# Patient Record
Sex: Female | Born: 1945
Health system: Southern US, Community
[De-identification: ages and names within clinical notes are randomized; demographics above are authoritative.]

## PROBLEM LIST (undated history)

## (undated) DIAGNOSIS — R351 Nocturia: Secondary | ICD-10-CM

## (undated) DIAGNOSIS — E669 Obesity, unspecified: Secondary | ICD-10-CM

## (undated) DIAGNOSIS — Z8719 Personal history of other diseases of the digestive system: Secondary | ICD-10-CM

## (undated) DIAGNOSIS — F419 Anxiety disorder, unspecified: Secondary | ICD-10-CM

## (undated) DIAGNOSIS — Z86718 Personal history of other venous thrombosis and embolism: Secondary | ICD-10-CM

## (undated) DIAGNOSIS — R922 Inconclusive mammogram: Secondary | ICD-10-CM

## (undated) DIAGNOSIS — D649 Anemia, unspecified: Secondary | ICD-10-CM

## (undated) DIAGNOSIS — D219 Benign neoplasm of connective and other soft tissue, unspecified: Secondary | ICD-10-CM

## (undated) DIAGNOSIS — M152 Bouchard's nodes (with arthropathy): Secondary | ICD-10-CM

## (undated) DIAGNOSIS — Z124 Encounter for screening for malignant neoplasm of cervix: Secondary | ICD-10-CM

## (undated) DIAGNOSIS — E119 Type 2 diabetes mellitus without complications: Secondary | ICD-10-CM

## (undated) DIAGNOSIS — M47816 Spondylosis without myelopathy or radiculopathy, lumbar region: Secondary | ICD-10-CM

## (undated) DIAGNOSIS — G4700 Insomnia, unspecified: Secondary | ICD-10-CM

## (undated) DIAGNOSIS — I10 Essential (primary) hypertension: Secondary | ICD-10-CM

## (undated) DIAGNOSIS — R002 Palpitations: Secondary | ICD-10-CM

## (undated) DIAGNOSIS — I739 Peripheral vascular disease, unspecified: Secondary | ICD-10-CM

## (undated) DIAGNOSIS — E785 Hyperlipidemia, unspecified: Secondary | ICD-10-CM

## (undated) HISTORY — DX: Obesity, unspecified: E66.9

## (undated) HISTORY — DX: Encounter for screening for malignant neoplasm of cervix: Z12.4

## (undated) HISTORY — DX: Personal history of other diseases of the digestive system: Z87.19

## (undated) HISTORY — DX: Palpitations: R00.2

## (undated) HISTORY — DX: Essential (primary) hypertension: I10

## (undated) HISTORY — PX: EYE SURGERY: SHX253

## (undated) HISTORY — DX: Inconclusive mammogram: R92.2

## (undated) HISTORY — DX: Peripheral vascular disease, unspecified: I73.9

## (undated) HISTORY — DX: Benign neoplasm of connective and other soft tissue, unspecified: D21.9

## (undated) HISTORY — DX: Nocturia: R35.1

## (undated) HISTORY — PX: COLONOSCOPY: SHX174

## (undated) HISTORY — DX: Anxiety disorder, unspecified: F41.9

## (undated) HISTORY — PX: ABDOMINAL HYSTERECTOMY: SHX81

## (undated) HISTORY — DX: Type 2 diabetes mellitus without complications: E11.9

## (undated) HISTORY — DX: Personal history of other venous thrombosis and embolism: Z86.718

## (undated) HISTORY — DX: Anemia, unspecified: D64.9

## (undated) HISTORY — PX: CHOLECYSTECTOMY: SHX55

## (undated) HISTORY — DX: Hyperlipidemia, unspecified: E78.5

## (undated) HISTORY — DX: Spondylosis without myelopathy or radiculopathy, lumbar region: M47.816

## (undated) HISTORY — PX: BREAST CYST ASPIRATION: SHX578

## (undated) HISTORY — DX: Insomnia, unspecified: G47.00

## (undated) HISTORY — PX: KNEE ARTHROSCOPY: SHX127

## (undated) HISTORY — DX: Bouchard's nodes (with arthropathy): M15.2

---

## 1992-10-18 DIAGNOSIS — Z86718 Personal history of other venous thrombosis and embolism: Secondary | ICD-10-CM

## 1992-10-18 HISTORY — DX: Personal history of other venous thrombosis and embolism: Z86.718

## 2005-11-11 LAB — HM COLONOSCOPY: HM Colonoscopy: NORMAL

## 2007-02-23 ENCOUNTER — Encounter: Admission: RE | Admit: 2007-02-23 | Discharge: 2007-02-23 | Payer: Self-pay | Admitting: Internal Medicine

## 2007-04-18 ENCOUNTER — Ambulatory Visit: Payer: Self-pay | Admitting: Internal Medicine

## 2007-04-18 LAB — CONVERTED CEMR LAB
Alkaline Phosphatase: 102 units/L (ref 39–117)
Basophils Absolute: 0 10*3/uL (ref 0.0–0.1)
Basophils Relative: 0.5 % (ref 0.0–1.0)
CO2: 30 meq/L (ref 19–32)
Chloride: 111 meq/L (ref 96–112)
Creatinine, Ser: 0.8 mg/dL (ref 0.4–1.2)
Direct LDL: 135.4 mg/dL
Eosinophils Absolute: 0.1 10*3/uL (ref 0.0–0.6)
Eosinophils Relative: 2.6 % (ref 0.0–5.0)
GFR calc non Af Amer: 78 mL/min
HCT: 37.5 % (ref 36.0–46.0)
Ketones, ur: NEGATIVE mg/dL
MCHC: 34 g/dL (ref 30.0–36.0)
MCV: 91.2 fL (ref 78.0–100.0)
Monocytes Absolute: 0.6 10*3/uL (ref 0.2–0.7)
Monocytes Relative: 10.4 % (ref 3.0–11.0)
Platelets: 252 10*3/uL (ref 150–400)
RDW: 13.3 % (ref 11.5–14.6)
Sodium: 143 meq/L (ref 135–145)
Specific Gravity, Urine: <=1.005 (ref 1.000–1.03)
Total CHOL/HDL Ratio: 4.4
Total Protein, Urine: NEGATIVE mg/dL
Total Protein: 7.5 g/dL (ref 6.0–8.3)
WBC: 5.5 10*3/uL (ref 4.5–10.5)

## 2007-05-08 ENCOUNTER — Ambulatory Visit: Payer: Self-pay | Admitting: Internal Medicine

## 2007-05-08 ENCOUNTER — Ambulatory Visit: Payer: Self-pay

## 2007-05-22 ENCOUNTER — Ambulatory Visit: Payer: Self-pay | Admitting: Internal Medicine

## 2007-05-22 LAB — CONVERTED CEMR LAB
ALT: 23 units/L (ref 0–35)
AST: 28 units/L (ref 0–37)
Cholesterol: 175 mg/dL (ref 0–200)
HDL: 46.8 mg/dL (ref >39.0)
Triglycerides: 132 mg/dL (ref 0–149)

## 2007-08-07 ENCOUNTER — Encounter: Payer: Self-pay | Admitting: Internal Medicine

## 2007-08-07 ENCOUNTER — Ambulatory Visit: Payer: Self-pay | Admitting: Internal Medicine

## 2007-08-07 DIAGNOSIS — M1710 Unilateral primary osteoarthritis, unspecified knee: Secondary | ICD-10-CM | POA: Insufficient documentation

## 2007-08-07 DIAGNOSIS — M76899 Other specified enthesopathies of unspecified lower limb, excluding foot: Secondary | ICD-10-CM

## 2007-08-07 DIAGNOSIS — M19049 Primary osteoarthritis, unspecified hand: Secondary | ICD-10-CM | POA: Insufficient documentation

## 2007-08-07 DIAGNOSIS — M479 Spondylosis, unspecified: Secondary | ICD-10-CM | POA: Insufficient documentation

## 2007-08-07 DIAGNOSIS — Z9889 Other specified postprocedural states: Secondary | ICD-10-CM

## 2007-08-07 DIAGNOSIS — IMO0002 Reserved for concepts with insufficient information to code with codable children: Secondary | ICD-10-CM | POA: Insufficient documentation

## 2007-08-07 LAB — CONVERTED CEMR LAB
ALT: 26 units/L (ref 0–35)
AST: 27 units/L (ref 0–37)
Alkaline Phosphatase: 99 units/L (ref 39–117)
Bilirubin, Direct: 0.1 mg/dL (ref 0.0–0.3)
Cholesterol: 196 mg/dL (ref 0–200)
HDL: 49.8 mg/dL (ref >39.0)
Total Bilirubin: 0.7 mg/dL (ref 0.3–1.2)
Total CHOL/HDL Ratio: 3.9
Total Protein: 7.6 g/dL (ref 6.0–8.3)
Triglycerides: 135 mg/dL (ref 0–149)

## 2007-08-08 DIAGNOSIS — E785 Hyperlipidemia, unspecified: Secondary | ICD-10-CM

## 2007-08-08 DIAGNOSIS — I10 Essential (primary) hypertension: Secondary | ICD-10-CM

## 2007-08-08 DIAGNOSIS — Z86718 Personal history of other venous thrombosis and embolism: Secondary | ICD-10-CM | POA: Insufficient documentation

## 2007-08-08 DIAGNOSIS — E1169 Type 2 diabetes mellitus with other specified complication: Secondary | ICD-10-CM | POA: Insufficient documentation

## 2007-09-28 ENCOUNTER — Ambulatory Visit: Payer: Self-pay | Admitting: Internal Medicine

## 2007-09-28 DIAGNOSIS — J069 Acute upper respiratory infection, unspecified: Secondary | ICD-10-CM | POA: Insufficient documentation

## 2007-09-28 DIAGNOSIS — S2000XA Contusion of breast, unspecified breast, initial encounter: Secondary | ICD-10-CM | POA: Insufficient documentation

## 2007-10-02 ENCOUNTER — Telehealth: Payer: Self-pay | Admitting: Internal Medicine

## 2007-10-04 ENCOUNTER — Ambulatory Visit: Payer: Self-pay | Admitting: Internal Medicine

## 2007-10-04 LAB — CONVERTED CEMR LAB
AST: 35 units/L (ref 0–37)
Bilirubin, Direct: 0.2 mg/dL (ref 0.0–0.3)
Direct LDL: 118.5 mg/dL
Total CHOL/HDL Ratio: 3.4
Triglycerides: 124 mg/dL (ref 0–149)

## 2007-12-11 ENCOUNTER — Encounter: Payer: Self-pay | Admitting: Internal Medicine

## 2007-12-14 ENCOUNTER — Encounter: Payer: Self-pay | Admitting: Internal Medicine

## 2007-12-14 ENCOUNTER — Encounter (INDEPENDENT_AMBULATORY_CARE_PROVIDER_SITE_OTHER): Payer: Self-pay | Admitting: *Deleted

## 2008-02-08 ENCOUNTER — Ambulatory Visit: Payer: Self-pay | Admitting: Internal Medicine

## 2008-02-08 DIAGNOSIS — J309 Allergic rhinitis, unspecified: Secondary | ICD-10-CM

## 2008-03-27 ENCOUNTER — Ambulatory Visit: Payer: Self-pay | Admitting: Internal Medicine

## 2008-03-27 DIAGNOSIS — IMO0001 Reserved for inherently not codable concepts without codable children: Secondary | ICD-10-CM

## 2008-03-27 DIAGNOSIS — M5412 Radiculopathy, cervical region: Secondary | ICD-10-CM | POA: Insufficient documentation

## 2008-05-10 ENCOUNTER — Telehealth (INDEPENDENT_AMBULATORY_CARE_PROVIDER_SITE_OTHER): Payer: Self-pay | Admitting: *Deleted

## 2008-06-07 LAB — CONVERTED CEMR LAB: Pap Smear: NORMAL

## 2008-08-09 ENCOUNTER — Ambulatory Visit: Payer: Self-pay | Admitting: Internal Medicine

## 2008-08-09 DIAGNOSIS — IMO0002 Reserved for concepts with insufficient information to code with codable children: Secondary | ICD-10-CM | POA: Insufficient documentation

## 2008-08-09 DIAGNOSIS — M549 Dorsalgia, unspecified: Secondary | ICD-10-CM | POA: Insufficient documentation

## 2008-08-09 DIAGNOSIS — M25519 Pain in unspecified shoulder: Secondary | ICD-10-CM | POA: Insufficient documentation

## 2008-08-09 DIAGNOSIS — E1169 Type 2 diabetes mellitus with other specified complication: Secondary | ICD-10-CM

## 2008-08-09 DIAGNOSIS — N959 Unspecified menopausal and perimenopausal disorder: Secondary | ICD-10-CM | POA: Insufficient documentation

## 2008-08-09 DIAGNOSIS — E669 Obesity, unspecified: Secondary | ICD-10-CM | POA: Insufficient documentation

## 2008-08-09 DIAGNOSIS — H9209 Otalgia, unspecified ear: Secondary | ICD-10-CM | POA: Insufficient documentation

## 2008-08-09 LAB — CONVERTED CEMR LAB
AST: 30 units/L (ref 0–37)
Alkaline Phosphatase: 110 units/L (ref 39–117)
BUN: 8 mg/dL (ref 6–23)
Chloride: 105 meq/L (ref 96–112)
Cholesterol: 185 mg/dL (ref 0–200)
Eosinophils Absolute: 0.2 10*3/uL (ref 0.0–0.7)
Eosinophils Relative: 3 % (ref 0.0–5.0)
GFR calc Af Amer: 93 mL/min
HDL: 47.9 mg/dL (ref >39.0)
Hemoglobin: 12.8 g/dL (ref 12.0–15.0)
Hgb A1c MFr Bld: 6.5 % — ABNORMAL HIGH (ref 4.6–6.0)
LDL Cholesterol: 106 mg/dL — ABNORMAL HIGH (ref 0–99)
Leukocytes, UA: NEGATIVE
MCV: 92.8 fL (ref 78.0–100.0)
Monocytes Relative: 9.6 % (ref 3.0–12.0)
Neutro Abs: 2.3 10*3/uL (ref 1.4–7.7)
Nitrite: NEGATIVE
Platelets: 205 10*3/uL (ref 150–400)
Potassium: 4 meq/L (ref 3.5–5.1)
RBC: 4 M/uL (ref 3.87–5.11)
RDW: 13.2 % (ref 11.5–14.6)
Total Bilirubin: 1 mg/dL (ref 0.3–1.2)
Total CHOL/HDL Ratio: 3.9
Total Protein, Urine: NEGATIVE mg/dL
Triglycerides: 157 mg/dL — ABNORMAL HIGH (ref 0–149)
VLDL: 31 mg/dL (ref 0–40)
WBC: 5.3 10*3/uL (ref 4.5–10.5)

## 2008-08-11 DIAGNOSIS — F411 Generalized anxiety disorder: Secondary | ICD-10-CM | POA: Insufficient documentation

## 2008-08-13 ENCOUNTER — Encounter: Payer: Self-pay | Admitting: Internal Medicine

## 2008-08-13 ENCOUNTER — Ambulatory Visit: Payer: Self-pay | Admitting: Family Medicine

## 2008-11-18 ENCOUNTER — Ambulatory Visit: Payer: Self-pay | Admitting: Internal Medicine

## 2008-11-18 ENCOUNTER — Ambulatory Visit: Payer: Self-pay

## 2008-11-18 DIAGNOSIS — M79609 Pain in unspecified limb: Secondary | ICD-10-CM | POA: Insufficient documentation

## 2008-12-06 ENCOUNTER — Ambulatory Visit: Payer: Self-pay | Admitting: Internal Medicine

## 2008-12-06 DIAGNOSIS — J019 Acute sinusitis, unspecified: Secondary | ICD-10-CM

## 2008-12-12 ENCOUNTER — Encounter: Payer: Self-pay | Admitting: Internal Medicine

## 2009-03-21 ENCOUNTER — Ambulatory Visit: Payer: Self-pay | Admitting: Internal Medicine

## 2009-03-27 ENCOUNTER — Encounter: Payer: Self-pay | Admitting: Internal Medicine

## 2009-03-27 ENCOUNTER — Ambulatory Visit: Payer: Self-pay

## 2009-04-24 ENCOUNTER — Ambulatory Visit: Payer: Self-pay | Admitting: Internal Medicine

## 2009-04-24 DIAGNOSIS — R079 Chest pain, unspecified: Secondary | ICD-10-CM

## 2009-04-24 DIAGNOSIS — R42 Dizziness and giddiness: Secondary | ICD-10-CM

## 2009-04-24 DIAGNOSIS — R21 Rash and other nonspecific skin eruption: Secondary | ICD-10-CM | POA: Insufficient documentation

## 2009-06-12 LAB — CONVERTED CEMR LAB: Pap Smear: NORMAL

## 2009-07-28 ENCOUNTER — Ambulatory Visit: Payer: Self-pay | Admitting: Internal Medicine

## 2009-07-28 DIAGNOSIS — H6690 Otitis media, unspecified, unspecified ear: Secondary | ICD-10-CM | POA: Insufficient documentation

## 2009-07-29 LAB — CONVERTED CEMR LAB
AST: 26 units/L (ref 0–37)
Albumin: 3.9 g/dL (ref 3.5–5.2)
Alkaline Phosphatase: 94 units/L (ref 39–117)
Bilirubin Urine: NEGATIVE
Bilirubin, Direct: 0 mg/dL (ref 0.0–0.3)
Calcium: 9.6 mg/dL (ref 8.4–10.5)
Cholesterol: 169 mg/dL (ref 0–200)
Eosinophils Absolute: 0.1 10*3/uL (ref 0.0–0.7)
Glucose, Bld: 130 mg/dL — ABNORMAL HIGH (ref 70–99)
HCT: 36.5 % (ref 36.0–46.0)
Hemoglobin: 12.4 g/dL (ref 12.0–15.0)
LDL Cholesterol: 91 mg/dL (ref 0–99)
Leukocytes, UA: NEGATIVE
Lymphocytes Relative: 39.9 % (ref 12.0–46.0)
Lymphs Abs: 1.9 10*3/uL (ref 0.7–4.0)
Monocytes Absolute: 0.4 10*3/uL (ref 0.1–1.0)
Monocytes Relative: 8.3 % (ref 3.0–12.0)
Neutro Abs: 2.4 10*3/uL (ref 1.4–7.7)
Platelets: 213 10*3/uL (ref 150.0–400.0)
RBC: 3.86 M/uL — ABNORMAL LOW (ref 3.87–5.11)
RDW: 13.4 % (ref 11.5–14.6)
Sodium: 142 meq/L (ref 135–145)
Total CHOL/HDL Ratio: 3
Total Protein, Urine: NEGATIVE mg/dL
Triglycerides: 136 mg/dL (ref 0.0–149.0)
Urobilinogen, UA: 0.2 (ref 0.0–1.0)
WBC: 4.8 10*3/uL (ref 4.5–10.5)

## 2009-07-30 LAB — CONVERTED CEMR LAB: Hgb A1c MFr Bld: 6.4 % (ref 4.6–6.5)

## 2009-07-31 ENCOUNTER — Ambulatory Visit: Payer: Self-pay | Admitting: Internal Medicine

## 2009-09-08 ENCOUNTER — Ambulatory Visit: Payer: Self-pay | Admitting: Internal Medicine

## 2009-09-08 DIAGNOSIS — H6590 Unspecified nonsuppurative otitis media, unspecified ear: Secondary | ICD-10-CM | POA: Insufficient documentation

## 2009-09-08 DIAGNOSIS — K5900 Constipation, unspecified: Secondary | ICD-10-CM | POA: Insufficient documentation

## 2009-09-08 DIAGNOSIS — R112 Nausea with vomiting, unspecified: Secondary | ICD-10-CM

## 2009-09-18 ENCOUNTER — Encounter: Payer: Self-pay | Admitting: Internal Medicine

## 2009-09-24 ENCOUNTER — Telehealth: Payer: Self-pay | Admitting: Internal Medicine

## 2009-09-29 ENCOUNTER — Ambulatory Visit: Payer: Self-pay | Admitting: Internal Medicine

## 2009-10-22 ENCOUNTER — Telehealth: Payer: Self-pay | Admitting: Internal Medicine

## 2009-11-11 ENCOUNTER — Encounter: Payer: Self-pay | Admitting: Internal Medicine

## 2009-11-12 ENCOUNTER — Encounter: Payer: Self-pay | Admitting: Internal Medicine

## 2009-11-20 ENCOUNTER — Encounter: Payer: Self-pay | Admitting: Internal Medicine

## 2009-11-25 ENCOUNTER — Encounter: Payer: Self-pay | Admitting: Internal Medicine

## 2009-12-02 ENCOUNTER — Encounter: Payer: Self-pay | Admitting: Internal Medicine

## 2009-12-12 ENCOUNTER — Encounter: Payer: Self-pay | Admitting: Internal Medicine

## 2009-12-22 ENCOUNTER — Ambulatory Visit: Payer: Self-pay | Admitting: Internal Medicine

## 2009-12-22 LAB — CONVERTED CEMR LAB
Creatinine, Ser: 0.73 mg/dL (ref 0.40–1.20)
Sodium: 139 meq/L (ref 135–145)

## 2009-12-29 ENCOUNTER — Ambulatory Visit: Payer: Self-pay | Admitting: Internal Medicine

## 2009-12-30 ENCOUNTER — Encounter: Payer: Self-pay | Admitting: Internal Medicine

## 2010-02-25 ENCOUNTER — Ambulatory Visit: Payer: Self-pay | Admitting: Family

## 2010-02-25 ENCOUNTER — Ambulatory Visit: Payer: Self-pay | Admitting: Diagnostic Radiology

## 2010-02-25 ENCOUNTER — Ambulatory Visit (HOSPITAL_BASED_OUTPATIENT_CLINIC_OR_DEPARTMENT_OTHER): Admission: RE | Admit: 2010-02-25 | Discharge: 2010-02-25 | Payer: Self-pay | Admitting: Internal Medicine

## 2010-02-25 DIAGNOSIS — J40 Bronchitis, not specified as acute or chronic: Secondary | ICD-10-CM | POA: Insufficient documentation

## 2010-03-05 ENCOUNTER — Ambulatory Visit: Payer: Self-pay | Admitting: Family

## 2010-03-30 ENCOUNTER — Ambulatory Visit: Payer: Self-pay | Admitting: Internal Medicine

## 2010-03-30 LAB — CONVERTED CEMR LAB
BUN: 11 mg/dL (ref 6–23)
CO2: 27 meq/L (ref 19–32)
Calcium: 9.8 mg/dL (ref 8.4–10.5)
Chloride: 100 meq/L (ref 96–112)
Creatinine, Ser: 0.85 mg/dL (ref 0.40–1.20)
Glucose, Bld: 117 mg/dL — ABNORMAL HIGH (ref 70–99)
Potassium: 4.4 meq/L (ref 3.5–5.3)

## 2010-03-31 ENCOUNTER — Telehealth: Payer: Self-pay | Admitting: Internal Medicine

## 2010-03-31 ENCOUNTER — Encounter: Payer: Self-pay | Admitting: Internal Medicine

## 2010-03-31 LAB — CONVERTED CEMR LAB
Cholesterol: 142 mg/dL (ref 0–200)
HDL: 60 mg/dL (ref >39)
Total CHOL/HDL Ratio: 2.4
VLDL: 21 mg/dL (ref 0–40)

## 2010-04-01 ENCOUNTER — Encounter: Payer: Self-pay | Admitting: Internal Medicine

## 2010-06-19 ENCOUNTER — Ambulatory Visit: Payer: Self-pay | Admitting: Internal Medicine

## 2010-06-19 LAB — CONVERTED CEMR LAB
BUN: 12 mg/dL (ref 6–23)
Calcium: 10.1 mg/dL (ref 8.4–10.5)
Creatinine, Ser: 0.84 mg/dL (ref 0.40–1.20)
Glucose, Bld: 99 mg/dL (ref 70–99)
Hgb A1c MFr Bld: 6 % — ABNORMAL HIGH (ref <5.7)

## 2010-06-19 LAB — HM DIABETES FOOT EXAM

## 2010-07-07 ENCOUNTER — Encounter: Payer: Self-pay | Admitting: Internal Medicine

## 2010-07-07 LAB — CONVERTED CEMR LAB: Pap Smear: NORMAL

## 2010-08-21 ENCOUNTER — Ambulatory Visit: Payer: Self-pay | Admitting: Internal Medicine

## 2010-08-21 DIAGNOSIS — L259 Unspecified contact dermatitis, unspecified cause: Secondary | ICD-10-CM

## 2010-08-21 DIAGNOSIS — M25569 Pain in unspecified knee: Secondary | ICD-10-CM

## 2010-08-24 ENCOUNTER — Telehealth: Payer: Self-pay | Admitting: Internal Medicine

## 2010-08-27 ENCOUNTER — Encounter: Payer: Self-pay | Admitting: Internal Medicine

## 2010-08-31 ENCOUNTER — Telehealth: Payer: Self-pay | Admitting: Internal Medicine

## 2010-09-04 ENCOUNTER — Encounter: Payer: Self-pay | Admitting: Internal Medicine

## 2010-09-04 LAB — HM DIABETES EYE EXAM: HM Diabetic Eye Exam: NORMAL

## 2010-09-07 ENCOUNTER — Encounter: Payer: Self-pay | Admitting: Internal Medicine

## 2010-09-07 ENCOUNTER — Ambulatory Visit: Payer: Self-pay | Admitting: Internal Medicine

## 2010-09-07 DIAGNOSIS — H269 Unspecified cataract: Secondary | ICD-10-CM

## 2010-10-01 ENCOUNTER — Ambulatory Visit: Payer: Self-pay | Admitting: Internal Medicine

## 2010-10-01 DIAGNOSIS — M25562 Pain in left knee: Secondary | ICD-10-CM

## 2010-10-06 ENCOUNTER — Telehealth: Payer: Self-pay | Admitting: Internal Medicine

## 2010-10-08 ENCOUNTER — Encounter: Payer: Self-pay | Admitting: Internal Medicine

## 2010-10-08 LAB — CONVERTED CEMR LAB
CO2: 25 meq/L (ref 19–32)
Calcium: 9.5 mg/dL (ref 8.4–10.5)
Hgb A1c MFr Bld: 6 % — ABNORMAL HIGH (ref <5.7)

## 2010-10-09 ENCOUNTER — Encounter: Payer: Self-pay | Admitting: Internal Medicine

## 2010-10-27 ENCOUNTER — Ambulatory Visit
Admission: RE | Admit: 2010-10-27 | Discharge: 2010-10-27 | Payer: Self-pay | Source: Home / Self Care | Attending: Internal Medicine | Admitting: Internal Medicine

## 2010-10-29 ENCOUNTER — Encounter: Payer: Self-pay | Admitting: Internal Medicine

## 2010-11-17 NOTE — Miscellaneous (Signed)
Summary: Mammogram  Clinical Lists Changes  Observations: Added new observation of MAMMOGRAM: normal (11/20/2009 9:31)        Preventive Care Screening  Mammogram:    Date:  11/20/2009    Results:  normal

## 2010-11-17 NOTE — Progress Notes (Signed)
Summary: Status Update  Phone Note Call from Patient Call back at Home Phone [phone]   Caller: Patient Summary of Call: patient states she was to be seen by dietician yesterday and she did not get the information packet that she said was supposed to be mailed, and did not have a contact number for her.She state she had missed the appointment because of illness.  She was seen at [facility] on Sunday and was given a Z pak and cough medication. Patient states she is feeling better and would like to know if she will need to follow up with Dr Artist Pais and what should she fo about the referral for the nutritiion appointment Initial call taken by: Glendell Docker CMA,  October 22, 2009 10:37 AM  Follow-up for Phone Call        please see if you can provide contact information for nutritionist.  If she is feeling better,  she does not need f/u appt for URI Follow-up by: [name] DO,  October 22, 2009 12:17 PM  Additional Follow-up for Phone Call Additional follow up Details #1::        Patient's appt  rescheduled to Jan 25th Additional Follow-up by: Darral Dash,  October 30, 2009 9:50 AM

## 2010-11-17 NOTE — Assessment & Plan Note (Signed)
Summary: 3 MONTH FOLLOW UP/MHF--Rm 3   Vital Signs:  Patient profile:   65 year old female Height:      61 inches Weight:      177 pounds BMI:     33.56 Temp:     98.3 degrees F oral Pulse rate:   84 / minute Pulse rhythm:   regular Resp:     16 per minute BP sitting:   122 / 70  (right arm) Cuff size:   large  Vitals Entered By: Mervin Kung CMA (March 30, 2010 8:12 AM) CC: , , Type 2 diabetes mellitus follow-up Is Patient Diabetic? Yes Comments Pt is not taking Tussionex--completed regimen.   Primary Care Provider:  Dondra Spry DO  CC:  , , and Type 2 diabetes mellitus follow-up.  History of Present Illness:  Type 2 Diabetes Mellitus Follow-Up      This is a 65 year old woman who presents for Type 2 diabetes mellitus follow-up.  The patient denies weight gain.  The patient denies the following symptoms: chest pain.  Since the last visit the patient reports good dietary compliance, compliance with medications, exercising regularly, and monitoring blood glucose.    bronchitis - resolved  Allergies (verified): No Known Drug Allergies  Past History:  Past Medical History: DVT, hx of Hyperlipidemia Hypertension  lumbar djd  knee djd  hand djd Allergic rhinitis Diabetes mellitus, type II - diet Anxiety  Past Surgical History: knee arthoscopy Hysterectomy Cholecystectomy      Family History: Family History Breast cancer 1st degree relative  - sister at 2 yo Family History of Stroke M 1st degree relative     Physical Exam  General:  alert, well-developed, and well-nourished.   Neck:  No deformities, masses, or tenderness noted. Lungs:  normal respiratory effort, normal breath sounds, no crackles, and no wheezes.   Heart:  normal rate, regular rhythm, and no gallop.   Extremities:  No lower extremity edema  Neurologic:  cranial nerves II-XII intact and gait normal.    Diabetes Management Exam:    Foot Exam (with socks and/or shoes not present):      Inspection:          Left foot: normal          Right foot: normal   Impression & Recommendations:  Problem # 1:  DIABETES MELLITUS, TYPE II (ICD-250.00) Assessment Improved good med and dietary compliance.  Maintain current medication regimen.  Her updated medication list for this problem includes:    Ecotrin Low Strength 81 Mg Tbec (Aspirin) .Marland Kitchen... Take 1 tablet by mouth once a day    Janumet 50-500 Mg Tabs (Sitagliptin-metformin hcl) .Marland Kitchen... Take one by mouth bid  Orders: T-Basic Metabolic Panel [phone]) T- Hemoglobin A1C [phone])  Labs Reviewed: Creat: 0.73 (12/22/2009)     Last Eye Exam: normal (11/12/2009) Reviewed HgBA1c results: 6.9 (12/22/2009)  6.4 (07/28/2009)  Problem # 2:  HYPERTENSION (ICD-401.9) bp is at goal.  Maintain current medication regimen.  Her updated medication list for this problem includes:    Amlodipine Besylate 5 Mg Tabs (Amlodipine besylate) .Marland Kitchen... 1 by mouth once daily  BP today: 122/70 Prior BP: 142/78 (03/05/2010)  Labs Reviewed: K+: 4.1 (12/22/2009) Creat: : 0.73 (12/22/2009)   Chol: 169 (07/28/2009)   HDL: 50.60 (07/28/2009)   LDL: 91 (07/28/2009)   TG: 136.0 (07/28/2009)  Complete Medication List: 1)  Amlodipine Besylate 5 Mg Tabs (Amlodipine besylate) .Marland Kitchen.. 1 by mouth once daily 2)  Ecotrin Low Strength  81 Mg Tbec (Aspirin) .... Take 1 tablet by mouth once a day 3)  Accu-chek Aviva Strp (Glucose blood) .... Test once daily 4)  Accu-chek Multiclix Lancets Misc (Lancets) .... Use as directed 5)  Janumet 50-500 Mg Tabs (Sitagliptin-metformin hcl) .... Take one by mouth bid 6)  Ibuprofen 200 Mg Tabs (Ibuprofen) .... As needed for back pain. 7)  Vitamin D 1000 Unit Caps (Cholecalciferol) .... One by mouth once daily 8)  Simvastatin 20 Mg Tabs (Simvastatin) .... One by mouth once daily  Patient Instructions: 1)  Please schedule a follow-up appointment in 3 months.    Vital Signs:  Patient Profile:   65 year old  female Height:     61 inches Weight:      177 pounds BMI:     33.56 Temp:     98.3 degrees F oral Pulse rate:   84 / minute Pulse rhythm:   regular Resp:     16 per minute BP sitting:   122 / 70 Cuff size:   large                 Current Allergies (reviewed today): No known allergies

## 2010-11-17 NOTE — Assessment & Plan Note (Signed)
Summary: 1 WK F/U/HEA   Vital Signs:  Patient profile:   65 year old female Height:      61 inches Weight:      178.50 pounds BMI:     33.85 Temp:     98.0 degrees F oral Pulse rate:   60 / minute Pulse rhythm:   regular Resp:     16 per minute BP sitting:   142 / 78  (right arm) Cuff size:   large  Vitals Entered By: Heather Roberts CMA (Mar 05, 2010 8:06 AM) CC: room 4  1 week f/u.  Cough seems better but still doesn't have energy. Is Patient Diabetic? Yes   Primary Care Provider:  Dondra Spry DO  CC:  room 4  1 week f/u.  Cough seems better but still doesn't have energy.Marland Kitchen  History of Present Illness: Heather Roberts is a 65 year old female who presents today for folllow up of her bronchitis.  Last visit she had a chest x-ray which was negative.  She was treated with Z-pak and tussionex.  Overall she feels improved.  She continues to cough but notes that this is improved since last visit.  Has some pleuritic sorness from cough.  Energy still has not returned.  Phegm which was green/yellow initially has returned to a clear color.    Allergies (verified): No Known Drug Allergies  Physical Exam  General:  Well-developed,well-nourished,in no acute distress; alert,appropriate and cooperative throughout examination Head:  Normocephalic and atraumatic without obvious abnormalities. No apparent alopecia or balding. Ears:  External ear exam shows no significant lesions or deformities.  Otoscopic examination reveals clear canals, tympanic membranes are intact bilaterally without bulging, retraction, inflammation or discharge. Hearing is grossly normal bilaterally. Mouth:  Oral mucosa and oropharynx without lesions or exudates.  Teeth in good repair. Neck:  No deformities, masses, or tenderness noted. Lungs:  some coarse upper airway rhonchi, now wheezes or rales noted. Heart:  Normal rate and regular rhythm. S1 and S2 normal without gallop, murmur, click, rub or other extra  sounds.   Impression & Recommendations:  Problem # 1:  BRONCHITIS (ICD-490) Assessment Improved Clinically improving.  Continue as needed tussionex.  Patient to call if symptoms worsen. The following medications were removed from the medication list:    Zithromax Z-pak 250 Mg Tabs (Azithromycin) .Marland Kitchen... 2 tabs by mouth today, then one tablet by mouth daily x 4 more days Her updated medication list for this problem includes:    Tussionex Pennkinetic Er 8-10 Mg/56ml Lqcr (Chlorpheniramine-hydrocodone) ..... One teaspoon every 12 hours as needed for cough  Complete Medication List: 1)  Amlodipine Besylate 5 Mg Tabs (Amlodipine besylate) .Marland Kitchen.. 1 by mouth once daily 2)  Ecotrin Low Strength 81 Mg Tbec (Aspirin) .... Take 1 tablet by mouth once a day 3)  Simvastatin 80 Mg Tabs (Simvastatin) .Marland Kitchen.. 1 by mouth once daily 4)  Oscal 500/200 D-3 500-200 Mg-unit Tabs (Calcium-vitamin d) .... Take 1 tablet by mouth once a day 5)  Accu-chek Aviva Strp (Glucose blood) .... Test once daily 6)  Accu-chek Multiclix Lancets Misc (Lancets) .... Use as directed 7)  Janumet 50-500 Mg Tabs (Sitagliptin-metformin hcl) .... Take one by mouth bid 8)  Tussionex Pennkinetic Er 8-10 Mg/55ml Lqcr (Chlorpheniramine-hydrocodone) .... One teaspoon every 12 hours as needed for cough 9)  Ibuprofen 200 Mg Tabs (Ibuprofen) .... As needed for back pain.  Patient Instructions: 1)  You should continue to improve each day. 2)  Please call if your cough  and energy does not continue to improve, or if you develop weakness or fever.   Current Allergies (reviewed today): No known allergies

## 2010-11-17 NOTE — Assessment & Plan Note (Signed)
Summary: talk about eye surgery/mhf   Vital Signs:  Patient profile:   65 year old female Height:      61 inches Weight:      174.25 pounds BMI:     33.04 O2 Sat:      99 % Temp:     98.3 degrees F oral Pulse rate:   64 / minute BP sitting:   110 / 68  (right arm) Cuff size:   large  Vitals Entered By: Glendell Docker CMA (September 07, 2010 8:06 AM) CC: Discuss Eye Exam Is Patient Diabetic? Yes Pain Assessment Patient in pain? no       Does patient need assistance? Functional Status Cook/clean Comments had eye exam on Friday and she was advised that she need to bilateral cataract surgery, wiith her right eye being the worse   Primary Care Provider:  Dondra Spry DO  CC:  Discuss Eye Exam.  History of Present Illness: 65 y/o AA female for routine f/u Dr. Rollen Sox planning cataract surgery pt very anxious about eye surgery opthalmogy notes suggest glaucoma suspect  htn - stable  Preventive Screening-Counseling & Management  Alcohol-Tobacco     Smoking Status: never  Allergies (verified): No Known Drug Allergies  Past History:  Past Medical History: DVT, hx of Hyperlipidemia   Hypertension   lumbar djd   knee djd   hand djd Allergic rhinitis Diabetes mellitus, type II - diet Anxiety Glaucoma  Family History: Family History Breast cancer 1st degree relative  - sister at 40 yo Family History of Stroke M 1st degree relative        Social History: Former Smoker Alcohol use-no  Married   3 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   Lungs:  normal respiratory effort and normal breath sounds.   Heart:  normal rate, regular rhythm, and no gallop.   Extremities:  No lower extremity edema    Impression & Recommendations:  Problem # 1:  HYPERTENSION (ICD-401.9) Assessment Improved  Her updated medication list for this problem includes:    Amlodipine  Besylate 2.5 Mg Tabs (Amlodipine besylate) ..... One by mouth once daily    Losartan Potassium 25 Mg Tabs (Losartan potassium) ..... One by mouth once daily  BP today: 110/68 Prior BP: 130/70 (08/21/2010)  Labs Reviewed: K+: 4.8 (06/19/2010) Creat: : 0.84 (06/19/2010)   Chol: 142 (03/31/2010)   HDL: 60 (03/31/2010)   LDL: 61 (03/31/2010)   TG: 105 (03/31/2010)  Problem # 2:  DIABETES MELLITUS, TYPE II (ICD-250.00) Assessment: Unchanged  Her updated medication list for this problem includes:    Ecotrin Low Strength 81 Mg Tbec (Aspirin) .Marland Kitchen... Take 1 tablet by mouth once a day    Janumet 50-500 Mg Tabs (Sitagliptin-metformin hcl) .Marland Kitchen... Take one by mouth bid    Losartan Potassium 25 Mg Tabs (Losartan potassium) ..... One by mouth once daily  Labs Reviewed: Creat: 0.84 (06/19/2010)     Last Eye Exam: normal (11/12/2009) Reviewed HgBA1c results: 6.0 (06/19/2010)  6.1 (03/30/2010)  Problem # 3:  CATARACTS (ICD-366.9) pt reports she was diagnosed with cataracts she is worried about eye surgery opthalmology notes suggest glaucoma suspect  pt cleared medically for eye surgery EKG is normal  Complete Medication List: 1)  Amlodipine Besylate 2.5 Mg Tabs (Amlodipine besylate) .... One by mouth once daily 2)  Ecotrin Low Strength 81 Mg Tbec (Aspirin) .... Take 1 tablet by  mouth once a day 3)  Accu-chek Aviva Strp (Glucose blood) .... Test once daily 4)  Accu-chek Multiclix Lancets Misc (Lancets) .... Use as directed 5)  Janumet 50-500 Mg Tabs (Sitagliptin-metformin hcl) .... Take one by mouth bid 6)  Ibuprofen 200 Mg Tabs (Ibuprofen) .... As needed for back pain. 7)  Vitamin D 1000 Unit Caps (Cholecalciferol) .... One by mouth once daily 8)  Simvastatin 10 Mg Tabs (Simvastatin) .... One by mouth qpm 9)  Losartan Potassium 25 Mg Tabs (Losartan potassium) .... One by mouth once daily 10)  Triamcinolone Acetonide 0.1 % Crea (Triamcinolone acetonide) .... Apply two times a day as  directed 11)  Tramadol Hcl 50 Mg Tabs (Tramadol hcl) .... One by mouth once daily as needed knee pain  Other Orders: EKG w/ Interpretation (93000)  Patient Instructions: 1)  Keep your next follow up appointment 2)  Stop taking aspirin 1 week before cataract surgery 3)  Do not take any NSAIDs   Orders Added: 1)  EKG w/ Interpretation [93000] 2)  Est. Patient Level III [40981]     Current Allergies (reviewed today): No known allergies

## 2010-11-17 NOTE — Assessment & Plan Note (Signed)
Summary: 3 MONTH FOLLOW UP/MHF   Vital Signs:  Patient profile:   65 year old female Height:      61 inches Weight:      179 pounds BMI:     33.94 O2 Sat:      97 % on Room air Temp:     98.5 degrees F oral Pulse rate:   66 / minute Pulse rhythm:   regular Resp:     18 per minute BP sitting:   120 / 72  (right arm) Cuff size:   large  Vitals Entered By: Glendell Docker CMA (June 19, 2010 7:52 AM)  O2 Flow:  Room air CC: 3 Month Follow up  Is Patient Diabetic? Yes Pain Assessment Patient in pain? no        Primary Care Provider:  Dondra Spry DO  CC:  3 Month Follow up .  History of Present Illness: 65 y/o AA female for f/u for DM II f/u low 89 high avg 110-114 some crawling sensation in her feet.  she has been more active  changed milk to 1% and experienced more stomach upset she was prev using Silk (soy milk)  Preventive Screening-Counseling & Management  Alcohol-Tobacco     Smoking Status: never  Allergies (verified): No Known Drug Allergies  Past History:  Past Medical History: DVT, hx of Hyperlipidemia  Hypertension  lumbar djd  knee djd   hand djd Allergic rhinitis Diabetes mellitus, type II - diet Anxiety  Past Surgical History: knee arthoscopy Hysterectomy  Cholecystectomy       Family History: Family History Breast cancer 1st degree relative  - sister at 42 yo Family History of Stroke M 1st degree relative       Social History: Former Smoker Alcohol use-no  Married  3 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   Lungs:  normal respiratory effort, normal breath sounds, no crackles, and no wheezes.   Heart:  normal rate, regular rhythm, and no gallop.   Pulses:  dorsalis pedis and posterior tibial pulses are full and equal bilaterally Extremities:  No lower extremity edema  Neurologic:  cranial nerves II-XII intact and gait  normal.    Diabetes Management Exam:    Foot Exam (with socks and/or shoes not present):       Inspection:          Left foot: normal          Right foot: normal   Impression & Recommendations:  Problem # 1:  DIABETES MELLITUS, TYPE II (ICD-250.00) Assessment Unchanged encouraged more wt loss thru exercise / walking program.  she uses pedometer  Her updated medication list for this problem includes:    Ecotrin Low Strength 81 Mg Tbec (Aspirin) .Marland Kitchen... Take 1 tablet by mouth once a day    Janumet 50-500 Mg Tabs (Sitagliptin-metformin hcl) .Marland Kitchen... Take one by mouth bid    Losartan Potassium 25 Mg Tabs (Losartan potassium) ..... One by mouth once daily  Orders: T-Basic Metabolic Panel [phone]) T- Hemoglobin A1C ([id number])  Problem # 2:  HYPERTENSION (ICD-401.9) add ARB for renal protection  Her updated medication list for this problem includes:    Amlodipine Besylate 2.5 Mg Tabs (Amlodipine besylate) ..... One by mouth once daily    Losartan Potassium 25 Mg Tabs (Losartan potassium) ..... One by mouth once daily  Complete Medication List: 1)  Amlodipine Besylate 2.5  Mg Tabs (Amlodipine besylate) .... One by mouth once daily 2)  Ecotrin Low Strength 81 Mg Tbec (Aspirin) .... Take 1 tablet by mouth once a day 3)  Accu-chek Aviva Strp (Glucose blood) .... Test once daily 4)  Accu-chek Multiclix Lancets Misc (Lancets) .... Use as directed 5)  Janumet 50-500 Mg Tabs (Sitagliptin-metformin hcl) .... Take one by mouth bid 6)  Ibuprofen 200 Mg Tabs (Ibuprofen) .... As needed for back pain. 7)  Vitamin D 1000 Unit Caps (Cholecalciferol) .... One by mouth once daily 8)  Simvastatin 10 Mg Tabs (Simvastatin) .... One by mouth qpm 9)  Losartan Potassium 25 Mg Tabs (Losartan potassium) .... One by mouth once daily  Other Orders: Influenza Vaccine NON MCR (40347) Admin 1st Vaccine (42595)  Patient Instructions: 1)  Please schedule a follow-up appointment in 2  months. Prescriptions: SIMVASTATIN 10 MG TABS (SIMVASTATIN) one by mouth qpm  #30 x 3   Entered and Authorized by:   [name] DO   Signed by:   [name] DO on 06/19/2010   Method used:   Electronically to        [facility] [id number]* (retail)       [address] [place], [place]  [address]       Ph: [phone]       Fax: [phone]   RxID:   [id number] LOSARTAN POTASSIUM 25 MG TABS (LOSARTAN POTASSIUM) one by mouth once daily  #30 x 3   Entered and Authorized by:   [name] DO   Signed by:   [name] DO on 06/19/2010   Method used:   Electronically to        [facility] # [id number]* (retail)       [address] [place], [place]  [address]       Ph: [phone]       Fax: [phone]   RxID:   [id number] AMLODIPINE BESYLATE 2.5 MG TABS (AMLODIPINE BESYLATE) one by mouth once daily  #30 x 3   Entered and Authorized by:   [name] DO   Signed by:   [name] DO on 06/19/2010   Method used:   Electronically to        [facility] # [id number]* (retail)       [address] [place], [place]  [address]       Ph: [phone]       Fax: [phone]   RxID:   [id number]    Orders Added: 1)  Influenza Vaccine NON MCR [00028] 2)  Admin 1st Vaccine [90471] 3)  T-Basic Metabolic Panel [phone] 4)  T- Hemoglobin A1C [83036-23375] 5)  Est. Patient Level III [58099]   Current Allergies (reviewed today): No known allergies    Immunizations Administered:  Influenza Vaccine # 1:    Vaccine Type: Fluvax Non-MCR    Site: left deltoid    Mfr: GlaxoSmithKline    Dose: 0.5 ml    Route: IM    Given by: Glendell Docker CMA    Exp. Date: 04/17/2011    Lot #: [id number]  Flu Vaccine Consent Questions:    Do you have a history of  severe allergic reactions to this vaccine? no    Any prior history of allergic reactions to egg and/or gelatin?  no    Do you have a sensitivity to the preservative Thimersol? no    Do you have a past history of Guillan-Barre Syndrome? no    Do you currently have an acute febrile illness? no    Have you ever had a severe reaction to latex? no    Vaccine information given and explained to patient? yes    Are you currently pregnant? no

## 2010-11-17 NOTE — Letter (Signed)
   [name] [address] [place], [place]  [address]  [place] Phone: [phone]      June 19, 2010   Heather Roberts [address] [place], [place] [address]  RE:  LAB RESULTS  Dear  [name],  The following is an interpretation of your most recent lab tests.  Please take note of any instructions provided or changes to medications that have resulted from your lab work.  ELECTROLYTES:  Good - no changes needed  KIDNEY FUNCTION TESTS:  Good - no changes needed    DIABETIC STUDIES:  Good - no changes needed, Improved - continue management Blood Glucose: 117   HgbA1C: 6.1   Microalbumin/Creatinine Ratio: 5.8      Sincerely Yours,    Dr. Thomos Lemons

## 2010-11-17 NOTE — Miscellaneous (Signed)
Summary: Eye exam  Clinical Lists Changes  Observations: Added new observation of DMEYEEXAMNXT: 09/2011 (09/07/2010 13:33) Added new observation of DMEYEEXMRES: no diabetic retinopathy. Glaucoma bilaterally (09/04/2010 13:34) Added new observation of EYE EXAM BY: [facility]  (09/04/2010 13:34) Added new observation of DIAB EYE EX: no diabetic retinopathy. Glaucoma bilaterally (09/04/2010 13:34)        Diabetes Management Exam:    Eye Exam:       Eye Exam done elsewhere          Date: 09/04/2010          Results: no diabetic retinopathy. Glaucoma bilaterally          Done by: [facility]

## 2010-11-17 NOTE — Assessment & Plan Note (Signed)
Summary: 3 MONTH FOLLOW UP/MHF   Vital Signs:  Patient profile:   65 year old female Weight:      185.25 pounds BMI:     35.13 O2 Sat:      97 % on Room air Temp:     97.9 degrees F oral Pulse rate:   70 / minute Pulse rhythm:   regular Resp:     16 per minute BP sitting:   112 / 72  (right arm) Cuff size:   large  Vitals Entered By: Glendell Docker CMA (December 29, 2009 9:08 AM)  O2 Flow:  Room air CC: Rm 3-3 Month Follow up disease management Is Patient Diabetic? Yes   Primary Care Provider:  DThomos Lemons DO  CC:  Rm 3-3 Month Follow up disease management.  History of Present Illness: 65 y/o AA female for DM II f/u good med compliance,  fair dietary compliance, wt is stable low blood sugar 98 high 178 avg 121-131, blood sugar this am 131  left wrist ganglion cyst - no pain or discomfort  Allergies (verified): No Known Drug Allergies  Past History:  Past Medical History: DVT, hx of Hyperlipidemia Hypertension  lumbar djd  knee djd hand djd Allergic rhinitis Diabetes mellitus, type II - diet Anxiety  Past Surgical History: knee arthoscopy Hysterectomy Cholecystectomy     Family History: Family History Breast cancer 1st degree relative  - sister at 53 yo Family History of Stroke M 1st degree relative    Social History: Former Smoker Alcohol use-no  Married 3 children homemaker  - 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   Neck:  supple and no carotid bruits.   Lungs:  normal respiratory effort, normal breath sounds, and no wheezes.   Heart:  normal rate, regular rhythm, no murmur, and no gallop.   Msk:  < 1cm ganglion cyst left wrist,  non tender Extremities:  No lower extremity edema   Diabetes Management Exam:    Foot Exam (with socks and/or shoes not present):       Inspection:          Left foot: normal          Right foot:  normal   Impression & Recommendations:  Problem # 1:  DIABETES MELLITUS, TYPE II (ICD-250.00) Assessment Deteriorated A1c worse.  Pt unable to control DM with diet alone.  add janumet.  Pt counseled on diet and exercise.  Her updated medication list for this problem includes:    Ecotrin 325 Mg Tbec (Aspirin) .Marland Kitchen... 1 by mouth once daily    Janumet 50-500 Mg Tabs (Sitagliptin-metformin hcl) .Marland Kitchen... 1/2 tab by mouth two times a day x 7 days, then one by mouth bid  Problem # 2:  HYPERTENSION (ICD-401.9) stable.  Maintain current medication regimen.  Her updated medication list for this problem includes:    Amlodipine Besylate 5 Mg Tabs (Amlodipine besylate) .Marland Kitchen... 1 by mouth once daily  BP today: 112/72 Prior BP: 124/70 (09/29/2009)  Labs Reviewed: K+: 4.1 (12/22/2009) Creat: : 0.73 (12/22/2009)   Chol: 169 (07/28/2009)   HDL: 50.60 (07/28/2009)   LDL: 91 (07/28/2009)   TG: 136.0 (07/28/2009)  Complete Medication List: 1)  Amlodipine Besylate 5 Mg Tabs (Amlodipine besylate) .Marland Kitchen.. 1 by mouth once daily 2)  Ecotrin 325 Mg Tbec (Aspirin) .Marland Kitchen.. 1 by mouth once daily 3)  Tramadol Hcl 50 Mg Tabs (Tramadol hcl) .Marland Kitchen.. 1 by mouth four times per day as needed pain 4)  Simvastatin 80 Mg Tabs (Simvastatin) .Marland Kitchen.. 1 by mouth once daily 5)  Oscal 500/200 D-3 500-200 Mg-unit Tabs (Calcium-vitamin d) 6)  Accu-chek Aviva Strp (Glucose blood) .... Test once daily 7)  Accu-chek Multiclix Lancets Misc (Lancets) .... Use as directed 8)  Cetirizine Hcl 10 Mg Tabs (Cetirizine hcl) .... Take 1 tablet by mouth once a day 9)  Janumet 50-500 Mg Tabs (Sitagliptin-metformin hcl) .... 1/2 tab by mouth two times a day x 7 days, then one by mouth bid  Patient Instructions: 1)  Please schedule a follow-up appointment in 3 months. 2)  BMP prior to visit, ICD-9: 250.00 3)  HbgA1C prior to visit, ICD-9: 250.00 4)  Please return for lab work one (1) week before your next appointment.  Prescriptions: JANUMET 50-500 MG TABS  (SITAGLIPTIN-METFORMIN HCL) 1/2 tab by mouth two times a day x 7 days, then one by mouth bid  #60 x 3   Entered and Authorized by:   [name] DO   Signed by:   [name] DO on 12/29/2009   Method used:   Electronically to        [facility] # [id number]* (retail)       [address] [place], [place]  [address]       Ph: [phone]       Fax: [phone]   RxID:   [id number]   Current Allergies (reviewed today): No known allergies

## 2010-11-17 NOTE — Consult Note (Signed)
Summary: [facility]  [facility]   Imported By: Lanelle Bal 11/20/2009 12:01:58  _____________________________________________________________________  External Attachment:    Type:   Image     Comment:   External Document

## 2010-11-17 NOTE — Letter (Signed)
Summary: Generic Letter  [place] at [facility]  [address]   [place], [place] [address]   Phone: [phone]  Fax: [phone]     08/27/2010    [facility] [address] [place], [place]  [address]   Dear [name],  We have some information to share with you and have been unable to reach you be phone.   Please call our office at [phone] Monday through Friday from 8am to 5pm. Please provide [place] with an updated telephone number if one is available.  Sincerely,    Mervin Kung CMA (AAMA)

## 2010-11-17 NOTE — Progress Notes (Signed)
Summary: Pain Medication  Phone Note Call from Patient Call back at Home Phone [phone]   Caller: Patient Call For: [name] DO Summary of Call: Patient called and left voice message requesting a rx for pain. She states she is still having leg pain from her hip to her knee Initial call taken by: Glendell Docker CMA,  August 31, 2010 12:54 PM  Follow-up for Phone Call        see rx for tramadol.   if persistent pain - pt needs OV Follow-up by: [name] DO,  August 31, 2010 1:37 PM  Additional Follow-up for Phone Call Additional follow up Details #1::        attempted to contact patient at [phone], no answer, a fax tone reached Additional Follow-up by: Glendell Docker CMA,  August 31, 2010 5:04 PM    Additional Follow-up for Phone Call Additional follow up Details #2::    attempted to contact patient at [phone], no answer, a fax tone reached Glendell Docker CMA  September 01, 2010 11:52 AM     Additional Follow-up for Phone Call Additional follow up Details #3:: Details for Additional Follow-up Action Taken: no return call from patient regarding phone message  Glendell Docker CMA  September 02, 2010 9:23 AM   New/Updated Medications: TRAMADOL HCL 50 MG TABS (TRAMADOL HCL) one by mouth once daily as needed knee pain Prescriptions: TRAMADOL HCL 50 MG TABS (TRAMADOL HCL) one by mouth once daily as needed knee pain  #15 x 0   Entered and Authorized by:   [name] DO   Signed by:   [name] DO on 08/31/2010   Method used:   Electronically to        [facility] # [phone]* (retail)       [address] [place], [place]  [address]       Ph: [phone]       Fax: [phone]   RxID:   [id number]

## 2010-11-17 NOTE — Letter (Signed)
   [place] at [facility] [address] [place], [place]  [address]  [place] Phone: [phone]      April 01, 2010   Heather Roberts [address] [place], [place] [address]  RE:  LAB RESULTS  Dear  [name],  The following is an interpretation of your most recent lab tests.  Please take note of any instructions provided or changes to medications that have resulted from your lab work.  LIVER FUNCTION TESTS:  Stable - no changes needed  LIPID PANEL:  Good - no changes needed Triglyceride: 105   Cholesterol: 142   LDL: 61   HDL: 60   Chol/HDL%:  2.4 Ratio         Sincerely Yours,    Dr. Thomos Lemons

## 2010-11-17 NOTE — Letter (Signed)
Summary: Heather Roberts   Imported By: Heather Roberts 11/20/2009 11:30:21  _____________________________________________________________________  External Attachment:    Type:   Image     Comment:   External Document

## 2010-11-17 NOTE — Assessment & Plan Note (Signed)
Summary: 2 month follow up/mhf   Vital Signs:  Patient profile:   65 year old female Height:      61 inches Weight:      177.50 pounds BMI:     33.66 O2 Sat:      98 % on Room air Temp:     98.6 degrees F oral Pulse rate:   67 / minute Pulse rhythm:   regular Resp:     18 per minute BP sitting:   130 / 70  (right arm) Cuff size:   large  Vitals Entered By: Glendell Docker CMA (August 21, 2010 7:58 AM)  O2 Flow:  Room air CC: 2 Month follow up Is Patient Diabetic? Yes Did you bring your meter with you today? No Pain Assessment Patient in pain? no        Primary Care Provider:  Dondra Spry DO  CC:  2 Month follow up.  History of Present Illness: 65 y/o AA female for f/u  DM II - good dietary compliance.  0low blood sugar 88 high 122.  exercising regularly Yoga poses,left leg buring and knee gave out during session  patches of dry skin on right leg  htn - mild dizziness when she started losartan - now much better  Preventive Screening-Counseling & Management  Alcohol-Tobacco     Smoking Status: never  Allergies (verified): No Known Drug Allergies  Past History:  Past Medical History: DVT, hx of Hyperlipidemia  Hypertension   lumbar djd  knee djd   hand djd Allergic rhinitis Diabetes mellitus, type II - diet Anxiety  Physical Exam  General:  alert, well-developed, and well-nourished.   Ears:  R ear normal and L ear normal.   Lungs:  normal respiratory effort, normal breath sounds, no crackles, and no wheezes.   Heart:  normal rate, regular rhythm, and no gallop.   Msk:  left knee - no joint tenderness, no joint deformities, and no joint instability.  slight crepitus with extension mild left quad atrophy Neurologic:  cranial nerves II-XII intact and gait normal.   Skin:  dry patch right thigh   Impression & Recommendations:  Problem # 1:  PATELLO-FEMORAL SYNDROME (ICD-719.46) Assessment New left knee is occ giving out.  her symptoms started  with certain yoga poses.   pt to perform leg extension / quad exercises.   if no improvement,  consider PT eval  Her updated medication list for this problem includes:    Ecotrin Low Strength 81 Mg Tbec (Aspirin) .Marland Kitchen... Take 1 tablet by mouth once a day    Ibuprofen 200 Mg Tabs (Ibuprofen) .Marland Kitchen... As needed for back pain.  Problem # 2:  HYPERTENSION (ICD-401.9) Assessment: Unchanged  Her updated medication list for this problem includes:    Amlodipine Besylate 2.5 Mg Tabs (Amlodipine besylate) ..... One by mouth once daily    Losartan Potassium 25 Mg Tabs (Losartan potassium) ..... One by mouth once daily  BP today: 130/70 Prior BP: 120/72 (06/19/2010)  Labs Reviewed: K+: 4.8 (06/19/2010) Creat: : 0.84 (06/19/2010)   Chol: 142 (03/31/2010)   HDL: 60 (03/31/2010)   LDL: 61 (03/31/2010)   TG: 105 (03/31/2010)  Problem # 3:  ECZEMA (ICD-692.9) dry patch on right thigh.  use crm as directed  Her updated medication list for this problem includes:    Triamcinolone Acetonide 0.1 % Crea (Triamcinolone acetonide) .Marland Kitchen... Apply two times a day as directed  Complete Medication List: 1)  Amlodipine Besylate 2.5 Mg Tabs (Amlodipine besylate) .Marland KitchenMarland KitchenMarland Kitchen  One by mouth once daily 2)  Ecotrin Low Strength 81 Mg Tbec (Aspirin) .... Take 1 tablet by mouth once a day 3)  Accu-chek Aviva Strp (Glucose blood) .... Test once daily 4)  Accu-chek Multiclix Lancets Misc (Lancets) .... Use as directed 5)  Janumet 50-500 Mg Tabs (Sitagliptin-metformin hcl) .... Take one by mouth bid 6)  Ibuprofen 200 Mg Tabs (Ibuprofen) .... As needed for back pain. 7)  Vitamin D 1000 Unit Caps (Cholecalciferol) .... One by mouth once daily 8)  Simvastatin 10 Mg Tabs (Simvastatin) .... One by mouth qpm 9)  Losartan Potassium 25 Mg Tabs (Losartan potassium) .... One by mouth once daily 10)  Triamcinolone Acetonide 0.1 % Crea (Triamcinolone acetonide) .... Apply two times a day as directed  Patient Instructions: 1)  Please schedule a  follow-up appointment in 4 months. 2)  BMP prior to visit, ICD-9:  401.9 3)  HbgA1C prior to visit, ICD-9:  250.00 4)  Urine Microalbumin prior to visit, ICD-9:  250.00 5)  Please return for lab work one (1) week before your next appointment.  Prescriptions: LOSARTAN POTASSIUM 25 MG TABS (LOSARTAN POTASSIUM) one by mouth once daily  #90 x 1   Entered and Authorized by:   [name] DO   Signed by:   [name] DO on 08/21/2010   Method used:   Electronically to        [facility] # [id number]* (retail)       [address] [place], [place]  [address]       Ph: [phone]       Fax: [phone]   RxID:   [id number] SIMVASTATIN 10 MG TABS (SIMVASTATIN) one by mouth qpm  #90 x 1   Entered and Authorized by:   [name] DO   Signed by:   [name] DO on 08/21/2010   Method used:   Electronically to        [facility] # [id number]* (retail)       [address] [place], [place]  [address]       Ph: [phone]       Fax: [phone]   RxID:   [id number] JANUMET 50-500 MG TABS (SITAGLIPTIN-METFORMIN HCL) Take one by mouth bid  #180 Each x 1   Entered and Authorized by:   [name] DO   Signed by:   [name] DO on 08/21/2010   Method used:   Electronically to        [facility] # [phone]* (retail)       [address] [place], [place]  [address]       Ph: [phone]       Fax: [phone]   RxID:   [id number] AMLODIPINE BESYLATE 2.5 MG TABS (AMLODIPINE BESYLATE) one by mouth once daily  #90 x 1   Entered and Authorized by:   [name] DO   Signed by:   [name] DO on 08/21/2010   Method used:   Electronically to        [facility] # [id number]* (retail)       [address]  [address]       [place], [place]  [address]       Ph: [phone]       Fax: [phone]   RxID:   [id number] TRIAMCINOLONE ACETONIDE 0.1 % CREA  (TRIAMCINOLONE ACETONIDE) apply two times a day as directed  #30 grams x 1   Entered and Authorized by:   [name] DO   Signed by:   [name] DO on 08/21/2010   Method used:   Electronically to        [facility] # [id number]* (retail)       [address] [place], [place]  [address]       Ph: [phone]       Fax: [phone]   RxID:   [id number]    Orders Added: 1)  Est. Patient Level III [[address]]       Current Allergies (reviewed today): No known allergies

## 2010-11-17 NOTE — Progress Notes (Signed)
Summary: Lab Work, Scientist, clinical (histocompatibility and immunogenetics)  ---- Converted from flag ---- ---- 08/21/2010 8:51 AM, [name] DO wrote: call pt - labs need to be completed in Dec vs waiting until next OV in 4 months ------------------------------  Phone Note Outgoing Call   Call placed by: Glendell Docker CMA,  August 24, 2010 10:33 AM Call placed to: Patient Summary of Call: attempted to contact patient at [phone], fax tone reached, unable to leave voice message. Call placed to patients cell phone [phone], voice recording reached stating party you have reached is not accepting messages at this time. Unable to leave voice message. Initial call taken by: Glendell Docker CMA,  August 24, 2010 10:35 AM  Follow-up for Phone Call        attempted to contact patient at [phone], line busy x 3 Glendell Docker [facility]  August 24, 2010 3:26 PM    call placed to patient at [phone] fax tone reached, call placed to [phone], recording reached stating person you are trying to call is not accepting calls at this time, unable to leave voice message Follow-up by: Glendell Docker CMA,  August 25, 2010 8:36 AM    Additional Follow-up for Phone Call Additional follow up Details #2::    attempted to contact patient at [phone], faxe tone reached,call placed to [phone], voice recording stating party is not accepting  calls at this time, unable to leave voice message Glendell Docker CMA  August 26, 2010 11:18 AM   Additional Follow-up for Phone Call Additional follow up Details #3:: Details for Additional Follow-up Action Taken: Unable to reach pt by phone, mailed contact letter. Nicki Guadalajara Fergerson CMA Duncan Dull)  August 27, 2010 11:53 AM

## 2010-11-17 NOTE — Medication Information (Signed)
Summary: [facility]  [facility]   Imported By: Lanelle Bal 12/16/2009 11:16:30  _____________________________________________________________________  External Attachment:    Type:   Image     Comment:   External Document

## 2010-11-17 NOTE — Assessment & Plan Note (Signed)
Summary: coughing/dt   Vital Signs:  Patient profile:   65 year old female Height:      61 inches Weight:      181.75 pounds BMI:     34.47 O2 Sat:      97 % on Room air Temp:     98.3 degrees F oral Pulse rate:   80 / minute Pulse rhythm:   regular Resp:     16 per minute BP sitting:   130 / 80  (right arm) Cuff size:   large  Vitals Entered By: Mervin Kung CMA (Feb 25, 2010 3:58 PM)  O2 Flow:  Room air CC: ROOM 5   Productive cough since yesterday. Feels very fatigued due to lack of sleep. Is Patient Diabetic? Yes   Primary Care Provider:  Dondra Spry DO  CC:  ROOM 5   Productive cough since yesterday. Feels very fatigued due to lack of sleep.Marland Kitchen  History of Present Illness: Heather Roberts is a 65 year old female who presents today with c/o cough since Monday, now developing chest soreness due to cough.  She also notes +soreness on the left side.  Denies SOB, + thirst (last CBG was 132) Denies fever.    Allergies (verified): No Known Drug Allergies  Physical Exam  General:  Well-developed,well-nourished,in no acute distress; alert,appropriate and cooperative throughout examination Head:  Normocephalic and atraumatic without obvious abnormalities. No apparent alopecia or balding. Lungs:  Normal respiratory effort, chest expands symmetrically. Lungs are clear to auscultation, no crackles or wheezes. Heart:  Normal rate and regular rhythm. S1 and S2 normal without gallop, murmur, click, rub or other extra sounds.   Impression & Recommendations:  Problem # 1:  BRONCHITIS (ICD-490) Assessment New Phoned patient with results of CXR (negative).  Will treat with Zithromax and Tussionex as needed cough at bedtime.  Pt instructed to follow up in 1 week.   Orders: CXR- 2view (CXR)  Her updated medication list for this problem includes:    Zithromax Z-pak 250 Mg Tabs (Azithromycin) .Marland Kitchen... 2 tabs by mouth today, then one tablet by mouth daily x 4 more days    Tussionex  Pennkinetic Er 8-10 Mg/55ml Lqcr (Chlorpheniramine-hydrocodone) ..... One teaspoon every 12 hours as needed for cough  Complete Medication List: 1)  Amlodipine Besylate 5 Mg Tabs (Amlodipine besylate) .Marland Kitchen.. 1 by mouth once daily 2)  Ecotrin Low Strength 81 Mg Tbec (Aspirin) .... Take 1 tablet by mouth once a day 3)  Simvastatin 80 Mg Tabs (Simvastatin) .Marland Kitchen.. 1 by mouth once daily 4)  Oscal 500/200 D-3 500-200 Mg-unit Tabs (Calcium-vitamin d) .... Take 1 tablet by mouth once a day 5)  Accu-chek Aviva Strp (Glucose blood) .... Test once daily 6)  Accu-chek Multiclix Lancets Misc (Lancets) .... Use as directed 7)  Janumet 50-500 Mg Tabs (Sitagliptin-metformin hcl) .... Take one by mouth bid 8)  Zithromax Z-pak 250 Mg Tabs (Azithromycin) .... 2 tabs by mouth today, then one tablet by mouth daily x 4 more days 9)  Tussionex Pennkinetic Er 8-10 Mg/61ml Lqcr (Chlorpheniramine-hydrocodone) .... One teaspoon every 12 hours as needed for cough  Patient Instructions: 1)  Please complete chest x-ray prior to leaving.  I will call you with the results. Prescriptions: Sandria Senter ER 8-10 MG/5ML LQCR (CHLORPHENIRAMINE-HYDROCODONE) one teaspoon every 12 hours as needed for cough  #150 x 0   Entered and Authorized by:   Lemont Fillers FNP   Signed by:   Lemont Fillers FNP on 02/25/2010  Method used:   Telephoned to ...       [facility] [address] # [phone]* (retail)       [address] [place], [place]  [address]       Ph: [phone]       Fax: [phone]   RxID:   [id number] ZITHROMAX Z-PAK 250 MG TABS (AZITHROMYCIN) 2 tabs by mouth today, then one tablet by mouth daily x 4 more days  #1 pack x 0   Entered and Authorized by:   Lemont Fillers FNP   Signed by:   Lemont Fillers FNP on 02/25/2010   Method used:   Electronically to        [facility] # [id number]* (retail)       [address] [place], [place]   [address]       Ph: [phone]       Fax: [phone]   RxID:   [id number]   Current Allergies (reviewed today): No known allergies

## 2010-11-17 NOTE — Consult Note (Signed)
Summary: [facility]  [facility]   Imported By: Lanelle Bal 12/11/2009 10:53:21  _____________________________________________________________________  External Attachment:    Type:   Image     Comment:   External Document

## 2010-11-17 NOTE — Letter (Signed)
   [place] at [facility] [address] [place], [place]  [address]  [place] Phone: [phone]      March 31, 2010   Heather Roberts [address] [place], [place] [address]  RE:  LAB RESULTS  Dear  [name],  The following is an interpretation of your most recent lab tests.  Please take note of any instructions provided or changes to medications that have resulted from your lab work.  ELECTROLYTES:  Good - no changes needed  KIDNEY FUNCTION TESTS:  Good - no changes needed    DIABETIC STUDIES:  Good - no changes needed, Improved - continue management Blood Glucose: 117   HgbA1C: 6.1   Microalbumin/Creatinine Ratio: 5.8          Sincerely Yours,    Dr. Thomos Lemons

## 2010-11-17 NOTE — Progress Notes (Signed)
Summary: add lipids--neck/shoulder pain  Phone Note Call from Patient Call back at [phone]   Caller: Patient Call For: [name] DO Summary of Call: Received voice message from pt requesting lab results from yesterday.  Left message on machine to return my call.  Mervin Kung CMA  March 31, 2010 4:04 PM   Follow-up for Phone Call        Pt returned my call and was advised of results.  Pt asked about cholesterol results.  Do you want me to add lipid panel to 03/30/10 labs?Mervin Kung CMA  March 31, 2010 4:07 PM   Additional Follow-up for Phone Call Additional follow up Details #1::        Ok to add lipid panel and AST, ALT  if able. - 272.4 also advise pt -  simvastatin dose too high.  recently bulletin came out warning against using 80 mg due to risk of muscle damage.   I suggest we reduce dose to 20 mg.  see rx Additional Follow-up by: [name] DO,  March 31, 2010 5:31 PM    Additional Follow-up for Phone Call Additional follow up Details #2::    Spoke to [facility] and added adiditional labs. Advised pt of need to decrease Simvastatin to 20mg  and new rx has been sent to pharmacy.  Pt wants to know if this could be why her neck and shoulders have been hurting?  Mervin Kung CMA  March 31, 2010 5:49 PM   I doubt neck and shoulder pain from simvastatin.  left message for pt [name] DO  March 31, 2010 6:00 PM   New/Updated Medications: SIMVASTATIN 20 MG TABS (SIMVASTATIN) one by mouth once daily Prescriptions: SIMVASTATIN 20 MG TABS (SIMVASTATIN) one by mouth once daily  #30 x 5   Entered and Authorized by:   [name] DO   Signed by:   [name] DO on 03/31/2010   Method used:   Electronically to        [facility] # [id number]* (retail)       [address] [place], [place]  [address]       Ph: [phone]       Fax: [phone]   RxID:   [id number]

## 2010-11-17 NOTE — Letter (Signed)
Summary: Eye Exam/[facility]  [facility]   Imported By: Maryln Gottron 09/15/2010 10:55:36  _____________________________________________________________________  External Attachment:    Type:   Image     Comment:   External Document

## 2010-11-17 NOTE — Miscellaneous (Signed)
Summary: Eye Exam  Clinical Lists Changes  Observations: Added new observation of DMEYEEXAMNXT: 11/2010 (11/12/2009 15:48) Added new observation of DMEYEEXMRES: normal (11/12/2009 15:48) Added new observation of EYE EXAM BY: [facility] (11/12/2009 15:48) Added new observation of DIAB EYE EX: normal (11/12/2009 15:48)       Diabetes Management Exam:    Eye Exam:       Eye Exam done elsewhere          Date: 11/12/2009          Results: normal          Done by: [facility]

## 2010-11-19 NOTE — Assessment & Plan Note (Signed)
Summary: LEG PAIN/MHF   Vital Signs:  Patient profile:   65 year old female Height:      61 inches Weight:      178.50 pounds BMI:     33.85 O2 Sat:      96 % on Room air Temp:     98.7 degrees F oral Pulse rate:   76 / minute Resp:     18 per minute BP sitting:   130 / 80  (right arm) Cuff size:   large  Vitals Entered By: Glendell Docker CMA (October 01, 2010 3:53 PM)  O2 Flow:  Room air CC: Left leg pain Is Patient Diabetic? Yes Pain Assessment Patient in pain? yes     Location: Leg  Intensity: 8 Type: sharp-Throbbing Onset of pain  With activity   Primary Care Provider:  Dondra Spry DO  CC:  Left leg pain.  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   Lungs:  normal respiratory effort and normal breath sounds.   Heart:  normal rate, regular rhythm, and no gallop.   Msk:  left popliteal fossa is tender   Impression & Recommendations:  Problem # 1:  HAMSTRING TENDINITIS (ICD-726.90) I advised rest and gentle stretching discussed use of ACE and ice  Complete Medication List: 1)  Amlodipine Besylate 2.5 Mg Tabs (Amlodipine besylate) .... One by mouth once daily 2)  Ecotrin Low Strength 81 Mg Tbec (Aspirin) .... Take 1 tablet by mouth once a day 3)  Accu-chek Aviva Strp (Glucose blood) .... Test once  daily 4)  Accu-chek Multiclix Lancets Misc (Lancets) .... Use as directed 5)  Janumet 50-500 Mg Tabs (Sitagliptin-metformin hcl) .... Take one by mouth bid 6)  Vitamin D 1000 Unit Caps (Cholecalciferol) .... One by mouth once daily 7)  Simvastatin 10 Mg Tabs (Simvastatin) .... One by mouth qpm 8)  Losartan Potassium 25 Mg Tabs (Losartan potassium) .... One by mouth once daily   Patient Instructions: 1)  Call our office if your symptoms do not  improve or gets worse.   Orders Added: 1)  Est. Patient Level III [95621]    Current Allergies (reviewed today): No known allergies

## 2010-11-19 NOTE — Letter (Signed)
   [place] [facility] [address] [place], [place]  [address]  [place] Phone: [phone]      October 09, 2010   MARLINA CATALDI [address] [place], [place] [address]  RE:  LAB RESULTS  Dear  [name],  The following is an interpretation of your most recent lab tests.  Please take note of any instructions provided or changes to medications that have resulted from your lab work.  ELECTROLYTES:  Good - no changes needed  KIDNEY FUNCTION TESTS:  Good - no changes needed    DIABETIC STUDIES:  Good - no changes needed Blood Glucose: 98   HgbA1C: 6.0   Microalbumin/Creatinine Ratio: 26.5          Sincerely Yours,    Dr. Thomos Lemons  Appended Document:  mailed

## 2010-11-19 NOTE — Progress Notes (Signed)
Summary: Status Update  Phone Note Call from Patient Call back at Home Phone [phone]   Caller: Patient Call For: [name] DO Complaint: Breathing Problems Summary of Call: patient called and stated that that Sunday afternoon she had heart palpitations, that did not last long. She states she took an aspirin and rested and it went away. She states she did not want to be seen in the ER, and her leg pain is still bothering her. She stated that she will be in Monday or Wednesday of next week for her blood draw Initial call taken by: Glendell Docker CMA,  October 06, 2010 4:43 PM  Follow-up for Phone Call        If pt having palpitations and she wants evaluation, offer her OV on Thurs AM Follow-up by: [name] DO,  October 06, 2010 5:10 PM  Additional Follow-up for Phone Call Additional follow up Details #1::        Notified pt per Dr Olegario Messier instruction. Pt states she has not had any more episodes of palpitations and thinks she was just run down. Pt declined appt. but will call if she has an occurrence. Nicki Guadalajara Fergerson CMA Duncan Dull)  October 07, 2010 10:57 AM

## 2010-11-19 NOTE — Assessment & Plan Note (Signed)
Summary: knot on leg/mhf   Vital Signs:  Patient profile:   65 year old female Height:      61 inches Weight:      178.75 pounds BMI:     33.90 O2 Sat:      98 % on Room air Temp:     97.9 degrees F oral Pulse rate:   68 / minute Resp:     18 per minute BP sitting:   110 / 60  (right arm) Cuff size:   large  Vitals Entered By: Glendell Docker CMA (October 27, 2010 1:03 PM)  O2 Flow:  Room air CC: Evaluation of left leg, Lower Extremity Joint pain Is Patient Diabetic? Yes Pain Assessment Patient in pain? no      Comments hard knot on left lower leg, noticed Saurday afternoon, states leg felt tight and hot, Satruday night she felt weak,  Sunday morning she has vomiting after eating cereal for breakfeast, yesterday she had a throbbing in her leg, radiating to pain 7/10 in  hip, attempted to elevate leg with some relief   Primary Care Provider:  Dondra Spry DO  CC:  Evaluation of left leg and Lower Extremity Joint pain.  History of Present Illness:  Lower Extremity Joint Pain      This is a 65 year old woman who presents with Lower Extremity Joint pain.  The patient denies swelling and redness.  The pain is located in the left knee.  The pain began with no injury.  The pain is described as aching, intermittent, and activity related.  Evaluation to date has included no evaluation. pt describes tight sensation of knee when she squats.  knee feels tight with bending   htn - occ lightheadedness.  worse in AM after meds.  sbp 98 this AM.  feels weak when bp is low  Preventive Screening-Counseling & Management  Alcohol-Tobacco     Smoking Status: current  Allergies (verified): No Known Drug Allergies  Past History:  Past Medical History: DVT, hx of Hyperlipidemia   Hypertension     lumbar djd   knee djd   hand djd Allergic rhinitis Diabetes mellitus, type II - diet Anxiety Glaucoma  Past Surgical History: knee arthoscopy Hysterectomy   Cholecystectomy        Family History: Family History Breast cancer 1st degree relative  - sister at 69 yo Family History of Stroke M 1st degree relative         Social History: Former Smoker Alcohol use-no   Married   3 children daughter shama - homemaker  - 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   Lungs:  normal respiratory effort and normal breath sounds.   Heart:  normal rate, regular rhythm, and no gallop.   Msk:  left knee - medial joint line tenderness.  small 1 cm below medial tibial plateau bakers cyst positive apley's compression test   Impression & Recommendations:  Problem # 1:  KNEE PAIN, LEFT (ICD-719.46) pt with medial joint line pain.  positive apley's compression test.  probable meniscal injury she also has bakers cyst that feels tight with squatting use OTC NSAIDs for now  refer to ortho for further eval and tx  Her updated medication list for this problem includes:    Ecotrin Low Strength 81 Mg Tbec (Aspirin) .Marland Kitchen... Take 1 tablet by mouth once a day  Orders: Orthopedic Referral (Ortho)  Problem # 2:  HYPERTENSION (ICD-401.9) pt have periodic dizziness with low bp.  DC amlodipine  The following medications were removed from the medication list:    Amlodipine Besylate 2.5 Mg Tabs (Amlodipine besylate) ..... One by mouth once daily Her updated medication list for this problem includes:    Losartan Potassium 25 Mg Tabs (Losartan potassium) ..... One by mouth once daily  BP today: 110/60 Prior BP: 130/80 (10/01/2010)  Labs Reviewed: K+: 4.4 (10/08/2010) Creat: : 0.82 (10/08/2010)   Chol: 142 (03/31/2010)   HDL: 60 (03/31/2010)   LDL: 61 (03/31/2010)   TG: 105 (03/31/2010)  Complete Medication List: 1)  Ecotrin Low Strength 81 Mg Tbec (Aspirin) .... Take 1 tablet by mouth once a day 2)  Accu-chek Aviva Strp (Glucose blood) .... Test once daily 3)  Accu-chek Multiclix  Lancets Misc (Lancets) .... Use as directed 4)  Janumet 50-500 Mg Tabs (Sitagliptin-metformin hcl) .... Take one by mouth bid 5)  Vitamin D 1000 Unit Caps (Cholecalciferol) .... One by mouth once daily 6)  Simvastatin 10 Mg Tabs (Simvastatin) .... One by mouth qpm 7)  Losartan Potassium 25 Mg Tabs (Losartan potassium) .... One by mouth once daily  Patient Instructions: 1)  Please schedule a follow-up appointment in 3 months. 2)  Use ibuprofen 400mg  by mouth two times a day as needed x 1 week 3)  Our office will contact you re:  orthopedic referral   Orders Added: 1)  Orthopedic Referral [Ortho] 2)  Est. Patient Level III [81191]    Current Allergies (reviewed today): No known allergies

## 2010-11-19 NOTE — Medication Information (Signed)
Summary: Diabetes Supplies/[facility]  Diabetes Supplies/[facility]   Imported By: Lanelle Bal 11/11/2010 08:51:13  _____________________________________________________________________  External Attachment:    Type:   Image     Comment:   External Document

## 2010-11-20 NOTE — Letter (Signed)
Summary: Patient Cancelled Appt/High [facility]   Patient Cancelled Appt/High [facility]   Imported By: Lanelle Bal 01/06/2010 08:46:52  _____________________________________________________________________  External Attachment:    Type:   Image     Comment:   External Document

## 2010-11-23 ENCOUNTER — Encounter: Payer: Self-pay | Admitting: Internal Medicine

## 2010-11-23 LAB — HM MAMMOGRAPHY: HM Mammogram: NORMAL

## 2010-11-24 ENCOUNTER — Encounter: Payer: Self-pay | Admitting: Internal Medicine

## 2010-12-03 NOTE — Miscellaneous (Signed)
Summary: mammogram  Clinical Lists Changes  Observations: Added new observation of MAMMOGRAM: normal (11/23/2010 15:00)      Preventive Care Screening  Mammogram:    Date:  11/23/2010    Results:  normal

## 2010-12-09 ENCOUNTER — Encounter: Payer: Self-pay | Admitting: Internal Medicine

## 2010-12-09 ENCOUNTER — Ambulatory Visit (INDEPENDENT_AMBULATORY_CARE_PROVIDER_SITE_OTHER): Payer: BC Managed Care – PPO | Admitting: Internal Medicine

## 2010-12-09 DIAGNOSIS — I10 Essential (primary) hypertension: Secondary | ICD-10-CM

## 2010-12-09 DIAGNOSIS — E119 Type 2 diabetes mellitus without complications: Secondary | ICD-10-CM

## 2010-12-22 ENCOUNTER — Encounter: Payer: Self-pay | Admitting: Internal Medicine

## 2010-12-22 ENCOUNTER — Telehealth: Payer: Self-pay | Admitting: Internal Medicine

## 2010-12-23 ENCOUNTER — Telehealth: Payer: Self-pay | Admitting: Internal Medicine

## 2010-12-29 NOTE — Progress Notes (Signed)
Summary: diabetes testing supplies  Phone Note Outgoing Call   Call placed by: Mervin Kung, CMA (AAMA) Call placed to: Patient Summary of Call: Received fax from [facility] re: diabetes testing supplies. Form was completed previously for [facility] for the same supplies. Left message on machine for pt to return my call. Who does pt want to get supplies from?  Nicki Guadalajara Fergerson CMA Duncan Dull)  December 22, 2010 11:50 AM   Follow-up for Phone Call        Left message on machine to return my call. Nicki Guadalajara Fergerson CMA Duncan Dull)  December 23, 2010 10:14 AM   Additional Follow-up for Phone Call Additional follow up Details #1::        Pt returned my call stating she has received supplies  from [employer] and will continue services with them. Faxed response to [facility] that pt will not be using their services. Nicki Guadalajara Fergerson CMA Duncan Dull)  December 23, 2010 4:03 PM

## 2010-12-29 NOTE — Progress Notes (Signed)
Summary: BP reading, knee swelling  Phone Note Call from Patient Call back at Home Phone [phone]   Caller: Patient Call For: [name] DO Summary of Call: Received call from pt stating her BP yesterday was 159/79 at [facility] and is reporting reading per Dr Olegario Messier instruction. Please advise if pt needs to make any changes. Initial call taken by: Mervin Kung CMA Duncan Dull),  December 23, 2010 4:08 PM  Follow-up for Phone Call        take 3-4 additional bp readings at home,  if still elevated I suggest OV Follow-up by: [name] DO,  December 24, 2010 4:48 PM  Additional Follow-up for Phone Call Additional follow up Details #1::        Pt states she will take additional readings and call for appt if still elevated. Pt wanted you to know that she has had some swelling behind her knee and  has appt with Dr Shelle Iron on 12/31/10 for this. Nicki Guadalajara Fergerson CMA Duncan Dull)  December 24, 2010 4:56 PM

## 2010-12-29 NOTE — Assessment & Plan Note (Signed)
Summary: 4 month follow up/mhf   Vital Signs:  Patient profile:   65 year old female Height:      61 inches Weight:      181.25 pounds BMI:     34.37 O2 Sat:      100 % on Room air Temp:     98.1 degrees F oral Pulse rate:   67 / minute Resp:     20 per minute BP sitting:   124 / 70  (right arm) Cuff size:   large  Vitals Entered By: Glendell Docker CMA (December 09, 2010 8:07 AM)  O2 Flow:  Room air CC: 4 Month follow up  Comments evaluation of left knee-feels like fluid build up., low blood sugar 114, high 123  avg 110, discuss simvastatin   Primary Care Provider:  Dondra Spry DO  CC:  4 Month follow up .  History of Present Illness:  65 year old female for followup  interval hx: left knee injected local swelling at injection site pt tried to rest x 2 weeks  * diabetes mellitus type 2- mild weight gain since previous visit  She  attributes to less physical activity due to her knee  issues   home blood sugar readings are stable   *  Hypertension-stable  Preventive Screening-Counseling & Management  Alcohol-Tobacco     Smoking Status: quit  Allergies (verified): No Known Drug Allergies  Past History:  Past Medical History: DVT, hx of Hyperlipidemia   Hypertension     lumbar djd   knee djd    hand djd Allergic rhinitis Diabetes mellitus, type II  Anxiety Glaucoma  Past Surgical History: knee arthoscopy Hysterectomy   Cholecystectomy        Social History: Smoking Status:  quit  Physical Exam  General:  alert, well-developed, and well-nourished.   Lungs:  normal respiratory effort and normal breath sounds.   Heart:  normal rate, regular rhythm, and no gallop.   Extremities:  No lower extremity edema   Impression & Recommendations:  Problem # 1:  DIABETES MELLITUS, TYPE II (ICD-250.00) Assessment Unchanged  Her updated medication list for this problem includes:    Ecotrin Low Strength 81 Mg Tbec (Aspirin) .Marland Kitchen... Take 1 tablet by mouth  once a day    Janumet 50-500 Mg Tabs (Sitagliptin-metformin hcl) .Marland Kitchen... Take one by mouth bid    Losartan Potassium 50 Mg Tabs (Losartan potassium) ..... One by mouth once daily  Labs Reviewed: Creat: 0.82 (10/08/2010)     Last Eye Exam: no diabetic retinopathy. Glaucoma bilaterally (09/04/2010) Reviewed HgBA1c results: 6.0 (10/08/2010)  6.0 (06/19/2010)  Problem # 2:  HYPERTENSION (ICD-401.9) Assessment: Unchanged  Her updated medication list for this problem includes:    Losartan Potassium 50 Mg Tabs (Losartan potassium) ..... One by mouth once daily  BP today: 124/70 Prior BP: 110/60 (10/27/2010)  Labs Reviewed: K+: 4.4 (10/08/2010) Creat: : 0.82 (10/08/2010)   Chol: 142 (03/31/2010)   HDL: 60 (03/31/2010)   LDL: 61 (03/31/2010)   TG: 105 (03/31/2010)  Complete Medication List: 1)  Ecotrin Low Strength 81 Mg Tbec (Aspirin) .... Take 1 tablet by mouth once a day 2)  Accu-chek Aviva Strp (Glucose blood) .... Test once daily 3)  Accu-chek Multiclix Lancets Misc (Lancets) .... Use as directed 4)  Janumet 50-500 Mg Tabs (Sitagliptin-metformin hcl) .... Take one by mouth bid 5)  Vitamin D 1000 Unit Caps (Cholecalciferol) .... One by mouth once daily 6)  Losartan Potassium 50 Mg Tabs (Losartan potassium) .Marland KitchenMarland KitchenMarland Kitchen  One by mouth once daily 7)  Simvastatin 10 Mg Tabs (Simvastatin) .Marland Kitchen.. 1 by mouth at bedtime  Patient Instructions: 1)  Please schedule a follow-up appointment in 3 months. 2)  BMP prior to visit, ICD-9:  401.9 3)  HbgA1C prior to visit, ICD-9: 250.00 4)  Please return for lab work one (1) week before your next appointment.  Prescriptions: LOSARTAN POTASSIUM 50 MG TABS (LOSARTAN POTASSIUM) one by mouth once daily  #90 x 1   Entered and Authorized by:   [name] DO   Signed by:   [name] DO on 12/09/2010   Method used:   Print then Give to Patient   RxID:   [id number]    Orders Added: 1)  Est. Patient Level III [65784]    Current Allergies (reviewed  today): No known allergies    Preventive Care Screening  Pap Smear:    Date:  07/07/2010    Results:  normal

## 2011-01-05 NOTE — Letter (Signed)
Summary: Blood Pressure Check/[facility]  Blood Pressure Check/[facility]   Imported By: Maryln Gottron 12/31/2010 14:58:00  _____________________________________________________________________  External Attachment:    Type:   Image     Comment:   External Document

## 2011-01-07 ENCOUNTER — Telehealth: Payer: Self-pay | Admitting: *Deleted

## 2011-01-07 NOTE — Telephone Encounter (Signed)
Pt called to report that she had her MRI this a.m. Her BP reading was 120/78. She wanted to let you know that she will see Dr Shelle Iron on 01/14/11 and will get the date of her surgery at that time.  Pt asks for you to call her after 3pm if you need to speak to her because she will be out of town this morning.

## 2011-01-19 ENCOUNTER — Encounter: Payer: Self-pay | Admitting: Internal Medicine

## 2011-01-19 ENCOUNTER — Ambulatory Visit (INDEPENDENT_AMBULATORY_CARE_PROVIDER_SITE_OTHER): Payer: BC Managed Care – PPO | Admitting: Internal Medicine

## 2011-01-19 ENCOUNTER — Ambulatory Visit (HOSPITAL_BASED_OUTPATIENT_CLINIC_OR_DEPARTMENT_OTHER)
Admission: RE | Admit: 2011-01-19 | Discharge: 2011-01-19 | Disposition: A | Discharge status: Home / Self Care | Payer: BC Managed Care – PPO | Source: Ambulatory Visit | Attending: Internal Medicine | Admitting: Internal Medicine

## 2011-01-19 VITALS — BP 132/82 | HR 56 | Temp 98.1°F | Resp 20 | Ht 61.0 in | Wt 178.0 lb

## 2011-01-19 DIAGNOSIS — M25569 Pain in unspecified knee: Secondary | ICD-10-CM

## 2011-01-19 DIAGNOSIS — Z01818 Encounter for other preprocedural examination: Secondary | ICD-10-CM | POA: Insufficient documentation

## 2011-01-19 DIAGNOSIS — E119 Type 2 diabetes mellitus without complications: Secondary | ICD-10-CM

## 2011-01-19 DIAGNOSIS — M779 Enthesopathy, unspecified: Secondary | ICD-10-CM

## 2011-01-19 DIAGNOSIS — I10 Essential (primary) hypertension: Secondary | ICD-10-CM

## 2011-01-19 DIAGNOSIS — M23305 Other meniscus derangements, unspecified medial meniscus, unspecified knee: Secondary | ICD-10-CM | POA: Insufficient documentation

## 2011-01-19 MED ORDER — MELOXICAM 7.5 MG PO TABS: 7.5000 mg | ORAL_TABLET | Freq: Every day | ORAL | Status: DC | PRN

## 2011-01-19 NOTE — Progress Notes (Signed)
  Subjective:    Patient ID: Heather Roberts, female    DOB: November 29, 1945, 65 y.o.   MRN: [id number]  HPI 65 y/o female for follow up Pt seen by ortho re:  Left knee pain.  She is in process of scheduling knee arthroscopy.  Pt  slipped off of back steps yesterday.  Pt not sure if not if she twisted her knee.  Left knee pain is much worse.  Pain with movement.  Left popliteal fossa is tender.  Pt also notes when she stands for more than 10-15 mins, she feels like something is crawling up her leg.  She discussed symptoms with ortho.  PA raised concern for possible DVT.  Htn - stable.  Denies chest pain or shortness of breath  DM II - stable  Review of Systems  No chest pain or shortness of breath.   Past Medical History  Diagnosis Date  . History of DVT (deep vein thrombosis)   . Hyperlipidemia   . Hypertension   . DJD (degenerative joint disease), lumbar   . Bouchard nodes (DJD hand)   . Allergic rhinitis   . Diabetes mellitus type II   . Anxiety   . Glaucoma     History   Social History  . Marital Status: Married    Spouse Name: N/A    Number of Children: N/A  . Years of Education: N/A   Occupational History  . Not on file.   Social History Main Topics  . Smoking status: Former Games developer  . Smokeless tobacco: Not on file  . Alcohol Use: Not on file  . Drug Use: Not on file  . Sexually Active: Not on file   Other Topics Concern  . 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 Knee arthroscopy   . Cholecystectomy   . Abdominal hysterectomy     Family History  Problem Relation Age of Onset  . Breast cancer Sister     age 54  . Stroke      No Known Allergies  No current outpatient prescriptions on file prior to visit.    BP 132/82  Pulse 56  Temp(Src) 98.1 F (36.7 C) (Oral)  Resp 20  Ht 5\' 1"   (1.549 m)  Wt 178 lb (80.74 kg)  BMI 33.63 kg/m2  SpO2 100%       Objective:   Physical Exam  Constitutional: She appears well-developed and well-nourished. No distress.  HENT:  Head: Normocephalic and atraumatic.  Cardiovascular: Normal rate, regular rhythm and normal heart sounds.   Pulmonary/Chest: Effort normal and breath sounds normal. No respiratory distress.  Musculoskeletal:       Legs:      Left leg tenderness,  Left popliteal fossa Discomfort with flex and ext Intact PD and PT pulses.   Skin: Skin is warm and dry.  Psychiatric: Her behavior is normal.          Assessment & Plan:

## 2011-01-19 NOTE — Patient Instructions (Addendum)
Use cool compress to left knee 3 x daily as directed Elevated left lower extremity Use meloxicam 7.5 mg once daily as needed. Stop aspirin while you are taking meloxicam Stop aspirin 1 week before your knee surgery

## 2011-01-19 NOTE — Assessment & Plan Note (Signed)
Left knee pain worse after recent trauma.   Use meloxicam as directed.   She has significant pain in popliteal fossa.  Question ruptured bakers cyst.   Pt has hx of dvt.  Obtain venous doppler

## 2011-01-19 NOTE — Assessment & Plan Note (Signed)
No hx of CAD.   No chest pain or SOB Previous EKG in 08/2010 normal BP and DM II well controlled. Proceed with Left knee arthroscopy Pt advised to stop ASA 1 week before surgery No further pre op testing recommended

## 2011-02-04 ENCOUNTER — Encounter: Payer: BC Managed Care – PPO | Admitting: Internal Medicine

## 2011-02-05 ENCOUNTER — Ambulatory Visit (HOSPITAL_BASED_OUTPATIENT_CLINIC_OR_DEPARTMENT_OTHER)
Admission: RE | Admit: 2011-02-05 | Discharge: 2011-02-05 | Disposition: A | Discharge status: Home / Self Care | Payer: BC Managed Care – PPO | Source: Ambulatory Visit | Attending: Specialist | Admitting: Specialist

## 2011-02-05 DIAGNOSIS — M2240 Chondromalacia patellae, unspecified knee: Secondary | ICD-10-CM | POA: Insufficient documentation

## 2011-02-05 DIAGNOSIS — S83289A Other tear of lateral meniscus, current injury, unspecified knee, initial encounter: Secondary | ICD-10-CM | POA: Insufficient documentation

## 2011-02-05 DIAGNOSIS — IMO0002 Reserved for concepts with insufficient information to code with codable children: Secondary | ICD-10-CM | POA: Insufficient documentation

## 2011-02-05 DIAGNOSIS — X58XXXA Exposure to other specified factors, initial encounter: Secondary | ICD-10-CM | POA: Insufficient documentation

## 2011-02-05 DIAGNOSIS — M2340 Loose body in knee, unspecified knee: Secondary | ICD-10-CM | POA: Insufficient documentation

## 2011-02-05 DIAGNOSIS — Z01812 Encounter for preprocedural laboratory examination: Secondary | ICD-10-CM | POA: Insufficient documentation

## 2011-02-05 LAB — POCT I-STAT 4, (NA,K, GLUC, HGB,HCT): Sodium: 142 mEq/L (ref 135–145)

## 2011-02-22 NOTE — Op Note (Signed)
  NAME:  Heather Roberts, Heather Roberts             ACCOUNT NO.:  [id number]  MEDICAL RECORD NO.:  [id number]           PATIENT TYPE:  O  LOCATION:  XRAY                          FACILITY:  [facility]  PHYSICIAN:  Jene Every, M.D.    DATE OF BIRTH:  [birth year]  DATE OF PROCEDURE:  02/05/2011 DATE OF DISCHARGE:  01/19/2011                              OPERATIVE REPORT   PREOPERATIVE DIAGNOSIS:  Medial meniscus tear of the left knee.  POSTOPERATIVE DIAGNOSIS:  Medial meniscus tear of the left knee, lateral meniscus tear, loose bodies, grade 3 chondromalacia of patellofemoral joint.  PROCEDURE PERFORMED: 1. Left knee arthroscopy. 2. Partial medial and lateral meniscectomy. 3. Evacuation of loose bodies. 4. Chondroplasty of patellofemoral joint.  BRIEF HISTORY:  This 65 year old female with locking, popping, giving way.  MRI indicated meniscus tear, chondromalacia of patellofemoral joint indicated for arthroscopic debridement and partial meniscectomy. Risks and benefits discussed including bleeding, infection, no change in symptoms, worsening in symptoms, need for repeat debridement, DVT, PE, anesthetic complications, etc.  TECHNIQUE:  The patient in supine position.  After induction of adequate anesthesia, 1 g of Kefzol, left lower extremity was prepped and draped in the usual sterile fashion.  A lateral parapatellar portal and superomedial parapatellar portal was fashioned with a #11 blade. Ingress cannula atraumatically placed.  Irrigant was utilized to insufflate the joint.  Under direct visualization, medial parapatellar portal was fashioned with a #11 blade after localization with an 18- gauge needle sparing the medial meniscus.  There was extensive tearing of posterior third of the medial meniscus.  Straight basket rongeur introduced utilized to perform a partial medial meniscectomy to a stable base, further contoured with 3.5 Cuda shaver.  Remnant of meniscus stable to probe palpation,  mild grade 3 changes of the tibia.  Light chondroplasty performed here.  Loose cartilaginous bodies were also removed from the medial compartment as well.  ACL was unremarkable.  Lateral compartment revealed degenerative tearing of the inner rim of the lateral meniscus.  This was debrided to a stable base.  Loose cartilaginous debris was noted here as well and this was evacuated with a shaver.  Suprapatellar pouch, grade 3 changes of patellofemoral joint.  Light chondroplasty performed here, normal patellofemoral tracking.  Gutters were unremarkable.  Reexamined all compartments.  No further pathology amenable to arthroscopic intervention; therefore, removed all our instrumentation.  Portals were closed 4-0 nylon simple sutures.  A 0.25% Marcaine with epinephrine was infiltrated in the joint.  Wound was dressed sterilely.  She was awoken without difficulty and transported to recovery room in satisfactory condition.  The patient tolerated the procedure well.  No complications.  Assistant was none.     Jene Every, M.D.     Cordelia Pen  D:  02/05/2011  T:  02/05/2011  Job:  [id number]  Electronically Signed by Jene Every M.D. on 02/22/2011 02:22:40 PM

## 2011-03-05 ENCOUNTER — Ambulatory Visit: Payer: BC Managed Care – PPO | Admitting: Internal Medicine

## 2011-03-16 ENCOUNTER — Telehealth: Payer: Self-pay | Admitting: *Deleted

## 2011-03-16 NOTE — Telephone Encounter (Signed)
Please call pt and arrange a follow up visit.

## 2011-03-16 NOTE — Telephone Encounter (Signed)
Attempted to reach pt and left detailed message on home phone re: Melissa's instructions and for pt to bring her BP monitor with her to her follow up appt.

## 2011-03-16 NOTE — Telephone Encounter (Signed)
Pt called stating her BP on Saturday was 129/68. Sunday had a reading of 202/92. Monday she was in an accident and had 2 elevated readings of 203/93 and 200/118. Today her BP was 125/60. Pt wanted [place] to be aware and states it is ok to leave a message on her voicemail.  Please advise.

## 2011-03-18 ENCOUNTER — Encounter: Payer: Self-pay | Admitting: Internal Medicine

## 2011-03-18 ENCOUNTER — Encounter: Payer: Self-pay | Admitting: Family Medicine

## 2011-03-18 ENCOUNTER — Ambulatory Visit (INDEPENDENT_AMBULATORY_CARE_PROVIDER_SITE_OTHER): Payer: BC Managed Care – PPO | Admitting: Family Medicine

## 2011-03-18 DIAGNOSIS — I10 Essential (primary) hypertension: Secondary | ICD-10-CM

## 2011-03-18 NOTE — Assessment & Plan Note (Signed)
Normal the last 24h or so. She had acute rise secondary to severe anxiety and this response lasted a couple of days.   I can see no other explanation for her acute bp rise. Reassured pt today.  Encouraged her to buy an upper arm automated bp cuff for home monitoring to replace her wrist cuff since wrist cuffs a bit less accurate.
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
## 2011-05-04 ENCOUNTER — Ambulatory Visit (INDEPENDENT_AMBULATORY_CARE_PROVIDER_SITE_OTHER): Payer: BC Managed Care – PPO | Admitting: Internal Medicine

## 2011-05-04 ENCOUNTER — Encounter: Payer: Self-pay | Admitting: Internal Medicine

## 2011-05-04 DIAGNOSIS — E119 Type 2 diabetes mellitus without complications: Secondary | ICD-10-CM

## 2011-05-04 DIAGNOSIS — E785 Hyperlipidemia, unspecified: Secondary | ICD-10-CM

## 2011-05-04 LAB — CBC WITH DIFFERENTIAL/PLATELET
Basophils Relative: 0 % (ref 0–1)
Eosinophils Absolute: 0.1 10*3/uL (ref 0.0–0.7)
Eosinophils Relative: 2 % (ref 0–5)
HCT: 37.2 % (ref 36.0–46.0)
Hemoglobin: 12.6 g/dL (ref 12.0–15.0)
MCH: 30.7 pg (ref 26.0–34.0)
MCHC: 33.9 g/dL (ref 30.0–36.0)
MCV: 90.7 fL (ref 78.0–100.0)
Monocytes Absolute: 0.6 10*3/uL (ref 0.1–1.0)
Monocytes Relative: 9 % (ref 3–12)
RDW: 14 % (ref 11.5–15.5)

## 2011-05-04 NOTE — Patient Instructions (Signed)
Please schedule chem7, a1c 250.0 prior to next visit 

## 2011-05-04 NOTE — Progress Notes (Signed)
  Subjective:    Patient ID: Heather Roberts, female    DOB: Jun 23, 1946, 65 y.o.   MRN: [id number]  HPI Pt presents to clinic for followup of multiple medical problems. Blood sugar under good control with range of 114-127 without hypoglycemia. Notes in future janumet may be too expensive. Tolerates statin tx without myalgias or abn lfts. Notes recent left earache x 2-3 days now nearly resolved. No fever, chills or drainage. No exacerbating or alleviating factors. No other complaints.  Reviewed pmh, medications and allergies.   Review of Systems see hpi    Objective:   Physical Exam  Nursing note and vitals reviewed. Constitutional: She appears well-developed and well-nourished. No distress.  HENT:  Head: Normocephalic and atraumatic.  Right Ear: Tympanic membrane, external ear and ear canal normal.  Left Ear: Tympanic membrane, external ear and ear canal normal.  Nose: Nose normal.  Mouth/Throat: Oropharynx is clear and moist. No oropharyngeal exudate.  Eyes: Conjunctivae are normal. No scleral icterus.  Neck: Neck supple. No JVD present. Carotid bruit is not present.  Cardiovascular: Normal rate, regular rhythm and normal heart sounds.  Exam reveals no gallop and no friction rub.   No murmur heard. Pulmonary/Chest: Effort normal and breath sounds normal. No respiratory distress. She has no wheezes. She has no rales.  Neurological: She is alert.  Skin: Skin is warm and dry. She is not diaphoretic.  Psychiatric: She has a normal mood and affect.          Assessment & Plan:

## 2011-05-05 ENCOUNTER — Telehealth: Payer: Self-pay | Admitting: Internal Medicine

## 2011-05-05 LAB — LIPID PANEL
HDL: 65 mg/dL (ref >39)
LDL Cholesterol: 88 mg/dL (ref 0–99)

## 2011-05-05 LAB — BASIC METABOLIC PANEL
BUN: 12 mg/dL (ref 6–23)
Creat: 0.81 mg/dL (ref 0.50–1.10)
Glucose, Bld: 111 mg/dL — ABNORMAL HIGH (ref 70–99)
Potassium: 3.9 mEq/L (ref 3.5–5.3)

## 2011-05-05 LAB — HEPATIC FUNCTION PANEL
Albumin: 4.3 g/dL (ref 3.5–5.2)
Alkaline Phosphatase: 92 U/L (ref 39–117)
Bilirubin, Direct: 0.1 mg/dL (ref 0.0–0.3)
Total Bilirubin: 0.7 mg/dL (ref 0.3–1.2)

## 2011-05-05 LAB — HEMOGLOBIN A1C: Hgb A1c MFr Bld: 6.1 % — ABNORMAL HIGH (ref <5.7)

## 2011-05-05 NOTE — Telephone Encounter (Signed)
Patient would like lab results.

## 2011-05-06 ENCOUNTER — Telehealth: Payer: Self-pay | Admitting: Internal Medicine

## 2011-05-06 NOTE — Telephone Encounter (Signed)
Please call patient with results.

## 2011-05-06 NOTE — Telephone Encounter (Signed)
See results notes. 

## 2011-05-06 NOTE — Telephone Encounter (Signed)
Pt.notified

## 2011-05-09 NOTE — Assessment & Plan Note (Signed)
Obtain CBC, Chem-7 and A1c. Good control. Continue current regimen however consider change from janument to metformin in the future for cost consideration.

## 2011-05-09 NOTE — Assessment & Plan Note (Signed)
Obtain fasting lipid profile and liver function tests. LDL goal less than 100.

## 2011-05-28 ENCOUNTER — Telehealth: Payer: Self-pay | Admitting: Internal Medicine

## 2011-05-28 MED ORDER — SIMVASTATIN 10 MG PO TABS: 10.0000 mg | ORAL_TABLET | Freq: Every day | ORAL | Status: DC

## 2011-05-28 NOTE — Telephone Encounter (Signed)
Patient called stating that she only has three pills left of simvastatin. [facility] [address] [place]

## 2011-05-28 NOTE — Telephone Encounter (Signed)
Rx refill sent to pharmacy. 

## 2011-07-08 ENCOUNTER — Encounter: Payer: Self-pay | Admitting: Internal Medicine

## 2011-07-08 ENCOUNTER — Ambulatory Visit (INDEPENDENT_AMBULATORY_CARE_PROVIDER_SITE_OTHER): Payer: Medicare Other | Admitting: Internal Medicine

## 2011-07-08 DIAGNOSIS — M76899 Other specified enthesopathies of unspecified lower limb, excluding foot: Secondary | ICD-10-CM

## 2011-07-08 DIAGNOSIS — J069 Acute upper respiratory infection, unspecified: Secondary | ICD-10-CM

## 2011-07-08 DIAGNOSIS — E119 Type 2 diabetes mellitus without complications: Secondary | ICD-10-CM

## 2011-07-08 DIAGNOSIS — M7060 Trochanteric bursitis, unspecified hip: Secondary | ICD-10-CM

## 2011-07-11 DIAGNOSIS — M7060 Trochanteric bursitis, unspecified hip: Secondary | ICD-10-CM | POA: Insufficient documentation

## 2011-07-11 DIAGNOSIS — J069 Acute upper respiratory infection, unspecified: Secondary | ICD-10-CM | POA: Insufficient documentation

## 2011-07-11 MED ORDER — LOSARTAN POTASSIUM 50 MG PO TABS: 50.0000 mg | ORAL_TABLET | Freq: Every day | ORAL | Status: DC

## 2011-07-11 NOTE — Assessment & Plan Note (Signed)
Suspect possible viral etiology. Recommend otc sx tx prn. Followup if no improvement or worsening.

## 2011-07-11 NOTE — Progress Notes (Signed)
  Subjective:    Patient ID: Heather Roberts, female    DOB: [birth year], 65 y.o.   MRN: [id number]  HPI Pt presents to clinic for evaluation of ST. Notes 2day h/o right ear ringing, ST, dizziness and loose stools. Denies fever, chills, cough, ear discharge or blood in stool. S/p knee surgery and now has left lateral upper leg pain with associated tenderness. No injury or trauma. Diabetes mellitus remains under good control with last a1c 6.1. FSBS peak result of 133 after dietary indiscretion for birthday. No other complaints.  Past Medical History  Diagnosis Date  . History of DVT (deep vein thrombosis)   . Hyperlipidemia   . Hypertension   . DJD (degenerative joint disease), lumbar   . Bouchard nodes (DJD hand)   . Allergic rhinitis   . Diabetes mellitus type II   . Anxiety   . Glaucoma    Past Surgical History  Procedure Date  . Knee arthroscopy   . Cholecystectomy   . Abdominal hysterectomy     reports that she has quit smoking. She has never used smokeless tobacco. She reports that she does not drink alcohol. Her drug history not on file. family history includes Breast cancer in her sister and Stroke in an unspecified family member. No Known Allergies     Review of Systems see hpi     Objective:   Physical Exam  Nursing note and vitals reviewed. Constitutional: She appears well-developed and well-nourished. No distress.  HENT:  Head: Normocephalic and atraumatic.  Right Ear: Tympanic membrane, external ear and ear canal normal.  Left Ear: Tympanic membrane, external ear and ear canal normal.  Nose: Nose normal.  Mouth/Throat: Uvula is midline, oropharynx is clear and moist and mucous membranes are normal. No oropharyngeal exudate.  Eyes: Conjunctivae are normal.  Abdominal: There is no tenderness.  Musculoskeletal:       Mild tenderness left greater trochanter area. Gait nl  Neurological: She is alert.  Skin: Skin is warm and dry. She is not diaphoretic.    Psychiatric: She has a normal mood and affect.          Assessment & Plan:

## 2011-07-11 NOTE — Assessment & Plan Note (Signed)
Good control. Decrease janument to qd. Monitor fsbs

## 2011-07-11 NOTE — Assessment & Plan Note (Signed)
Attempt otc nsaids with food and no other nsaids. Followup if no improvement or worsening.

## 2011-07-15 ENCOUNTER — Telehealth: Payer: Self-pay | Admitting: Internal Medicine

## 2011-07-15 NOTE — Telephone Encounter (Signed)
PLEASE CALL THE PATIENT.

## 2011-07-16 ENCOUNTER — Encounter: Payer: Self-pay | Admitting: Internal Medicine

## 2011-07-16 ENCOUNTER — Ambulatory Visit (INDEPENDENT_AMBULATORY_CARE_PROVIDER_SITE_OTHER): Payer: Medicare Other | Admitting: Internal Medicine

## 2011-07-16 VITALS — BP 110/80 | HR 63 | Temp 98.1°F | Resp 18 | Wt 179.0 lb

## 2011-07-16 DIAGNOSIS — M545 Low back pain: Secondary | ICD-10-CM | POA: Insufficient documentation

## 2011-07-16 DIAGNOSIS — M549 Dorsalgia, unspecified: Secondary | ICD-10-CM

## 2011-07-16 MED ORDER — CYCLOBENZAPRINE HCL 10 MG PO TABS: 10.0000 mg | ORAL_TABLET | Freq: Three times a day (TID) | ORAL | Status: DC | PRN

## 2011-07-16 NOTE — Progress Notes (Signed)
  Subjective:    Patient ID: Heather Roberts, female    DOB: [birth year], 65 y.o.   MRN: [id number]  HPI Pt presents to clinic as a work in for evaluation of back pain. Notes 4 day h/o left low back pain without radicular leg pain, paresthesias, leg weakness or incontinence. Attempted heat and advil with only mild improvement. Pain worse with position change. No injury or trauma but believes pain may have occurred after carrying vacuum downstairs. No other alleviating or exacerbating factors. No other complaints.   Past Medical History  Diagnosis Date  . History of DVT (deep vein thrombosis)   . Hyperlipidemia   . Hypertension   . DJD (degenerative joint disease), lumbar   . Bouchard nodes (DJD hand)   . Allergic rhinitis   . Diabetes mellitus type II   . Anxiety   . Glaucoma    Past Surgical History  Procedure Date  . Knee arthroscopy   . Cholecystectomy   . Abdominal hysterectomy     reports that she has quit smoking. She has never used smokeless tobacco. She reports that she does not drink alcohol. Her drug history not on file. family history includes Breast cancer in her sister and Stroke in an unspecified family member. No Known Allergies   Review of Systems see hpi     Objective:   Physical Exam  Nursing note and vitals reviewed. Constitutional: She appears well-developed and well-nourished. No distress.  HENT:  Head: Normocephalic and atraumatic.  Eyes: Conjunctivae are normal. No scleral icterus.  Musculoskeletal:       Midline ls spine NT without bony abn. Left paraspinal muscle with mild spasm and tenderness. Able to wt bear and ambulate without assistance  Neurological: She is alert.  Skin: She is not diaphoretic.  Psychiatric: She has a normal mood and affect.          Assessment & Plan:

## 2011-07-16 NOTE — Telephone Encounter (Signed)
Patient seen for office visit 07/16/2011, concerns have been addressed.

## 2011-07-16 NOTE — Assessment & Plan Note (Signed)
Continue otc nsaid and topical heat. Attempt flexeril prn and cautioned regarding possible sedating effect.

## 2011-07-29 ENCOUNTER — Other Ambulatory Visit: Payer: Self-pay | Admitting: Internal Medicine

## 2011-07-29 DIAGNOSIS — E119 Type 2 diabetes mellitus without complications: Secondary | ICD-10-CM

## 2011-07-29 LAB — BASIC METABOLIC PANEL
CO2: 26 mEq/L (ref 19–32)
Chloride: 104 mEq/L (ref 96–112)
Creat: 0.86 mg/dL (ref 0.50–1.10)
Glucose, Bld: 117 mg/dL — ABNORMAL HIGH (ref 70–99)
Sodium: 141 mEq/L (ref 135–145)

## 2011-08-02 ENCOUNTER — Telehealth: Payer: Self-pay | Admitting: Internal Medicine

## 2011-08-02 NOTE — Telephone Encounter (Signed)
Labs drawn two business days ago. Please reinforce that all labs will be notified WHEN reviewed. Calling two business days later does not speed up the process.

## 2011-08-02 NOTE — Telephone Encounter (Signed)
Call placed to patient at [phone], no answer. A detailed voice message was left informing patient after labs have been reviewed by provider a call would be placed to her. Message left for patient to call back if any additional questions.

## 2011-08-02 NOTE — Telephone Encounter (Signed)
Patient is requesting lab results.

## 2011-08-05 NOTE — Telephone Encounter (Signed)
Actually has f/u appt tomorrow

## 2011-08-06 ENCOUNTER — Encounter: Payer: Self-pay | Admitting: Internal Medicine

## 2011-08-06 ENCOUNTER — Telehealth: Payer: Self-pay | Admitting: Internal Medicine

## 2011-08-06 ENCOUNTER — Ambulatory Visit (INDEPENDENT_AMBULATORY_CARE_PROVIDER_SITE_OTHER): Payer: Medicare Other | Admitting: Internal Medicine

## 2011-08-06 DIAGNOSIS — E119 Type 2 diabetes mellitus without complications: Secondary | ICD-10-CM

## 2011-08-06 DIAGNOSIS — Z23 Encounter for immunization: Secondary | ICD-10-CM

## 2011-08-06 DIAGNOSIS — M549 Dorsalgia, unspecified: Secondary | ICD-10-CM

## 2011-08-06 DIAGNOSIS — E785 Hyperlipidemia, unspecified: Secondary | ICD-10-CM

## 2011-08-06 MED ORDER — METFORMIN HCL 500 MG PO TABS: 500.0000 mg | ORAL_TABLET | Freq: Two times a day (BID) | ORAL | Status: DC

## 2011-08-06 NOTE — Assessment & Plan Note (Signed)
Stop janumet due to cost. Attempt metformin 500mg  bid. Monitor fsbs. Obtain chem7 and a1c prior to next visit

## 2011-08-06 NOTE — Assessment & Plan Note (Signed)
Hx suggests possible mild radicular component. Neurologically nonfocal. Attempt otc nsaids prn and monitor.

## 2011-08-06 NOTE — Telephone Encounter (Signed)
Lab orders entered for January 2013 for [facility]

## 2011-08-06 NOTE — Patient Instructions (Signed)
Please schedule fasting labs prior to next appointment : chem7, a1c (250.0) and lipid/lft (272.4)

## 2011-08-06 NOTE — Progress Notes (Signed)
  Subjective:    Patient ID: Heather Roberts, female    DOB: 24-Sep-1946, 65 y.o.   MRN: [id number]  HPI Pt presents to clinic for followup of multiple medical problems. janumet dose reduced to qd and a1c rose only to 6.3. Wt stable and bp normotensive. Notes intermittent right lbp to lower buttock but no leg radiation, paresthesias or weakness. No alleviating or exacerbating factors. utd with gyn. No other complaints.  Past Medical History  Diagnosis Date  . History of DVT (deep vein thrombosis)   . Hyperlipidemia   . Hypertension   . DJD (degenerative joint disease), lumbar   . Bouchard nodes (DJD hand)   . Allergic rhinitis   . Diabetes mellitus type II   . Anxiety   . Glaucoma    Past Surgical History  Procedure Date  . Knee arthroscopy   . Cholecystectomy   . Abdominal hysterectomy     reports that she has quit smoking. She has never used smokeless tobacco. She reports that she does not drink alcohol. Her drug history not on file. family history includes Breast cancer in her sister and Stroke in an unspecified family member. No Known Allergies   Review of Systems see hpi     Objective:   Physical Exam  Nursing note and vitals reviewed. Constitutional: She appears well-developed and well-nourished. No distress.  HENT:  Head: Normocephalic and atraumatic.  Eyes: Conjunctivae are normal. No scleral icterus.  Musculoskeletal:       RLE strength 5/5. Gait nl.  Skin: Skin is warm and dry. She is not diaphoretic.       Diabetic foot exam: +2 dp pulses bilaterally. No diabetic wound, ulcerations or significant callousing. Monofilament nl bilaterally.          Assessment & Plan:

## 2011-08-06 NOTE — Telephone Encounter (Signed)
Patient has appt with Dr. Rodena Medin on 10/29/11. Please order labs for one week prior. Patient will be going to [facility])  Chem7, A1C 250.0, lipid/lft 272.4

## 2011-08-06 NOTE — Assessment & Plan Note (Signed)
Stable. Obtain lipid/lft prior to next visit. 

## 2011-10-21 DIAGNOSIS — Z135 Encounter for screening for eye and ear disorders: Secondary | ICD-10-CM | POA: Diagnosis not present

## 2011-10-29 ENCOUNTER — Ambulatory Visit (INDEPENDENT_AMBULATORY_CARE_PROVIDER_SITE_OTHER): Payer: Medicare Other | Admitting: Internal Medicine

## 2011-10-29 ENCOUNTER — Telehealth: Payer: Self-pay | Admitting: Internal Medicine

## 2011-10-29 ENCOUNTER — Encounter: Payer: Self-pay | Admitting: Internal Medicine

## 2011-10-29 ENCOUNTER — Other Ambulatory Visit: Payer: Self-pay | Admitting: *Deleted

## 2011-10-29 VITALS — BP 130/80 | HR 76 | Temp 98.5°F | Resp 18 | Ht 61.0 in | Wt 178.0 lb

## 2011-10-29 DIAGNOSIS — E119 Type 2 diabetes mellitus without complications: Secondary | ICD-10-CM | POA: Diagnosis not present

## 2011-10-29 DIAGNOSIS — E785 Hyperlipidemia, unspecified: Secondary | ICD-10-CM

## 2011-10-29 DIAGNOSIS — Z13820 Encounter for screening for osteoporosis: Secondary | ICD-10-CM | POA: Diagnosis not present

## 2011-10-29 DIAGNOSIS — I10 Essential (primary) hypertension: Secondary | ICD-10-CM

## 2011-10-29 NOTE — Patient Instructions (Signed)
Please schedule your bone density at your convenience Also schedule labs prior to your next visit chem7, a1c 250.0

## 2011-10-29 NOTE — Assessment & Plan Note (Signed)
Schedule bmd 

## 2011-10-29 NOTE — Progress Notes (Signed)
  Subjective:    Patient ID: Heather Roberts, female    DOB: 15-Dec-1945, 66 y.o.   MRN: [id number]  HPI Pt presents to clinic for followup of multiple medical problems. S/p change from janumet to metformin. Tolerating without adverse effect-notes more hunger since change. fsbs range typically 94-129 without hypoglycemia. Eye exam utd 1/13. Mammogram scheduled. Requests bmd as states last one was >2 years ago. No other complaints.  Past Medical History  Diagnosis Date  . History of DVT (deep vein thrombosis)   . Hyperlipidemia   . Hypertension   . DJD (degenerative joint disease), lumbar   . Bouchard nodes (DJD hand)   . Allergic rhinitis   . Diabetes mellitus type II   . Anxiety   . Glaucoma    Past Surgical History  Procedure Date  . Knee arthroscopy   . Cholecystectomy   . Abdominal hysterectomy     reports that she has quit smoking. She has never used smokeless tobacco. She reports that she does not drink alcohol. Her drug history not on file. family history includes Breast cancer in her sister and Stroke in an unspecified family member. No Known Allergies    Review of Systems see hpi     Objective:   Physical Exam  Nursing note and vitals reviewed. Constitutional: She appears well-developed and well-nourished. No distress.  HENT:  Head: Normocephalic and atraumatic.  Right Ear: External ear normal.  Left Ear: External ear normal.  Eyes: Conjunctivae are normal. No scleral icterus.  Neck: Neck supple.  Cardiovascular: Normal rate, regular rhythm and normal heart sounds.  Exam reveals no gallop and no friction rub.   No murmur heard. Pulmonary/Chest: Effort normal and breath sounds normal. No respiratory distress. She has no wheezes. She has no rales.  Neurological: She is alert.  Skin: Skin is warm and dry. She is not diaphoretic.  Psychiatric: She has a normal mood and affect.          Assessment & Plan:

## 2011-10-29 NOTE — Assessment & Plan Note (Signed)
Good control s/p medication change. Obtain chem7 and a1c.

## 2011-10-29 NOTE — Telephone Encounter (Signed)
Lab orders entered for April 2013. 

## 2011-10-29 NOTE — Assessment & Plan Note (Signed)
Obtain lipid/lft. Maintained on low dose statin.

## 2011-10-30 LAB — BASIC METABOLIC PANEL
CO2: 25 mEq/L (ref 19–32)
Glucose, Bld: 114 mg/dL — ABNORMAL HIGH (ref 70–99)
Potassium: 4.9 mEq/L (ref 3.5–5.3)
Sodium: 139 mEq/L (ref 135–145)

## 2011-10-30 LAB — LIPID PANEL
HDL: 57 mg/dL (ref >39)
LDL Cholesterol: 93 mg/dL (ref 0–99)
Triglycerides: 149 mg/dL (ref <150)
VLDL: 30 mg/dL (ref 0–40)

## 2011-10-30 LAB — HEPATIC FUNCTION PANEL
Albumin: 4.4 g/dL (ref 3.5–5.2)
Total Protein: 7.5 g/dL (ref 6.0–8.3)

## 2011-11-03 ENCOUNTER — Ambulatory Visit (INDEPENDENT_AMBULATORY_CARE_PROVIDER_SITE_OTHER)
Admission: RE | Admit: 2011-11-03 | Discharge: 2011-11-03 | Disposition: A | Discharge status: Home / Self Care | Payer: Medicare Other | Source: Ambulatory Visit

## 2011-11-03 DIAGNOSIS — Z13820 Encounter for screening for osteoporosis: Secondary | ICD-10-CM

## 2011-11-10 ENCOUNTER — Encounter: Payer: Self-pay | Admitting: Internal Medicine

## 2011-11-29 DIAGNOSIS — Z1231 Encounter for screening mammogram for malignant neoplasm of breast: Secondary | ICD-10-CM | POA: Diagnosis not present

## 2012-01-12 ENCOUNTER — Other Ambulatory Visit: Payer: Self-pay | Admitting: *Deleted

## 2012-01-12 DIAGNOSIS — I10 Essential (primary) hypertension: Secondary | ICD-10-CM | POA: Diagnosis not present

## 2012-01-12 DIAGNOSIS — E119 Type 2 diabetes mellitus without complications: Secondary | ICD-10-CM

## 2012-01-12 LAB — BASIC METABOLIC PANEL
Chloride: 101 mEq/L (ref 96–112)
Potassium: 4.3 mEq/L (ref 3.5–5.3)
Sodium: 139 mEq/L (ref 135–145)

## 2012-01-13 LAB — HEMOGLOBIN A1C
Hgb A1c MFr Bld: 6.2 % — ABNORMAL HIGH (ref <5.7)
Mean Plasma Glucose: 131 mg/dL — ABNORMAL HIGH (ref <117)

## 2012-01-21 ENCOUNTER — Ambulatory Visit (INDEPENDENT_AMBULATORY_CARE_PROVIDER_SITE_OTHER): Payer: BC Managed Care – PPO | Admitting: Internal Medicine

## 2012-01-21 ENCOUNTER — Telehealth: Payer: Self-pay | Admitting: Internal Medicine

## 2012-01-21 ENCOUNTER — Encounter: Payer: Self-pay | Admitting: Internal Medicine

## 2012-01-21 VITALS — BP 124/80 | HR 64 | Temp 98.0°F | Resp 18 | Ht 61.0 in | Wt 181.0 lb

## 2012-01-21 DIAGNOSIS — M79646 Pain in unspecified finger(s): Secondary | ICD-10-CM | POA: Insufficient documentation

## 2012-01-21 DIAGNOSIS — M79609 Pain in unspecified limb: Secondary | ICD-10-CM | POA: Diagnosis not present

## 2012-01-21 DIAGNOSIS — H61899 Other specified disorders of external ear, unspecified ear: Secondary | ICD-10-CM

## 2012-01-21 DIAGNOSIS — E119 Type 2 diabetes mellitus without complications: Secondary | ICD-10-CM | POA: Diagnosis not present

## 2012-01-21 DIAGNOSIS — E785 Hyperlipidemia, unspecified: Secondary | ICD-10-CM

## 2012-01-21 NOTE — Assessment & Plan Note (Signed)
Attempt aleve bid x5days followed by prn with food. Suspect tendonitis. Followup if no improvement or worsening.

## 2012-01-21 NOTE — Assessment & Plan Note (Signed)
Attempt hydrocortisone applied to cotton swab to outer canal gently qd for 5-7 days

## 2012-01-21 NOTE — Patient Instructions (Signed)
Please schedule chem7, a1c, urine microalbumin 250.0 and lipid/lft 272.4 prior to next visit 

## 2012-01-21 NOTE — Assessment & Plan Note (Signed)
Good control. Decrease fsbs checks to 3x/wk. Increase regular exercise and lose 10lbs before next visit.

## 2012-01-21 NOTE — Telephone Encounter (Signed)
Lab order entered for September 2013. 

## 2012-01-21 NOTE — Progress Notes (Signed)
  Subjective:    Patient ID: Heather Roberts, female    DOB: 1946-03-23, 66 y.o.   MRN: [id number]  HPI Pt presents to clinic for followup of multiple medical problems. Notes left hand soreness recently without injury/trauma. Location along ventral lower thumb and dorsal 2nd/3rd mcp tendon lines. Taking no medications for the problem. Pain worse with gripping. No other alleviating or exacerbating factors. Has left ear irritation and itching without injury, drainage or pain. Reviewed good diabetic control.   Past Medical History  Diagnosis Date  . History of DVT (deep vein thrombosis)   . Hyperlipidemia   . Hypertension   . DJD (degenerative joint disease), lumbar   . Bouchard nodes (DJD hand)   . Allergic rhinitis   . Diabetes mellitus type II   . Anxiety   . Glaucoma    Past Surgical History  Procedure Date  . Knee arthroscopy   . Cholecystectomy   . Abdominal hysterectomy     reports that she has quit smoking. She has never used smokeless tobacco. She reports that she does not drink alcohol. Her drug history not on file. family history includes Breast cancer in her sister and Stroke in an unspecified family member. No Known Allergies    Review of Systems see hpi     Objective:   Physical Exam  Nursing note and vitals reviewed. Constitutional: She appears well-developed and well-nourished. No distress.  HENT:  Head: Normocephalic and atraumatic.  Right Ear: Tympanic membrane, external ear and ear canal normal.  Left Ear: Tympanic membrane and external ear normal.       Left canal with sl irritation of skin. No wound/drainage. Sl dry/flaky skin near canal opening  Eyes: Conjunctivae are normal. No scleral icterus.  Skin: She is not diaphoretic.         Assessment & Plan:

## 2012-01-28 ENCOUNTER — Other Ambulatory Visit: Payer: Self-pay | Admitting: Internal Medicine

## 2012-01-31 NOTE — Telephone Encounter (Signed)
Rx refill sent to pharmacy. 

## 2012-03-05 ENCOUNTER — Other Ambulatory Visit: Payer: Self-pay | Admitting: Internal Medicine

## 2012-03-06 NOTE — Telephone Encounter (Signed)
Refill sent to pharmacy.   

## 2012-04-17 ENCOUNTER — Telehealth: Payer: Self-pay | Admitting: Internal Medicine

## 2012-04-17 NOTE — Telephone Encounter (Signed)
Patient would like to know what multi-vitamin Dr. Rodena Medin recommends for her to take?

## 2012-04-17 NOTE — Telephone Encounter (Signed)
Most complete vitamins or multi-vitamins should be fine. They typically have very similar contents and dosages. Centrum is a popular brand but may be more costly

## 2012-04-18 NOTE — Telephone Encounter (Signed)
I spoke with pt and she is going to try the Centrum vitamin.

## 2012-06-14 DIAGNOSIS — E119 Type 2 diabetes mellitus without complications: Secondary | ICD-10-CM | POA: Diagnosis not present

## 2012-06-14 DIAGNOSIS — E785 Hyperlipidemia, unspecified: Secondary | ICD-10-CM | POA: Diagnosis not present

## 2012-06-14 NOTE — Addendum Note (Signed)
Addended by: Mervin Kung A on: 06/14/2012 09:47 AM   Modules accepted: Orders

## 2012-06-14 NOTE — Telephone Encounter (Signed)
Pt presented to the lab, future orders released. 

## 2012-06-15 LAB — LIPID PANEL
Cholesterol: 207 mg/dL — ABNORMAL HIGH (ref 0–200)
LDL Cholesterol: 113 mg/dL — ABNORMAL HIGH (ref 0–99)
Total CHOL/HDL Ratio: 3.5 Ratio
Triglycerides: 170 mg/dL — ABNORMAL HIGH (ref <150)

## 2012-06-15 LAB — BASIC METABOLIC PANEL
CO2: 27 mEq/L (ref 19–32)
Calcium: 9.6 mg/dL (ref 8.4–10.5)
Chloride: 102 mEq/L (ref 96–112)
Sodium: 139 mEq/L (ref 135–145)

## 2012-06-15 LAB — HEPATIC FUNCTION PANEL
Alkaline Phosphatase: 97 U/L (ref 39–117)
Indirect Bilirubin: 0.4 mg/dL (ref 0.0–0.9)
Total Protein: 7.1 g/dL (ref 6.0–8.3)

## 2012-06-15 LAB — HEMOGLOBIN A1C
Hgb A1c MFr Bld: 6.3 % — ABNORMAL HIGH (ref <5.7)
Mean Plasma Glucose: 134 mg/dL — ABNORMAL HIGH (ref <117)

## 2012-06-15 LAB — MICROALBUMIN / CREATININE URINE RATIO
Creatinine, Urine: 21.2 mg/dL
Microalb, Ur: 0.5 mg/dL (ref 0.00–1.89)

## 2012-06-23 ENCOUNTER — Encounter: Payer: Self-pay | Admitting: Internal Medicine

## 2012-06-23 ENCOUNTER — Ambulatory Visit (INDEPENDENT_AMBULATORY_CARE_PROVIDER_SITE_OTHER): Payer: Medicare Other | Admitting: Internal Medicine

## 2012-06-23 ENCOUNTER — Telehealth: Payer: Self-pay | Admitting: Internal Medicine

## 2012-06-23 VITALS — BP 114/78 | HR 68 | Temp 98.0°F | Resp 14 | Ht 61.0 in | Wt 179.0 lb

## 2012-06-23 DIAGNOSIS — E119 Type 2 diabetes mellitus without complications: Secondary | ICD-10-CM

## 2012-06-23 DIAGNOSIS — Z23 Encounter for immunization: Secondary | ICD-10-CM

## 2012-06-23 DIAGNOSIS — E785 Hyperlipidemia, unspecified: Secondary | ICD-10-CM

## 2012-06-23 DIAGNOSIS — M549 Dorsalgia, unspecified: Secondary | ICD-10-CM | POA: Diagnosis not present

## 2012-06-23 DIAGNOSIS — I10 Essential (primary) hypertension: Secondary | ICD-10-CM

## 2012-06-23 MED ORDER — METAXALONE 800 MG PO TABS: ORAL_TABLET | ORAL | Status: DC

## 2012-06-23 MED ORDER — CYCLOBENZAPRINE HCL 10 MG PO TABS: ORAL_TABLET | ORAL | Status: DC

## 2012-06-23 NOTE — Assessment & Plan Note (Signed)
Spasm noted on exam. Attempt low dose skelaxin prn. Cautioned re possible sedating effect. Followup if no improvement or worsening.

## 2012-06-23 NOTE — Assessment & Plan Note (Signed)
Normotensive and stable. Continue current regimen. Monitor bp as outpt and followup in clinic as scheduled.  

## 2012-06-23 NOTE — Assessment & Plan Note (Signed)
Stable. Good control. Continue current regimen. 

## 2012-06-23 NOTE — Telephone Encounter (Signed)
Please schedule fasting labs prior to next visit  Cbc, chem7, a1c-250.00 and lipid/lft-272.4  Patient has upcoming visit 09/22/12. She will be going to [facility] lab

## 2012-06-23 NOTE — Assessment & Plan Note (Signed)
Mildly suboptimal. States dietary indiscretion. Low fat diet/exercise. Continue current statin dose. Recheck lipid with next visit

## 2012-06-23 NOTE — Patient Instructions (Signed)
Please schedule fasting labs prior to next visit Cbc, chem7, a1c-250.00 and lipid/lft-272.4 

## 2012-06-23 NOTE — Progress Notes (Signed)
  Subjective:    Patient ID: Heather Roberts, female    DOB: 1946/05/28, 66 y.o.   MRN: [id number]  HPI Pt presents to clinic for followup of multiple medical problems. Notes two day h/o left lbp without leg radiation. No injury or leg weakness. Chol higher and states has had dietary indiscretion. Enrolled in exercise class.  Past Medical History  Diagnosis Date  . History of DVT (deep vein thrombosis)   . Hyperlipidemia   . Hypertension   . DJD (degenerative joint disease), lumbar   . Bouchard nodes (DJD hand)   . Allergic rhinitis   . Diabetes mellitus type II   . Anxiety   . Glaucoma    Past Surgical History  Procedure Date  . Knee arthroscopy   . Cholecystectomy   . Abdominal hysterectomy     reports that she has quit smoking. She has never used smokeless tobacco. She reports that she does not drink alcohol. Her drug history not on file. family history includes Breast cancer in her sister and Stroke in an unspecified family member. No Known Allergies    Review of Systems see hpi     Objective:   Physical Exam  Physical Exam  Nursing note and vitals reviewed. Constitutional: Appears well-developed and well-nourished. No distress.  HENT:  Head: Normocephalic and atraumatic.  Right Ear: External ear normal. TM nl  Left Ear: External ear normal. TM nl Eyes: Conjunctivae are normal. No scleral icterus.  Neck: Neck supple. Carotid bruit is not present.  Cardiovascular: Normal rate, regular rhythm and normal heart sounds.  Exam reveals no gallop and no friction rub.   No murmur heard. Pulmonary/Chest: Effort normal and breath sounds normal. No respiratory distress. He has no wheezes. no rales.  Lymphadenopathy:    He has no cervical adenopathy.  Neurological:Alert.  Skin: Skin is warm and dry. Not diaphoretic.  Psychiatric: Has a normal mood and affect.  MSK: no midline ls tenderness. Gait nl. Left lumbar paraspinal spasm and tenderness noted      Assessment  & Plan:

## 2012-07-14 ENCOUNTER — Other Ambulatory Visit: Payer: Self-pay | Admitting: Internal Medicine

## 2012-07-14 MED ORDER — DICLOFENAC SODIUM 75 MG PO TBEC: DELAYED_RELEASE_TABLET | ORAL | Status: DC

## 2012-07-14 NOTE — Telephone Encounter (Signed)
DENIED-new request sent #[id number]

## 2012-07-14 NOTE — Progress Notes (Signed)
During her husband's visit she mentions one week h/o right medial knee pain and tenderness. No injury. Using heat/cold and ibuprofen with mild improvement only. No instability or falls. Exam shows no erythema, warmth, effusion. +tenderness medial knee without bony abn. Gait nl. Recommend continued heat/cold prn, stop ibuprofen and attempt short course of voltaren with food and no other nsaid. Given h/o knee surgery recommend ortho consult if sx's persist.

## 2012-07-19 ENCOUNTER — Encounter: Payer: Self-pay | Admitting: Obstetrics & Gynecology

## 2012-07-19 ENCOUNTER — Ambulatory Visit (INDEPENDENT_AMBULATORY_CARE_PROVIDER_SITE_OTHER): Payer: BC Managed Care – PPO | Admitting: Obstetrics & Gynecology

## 2012-07-19 VITALS — BP 150/73 | HR 72 | Temp 98.6°F | Resp 16 | Ht 61.0 in | Wt 179.0 lb

## 2012-07-19 DIAGNOSIS — Z0000 Encounter for general adult medical examination without abnormal findings: Secondary | ICD-10-CM | POA: Diagnosis not present

## 2012-07-19 NOTE — Progress Notes (Signed)
Subjective:    Heather Roberts is a 66 y.o. female who presents for an annual exam. The patient has no complaints today. The patient is sexually active. GYN screening history: last pap: was normal. The patient wears seatbelts: yes. The patient participates in regular exercise: yes. Has the patient ever been transfused or tattooed?: no. The patient reports that there is not domestic violence in her life.   Menstrual History: OB History    Grav Para Term Preterm Abortions TAB SAB Ect Mult Living   3 3 3       3       Menarche age: 67 No LMP recorded. Patient has had a hysterectomy.    The following portions of the patient's history were reviewed and updated as appropriate: allergies, current medications, past family history, past medical history, past social history, past surgical history and problem list.  Review of Systems A comprehensive review of systems was negative. She is from [place], [place]. She has been married for 23 years. She is UTD with her mammogram (2/13), DEXA (2012 and normal), and colonoscopy.   Objective:    BP 150/73  Pulse 72  Temp 98.6 F (37 C) (Oral)  Resp 16  Ht 5\' 1"  (1.549 m)  Wt 179 lb (81.194 kg)  BMI 33.82 kg/m2  General Appearance:    Alert, cooperative, no distress, appears stated age  Head:    Normocephalic, without obvious abnormality, atraumatic  Eyes:    PERRL, conjunctiva/corneas clear, EOM's intact, fundi    benign, both eyes  Ears:    Normal TM's and external ear canals, both ears  Nose:   Nares normal, septum midline, mucosa normal, no drainage    or sinus tenderness  Throat:   Lips, mucosa, and tongue normal; teeth and gums normal  Neck:   Supple, symmetrical, trachea midline, no adenopathy;    thyroid:  no enlargement/tenderness/nodules; no carotid   bruit or JVD  Back:     Symmetric, no curvature, ROM normal, no CVA tenderness  Lungs:     Clear to auscultation bilaterally, respirations unlabored  Chest Wall:    No tenderness or  deformity   Heart:    Regular rate and rhythm, S1 and S2 normal, no murmur, rub   or gallop  Breast Exam:    No tenderness, masses, or nipple abnormality  Abdomen:     Soft, non-tender, bowel sounds active all four quadrants,    no masses, no organomegaly  Genitalia:    Normal female without lesion, discharge or tenderness, moderate atrophy, normal vagina and bimanual     Extremities:   Extremities normal, atraumatic, no cyanosis or edema  Pulses:   2+ and symmetric all extremities  Skin:   Skin color, texture, turgor normal, no rashes or lesions  Lymph nodes:   Cervical, supraclavicular, and axillary nodes normal  Neurologic:   CNII-XII intact, normal strength, sensation and reflexes    throughout  .    Assessment:    Healthy female exam.    Plan:     All questions answered. Breast self exam technique reviewed and patient encouraged to perform self-exam monthly.

## 2012-08-14 ENCOUNTER — Telehealth: Payer: Self-pay | Admitting: Internal Medicine

## 2012-08-14 MED ORDER — LOSARTAN POTASSIUM 50 MG PO TABS: ORAL_TABLET | ORAL | Status: DC

## 2012-08-14 MED ORDER — SIMVASTATIN 10 MG PO TABS: ORAL_TABLET | ORAL | Status: DC

## 2012-08-14 NOTE — Telephone Encounter (Signed)
Rx[s] to [facility] pharmacy/[facility]

## 2012-08-14 NOTE — Telephone Encounter (Signed)
Refill- simvastatin 10mg  tablets. Take one tablet by mouth at bedtime. Qty 30 last fill 8.20.13

## 2012-08-14 NOTE — Telephone Encounter (Signed)
Refill- losartan 50mg  tablets. Take one tablet by mouth daily. Qty 30 last fill 8.20.13

## 2012-08-14 NOTE — Telephone Encounter (Signed)
Patient called wants to make sure her meds are called to [facility]  Not pharmacy here

## 2012-08-24 NOTE — Telephone Encounter (Signed)
Future orders entered and copy given to the lab. 

## 2012-09-18 DIAGNOSIS — E119 Type 2 diabetes mellitus without complications: Secondary | ICD-10-CM | POA: Diagnosis not present

## 2012-09-18 DIAGNOSIS — E785 Hyperlipidemia, unspecified: Secondary | ICD-10-CM | POA: Diagnosis not present

## 2012-09-18 LAB — CBC WITH DIFFERENTIAL/PLATELET
Basophils Absolute: 0 10*3/uL (ref 0.0–0.1)
Eosinophils Absolute: 0.1 10*3/uL (ref 0.0–0.7)
Eosinophils Relative: 2 % (ref 0–5)
MCH: 30.8 pg (ref 26.0–34.0)
MCHC: 34.5 g/dL (ref 30.0–36.0)
MCV: 89.4 fL (ref 78.0–100.0)
Platelets: 303 10*3/uL (ref 150–400)
RDW: 14.1 % (ref 11.5–15.5)

## 2012-09-18 NOTE — Telephone Encounter (Signed)
Lab orders released/[facility] 

## 2012-09-18 NOTE — Addendum Note (Signed)
Addended by: Regis Bill on: 09/18/2012 11:56 AM   Modules accepted: Orders

## 2012-09-19 LAB — BASIC METABOLIC PANEL
CO2: 28 mEq/L (ref 19–32)
Calcium: 9.5 mg/dL (ref 8.4–10.5)
Creat: 0.96 mg/dL (ref 0.50–1.10)
Glucose, Bld: 108 mg/dL — ABNORMAL HIGH (ref 70–99)

## 2012-09-19 LAB — HEPATIC FUNCTION PANEL
AST: 22 U/L (ref 0–37)
Alkaline Phosphatase: 104 U/L (ref 39–117)
Bilirubin, Direct: 0.1 mg/dL (ref 0.0–0.3)
Total Bilirubin: 0.6 mg/dL (ref 0.3–1.2)

## 2012-09-19 LAB — LIPID PANEL: Total CHOL/HDL Ratio: 3.3 Ratio

## 2012-09-22 ENCOUNTER — Encounter: Payer: Self-pay | Admitting: Internal Medicine

## 2012-09-22 ENCOUNTER — Ambulatory Visit (INDEPENDENT_AMBULATORY_CARE_PROVIDER_SITE_OTHER): Payer: BC Managed Care – PPO | Admitting: Internal Medicine

## 2012-09-22 ENCOUNTER — Telehealth: Payer: Self-pay | Admitting: Internal Medicine

## 2012-09-22 VITALS — BP 120/74 | HR 66 | Temp 98.2°F | Resp 16 | Ht 61.0 in | Wt 182.1 lb

## 2012-09-22 DIAGNOSIS — M2550 Pain in unspecified joint: Secondary | ICD-10-CM | POA: Diagnosis not present

## 2012-09-22 DIAGNOSIS — E119 Type 2 diabetes mellitus without complications: Secondary | ICD-10-CM

## 2012-09-22 MED ORDER — DICLOFENAC SODIUM 1 % TD GEL: 4.0000 g | Freq: Four times a day (QID) | TRANSDERMAL | Status: DC | PRN

## 2012-09-22 NOTE — Patient Instructions (Signed)
Please schedule labs prior to next visit Chem7, a1c-250.00 

## 2012-09-22 NOTE — Telephone Encounter (Signed)
Lab order week of 12-09-2012 Chem7, a1c-250.00

## 2012-09-22 NOTE — Progress Notes (Signed)
  Subjective:    Patient ID: Heather Roberts, female    DOB: 05-13-1946, 65 y.o.   MRN: [id number]  HPI Pt presents to clinic for followup of multiple medical problems. Recently been helping a friend. Has been climbing stairs and doing dishes manually. Since that time notes bilateral thumb base pain and increased knee pain. Reviewed a1c 6.5 from 6.3. ldl chol improved.  Past Medical History  Diagnosis Date  . History of DVT (deep vein thrombosis)   . Hyperlipidemia   . Hypertension   . DJD (degenerative joint disease), lumbar   . Bouchard nodes (DJD hand)   . Allergic rhinitis   . Diabetes mellitus type II   . Anxiety   . Glaucoma(365)   . Fibroids    Past Surgical History  Procedure Date  . Knee arthroscopy   . Cholecystectomy   . Abdominal hysterectomy     reports that she has quit smoking. Her smoking use included Cigarettes. She has a 2.4 pack-year smoking history. She has never used smokeless tobacco. She reports that she does not drink alcohol or use illicit drugs. family history includes Breast cancer in her sister; Diabetes in her mother and paternal grandmother; Heart disease in an unspecified family member; and Stroke in an unspecified family member. No Known Allergies    Review of Systems see hpi     Objective:   Physical Exam  Physical Exam  Nursing note and vitals reviewed. Constitutional: Appears well-developed and well-nourished. No distress.  HENT:  Head: Normocephalic and atraumatic.  Right Ear: External ear normal.  Left Ear: External ear normal.  Eyes: Conjunctivae are normal. No scleral icterus.  Neck: Neck supple. Carotid bruit is not present.  Cardiovascular: Normal rate, regular rhythm and normal heart sounds.  Exam reveals no gallop and no friction rub.   No murmur heard. Pulmonary/Chest: Effort normal and breath sounds normal. No respiratory distress. He has no wheezes. no rales.  Lymphadenopathy:    He has no cervical adenopathy.   Neurological:Alert.  Skin: Skin is warm and dry. Not diaphoretic.  Psychiatric: Has a normal mood and affect.        Assessment & Plan:

## 2012-09-23 DIAGNOSIS — M2550 Pain in unspecified joint: Secondary | ICD-10-CM | POA: Insufficient documentation

## 2012-09-23 NOTE — Assessment & Plan Note (Signed)
Recent overuse. Attempt voltaren gel prn.

## 2012-09-23 NOTE — Assessment & Plan Note (Signed)
Diabetic diet, exercise and weight loss.

## 2012-10-11 ENCOUNTER — Other Ambulatory Visit: Payer: Self-pay | Admitting: Internal Medicine

## 2012-10-12 NOTE — Telephone Encounter (Signed)
Rx to pharmacy/[facility] 

## 2012-12-02 DIAGNOSIS — Z1231 Encounter for screening mammogram for malignant neoplasm of breast: Secondary | ICD-10-CM | POA: Diagnosis not present

## 2012-12-12 ENCOUNTER — Telehealth: Payer: Self-pay

## 2012-12-12 LAB — BASIC METABOLIC PANEL
CO2: 28 mEq/L (ref 19–32)
Calcium: 9.8 mg/dL (ref 8.4–10.5)
Glucose, Bld: 109 mg/dL — ABNORMAL HIGH (ref 70–99)
Sodium: 138 mEq/L (ref 135–145)

## 2012-12-12 NOTE — Telephone Encounter (Signed)
Patient came in today for a lab appt. I ordered labs according to Dr Ty Hilts last note

## 2012-12-13 LAB — HEMOGLOBIN A1C
Hgb A1c MFr Bld: 6.7 % — ABNORMAL HIGH (ref <5.7)
Mean Plasma Glucose: 146 mg/dL — ABNORMAL HIGH (ref <117)

## 2012-12-18 ENCOUNTER — Ambulatory Visit (INDEPENDENT_AMBULATORY_CARE_PROVIDER_SITE_OTHER): Payer: BC Managed Care – PPO | Admitting: Family Medicine

## 2012-12-18 ENCOUNTER — Encounter: Payer: Self-pay | Admitting: Family Medicine

## 2012-12-18 VITALS — BP 146/86 | HR 80 | Temp 98.3°F | Ht 61.0 in | Wt 182.1 lb

## 2012-12-18 DIAGNOSIS — R002 Palpitations: Secondary | ICD-10-CM

## 2012-12-18 DIAGNOSIS — R351 Nocturia: Secondary | ICD-10-CM

## 2012-12-18 HISTORY — DX: Nocturia: R35.1

## 2012-12-18 HISTORY — DX: Palpitations: R00.2

## 2012-12-18 LAB — URINALYSIS
Hgb urine dipstick: NEGATIVE
Leukocytes, UA: NEGATIVE
Protein, ur: NEGATIVE mg/dL
Urobilinogen, UA: 0.2 mg/dL (ref 0.0–1.0)

## 2012-12-18 MED ORDER — NITROGLYCERIN 0.4 MG SL SUBL: 0.4000 mg | SUBLINGUAL_TABLET | SUBLINGUAL | Status: DC | PRN

## 2012-12-18 NOTE — Progress Notes (Signed)
Patient ID: Heather Roberts, female   DOB: 1946/05/11, 67 y.o.   MRN: [id number] Heather Roberts [id number] December 06, 1945 12/18/2012      Progress Note-Follow Up  Subjective  Chief Complaint  No chief complaint on file.   HPI  Patient is a 67 year old Caucasian female who is in today for followup. Blood sugars have been generally well-controlled and 90/20 in am. Highest this morning was 148. No frequent polyuria although she did notice nocturia last night. No polydipsia. Has had 2 episodes of weakness in the last week. Last night had a weak spell no palpitations or shortness of breath. 40s ago she had an episode of feeling short of breath having palpitations as well as the left neck and arm pain. Lasted several minutes and resolved. No chest pain or discomfort in the office today. No fevers or chills. No dysuria, GI or GU complaints otherwise noted.  Past Medical History  Diagnosis Date  . History of DVT (deep vein thrombosis)   . Hyperlipidemia   . Hypertension   . DJD (degenerative joint disease), lumbar   . Bouchard nodes (DJD hand)   . Allergic rhinitis   . Diabetes mellitus type II   . Anxiety   . Glaucoma(365)   . Fibroids   . Palpitations 12/18/2012  . Nocturia 12/18/2012    Past Surgical History  Procedure Laterality Date  . Knee arthroscopy    . Cholecystectomy    . Abdominal hysterectomy      Family History  Problem Relation Age of Onset  . Breast cancer Sister     age 23  . Stroke    . Diabetes Paternal Grandmother   . Heart disease    . Diabetes Mother     History   Social History  . Marital Status: Married    Spouse Name: Essick    Number of Children: 3  . Years of Education: N/A   Occupational History  . Homemaker     used to work for [employer]   Social History Main Topics  . Smoking status: Former Smoker -- 0.30 packs/day for 8 years    Types: Cigarettes  . Smokeless tobacco: Never Used  . Alcohol Use: No  . Drug Use: No  . Sexually  Active: Yes -- Female partner(s)   Other Topics Concern  . 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 aspirin 81 MG tablet Take 81 mg by mouth daily.        . diclofenac sodium (VOLTAREN) 1 % GEL Apply 4 g topically 4 (four) times daily as needed.  100 g  4  . losartan (COZAAR) 50 MG tablet TAKE 1 TABLET BY MOUTH DAILY  30 tablet  6  . Magnesium Salicylate Tetrahyd 580 MG TABS Take by mouth as needed.      . metFORMIN (GLUCOPHAGE) 500 MG tablet TAKE 1 TABLET BY MOUTH TWICE DAILY WITH A MEAL  60 tablet  5  . simvastatin (ZOCOR) 10 MG tablet TAKE 1 TABLET BY MOUTH AT BEDTIME  30 tablet  6  . Cholecalciferol (VITAMIN D) 1000 UNITS capsule Take 1,000 Units by mouth daily.        Marland Kitchen  multivitamin-iron-minerals-folic acid (CENTRUM) chewable tablet Chew 1 tablet by mouth daily.      . Probiotic Product (PROBIOTIC DAILY PO) Take by mouth. Digestive Advantage Probiotic.       No current facility-administered medications on file prior to visit.    No Known Allergies  Review of Systems  Review of Systems  Constitutional: Positive for malaise/fatigue. Negative for fever.  HENT: Negative for congestion.   Eyes: Negative for discharge.  Respiratory: Positive for shortness of breath.   Cardiovascular: Positive for chest pain and palpitations. Negative for leg swelling.  Gastrointestinal: Negative for nausea, abdominal pain and diarrhea.  Genitourinary: Positive for frequency. Negative for dysuria, urgency, hematuria and flank pain.  Musculoskeletal: Positive for joint pain. Negative for falls.  Skin: Negative for rash.  Neurological: Negative for loss of consciousness and headaches.  Endo/Heme/Allergies: Negative for polydipsia.  Psychiatric/Behavioral:  Negative for depression and suicidal ideas. The patient is not nervous/anxious and does not have insomnia.     Objective  BP 146/86  Pulse 80  Temp(Src) 98.3 F (36.8 C) (Oral)  Ht 5\' 1"  (1.549 m)  Wt 182 lb 1.3 oz (82.591 kg)  BMI 34.42 kg/m2  SpO2 96%  Physical Exam  Physical Exam  Constitutional: She is oriented to person, place, and time and well-developed, well-nourished, and in no distress. No distress.  HENT:  Head: Normocephalic and atraumatic.  Eyes: Conjunctivae are normal.  Neck: Neck supple. No thyromegaly present.  Cardiovascular: Normal rate, regular rhythm and normal heart sounds.   Pulmonary/Chest: Effort normal and breath sounds normal. She has no wheezes.  Abdominal: She exhibits no distension and no mass.  Musculoskeletal: She exhibits no edema.  Lymphadenopathy:    She has no cervical adenopathy.  Neurological: She is alert and oriented to person, place, and time.  Skin: Skin is warm and dry. No rash noted. She is not diaphoretic.  Psychiatric: Memory, affect and judgment normal.    Lab Results  Component Value Date   TSH 0.77 07/28/2009   Lab Results  Component Value Date   WBC 6.4 09/18/2012   HGB 13.1 09/18/2012   HCT 38.0 09/18/2012   MCV 89.4 09/18/2012   PLT 303 09/18/2012   Lab Results  Component Value Date   CREATININE 0.71 12/12/2012   BUN 11 12/12/2012   NA 138 12/12/2012   K 4.2 12/12/2012   CL 101 12/12/2012   CO2 28 12/12/2012   Lab Results  Component Value Date   ALT 15 09/18/2012   AST 22 09/18/2012   ALKPHOS 104 09/18/2012   BILITOT 0.6 09/18/2012   Lab Results  Component Value Date   CHOL 167 09/18/2012   Lab Results  Component Value Date   HDL 51 09/18/2012   Lab Results  Component Value Date   LDLCALC 81 09/18/2012   Lab Results  Component Value Date   TRIG 174* 09/18/2012   Lab Results  Component Value Date   CHOLHDL 3.3 09/18/2012     Assessment & Plan  HYPERTENSION Improved on recheck, no changes  today  Palpitations Has had 2 episodes of feeling weak and lightheaded in the last week. She's had a sensation of palpitations and shortness of breath with one of those episodes as well as some left arm and neck discomfort. Due to her numerous risk factors will to cardiology for further evaluation. Encouraged daily aspirin and given sublingual nitroglycerin glycerin use when necessary.  HYPERLIPIDEMIA Tolerating Zocor  DIABETES MELLITUS, TYPE II bs this am 148, hgba1c 6.7,  minimize simple carbs  Nocturia Report worsening symptoms

## 2012-12-18 NOTE — Assessment & Plan Note (Signed)
Has had 2 episodes of feeling weak and lightheaded in the last week. She's had a sensation of palpitations and shortness of breath with one of those episodes as well as some left arm and neck discomfort. Due to her numerous risk factors will to cardiology for further evaluation. Encouraged daily aspirin and given sublingual nitroglycerin glycerin use when necessary.

## 2012-12-18 NOTE — Assessment & Plan Note (Signed)
Improved on recheck, no changes today 

## 2012-12-18 NOTE — Assessment & Plan Note (Signed)
Report worsening symptoms

## 2012-12-18 NOTE — Assessment & Plan Note (Signed)
bs this am 148, hgba1c 6.7, minimize simple carbs

## 2012-12-18 NOTE — Assessment & Plan Note (Signed)
Tolerating Zocor

## 2012-12-21 ENCOUNTER — Encounter: Payer: Self-pay | Admitting: Family Medicine

## 2013-01-10 ENCOUNTER — Telehealth: Payer: Self-pay | Admitting: *Deleted

## 2013-01-10 ENCOUNTER — Encounter: Payer: Self-pay | Admitting: Cardiology

## 2013-01-10 ENCOUNTER — Ambulatory Visit (INDEPENDENT_AMBULATORY_CARE_PROVIDER_SITE_OTHER): Payer: Medicare Other | Admitting: Cardiology

## 2013-01-10 ENCOUNTER — Encounter (INDEPENDENT_AMBULATORY_CARE_PROVIDER_SITE_OTHER): Payer: Medicare Other

## 2013-01-10 VITALS — BP 140/82 | HR 78 | Wt 180.0 lb

## 2013-01-10 DIAGNOSIS — R002 Palpitations: Secondary | ICD-10-CM

## 2013-01-10 DIAGNOSIS — G459 Transient cerebral ischemic attack, unspecified: Secondary | ICD-10-CM

## 2013-01-10 DIAGNOSIS — I10 Essential (primary) hypertension: Secondary | ICD-10-CM

## 2013-01-10 DIAGNOSIS — R079 Chest pain, unspecified: Secondary | ICD-10-CM

## 2013-01-10 DIAGNOSIS — E785 Hyperlipidemia, unspecified: Secondary | ICD-10-CM

## 2013-01-10 LAB — TSH: TSH: 1.131 u[IU]/mL (ref 0.350–4.500)

## 2013-01-10 NOTE — Patient Instructions (Addendum)
Your physician recommends that you schedule a follow-up appointment in: 6 WEEKS WITH DR CRENSHAW IN [place]  Your physician has requested that you have a stress echocardiogram. For further information please visit [email or web address] Please follow instruction sheet as given.AT THE [facility] OFFICE   Your physician has requested that you have a carotid duplex. This test is an ultrasound of the carotid arteries in your neck. It looks at blood flow through these arteries that supply the brain with blood. Allow one hour for this exam. There are no restrictions or special instructions.AT THE [facility] OFFICE  Your physician has recommended that you wear an event monitor. Event monitors are medical devices that record the heart's electrical activity. Doctors most often [place] these monitors to diagnose arrhythmias. Arrhythmias are problems with the speed or rhythm of the heartbeat. The monitor is a small, portable device. You can wear one while you do your normal daily activities. This is usually used to diagnose what is causing palpitations/syncope (passing out).WILL BE MAILED TO [facility]  Your physician recommends that you HAVE LAB WORK TODAY

## 2013-01-10 NOTE — Assessment & Plan Note (Signed)
Continue statin. 

## 2013-01-10 NOTE — Assessment & Plan Note (Signed)
Symptoms somewhat atypical but multiple cardiac risk factors. Plan stress echocardiogram. Given history of DVT I will schedule a d-dimer as well.

## 2013-01-10 NOTE — Assessment & Plan Note (Signed)
Patient symptoms seem predominantly related to palpitations. Etiology is unclear. I will arrange a CardioNet to further assess. Note she had transient blurred vision raising the possibility of TIA. Question whether she may have had atrial fibrillation although this is not documented. Continue aspirin. I will also schedule carotid Dopplers. Check TSH.

## 2013-01-10 NOTE — Telephone Encounter (Signed)
21 day event monitor enrolled to be mailed to Pt's home 01/10/13 TK

## 2013-01-10 NOTE — Progress Notes (Signed)
HPI: 67 year old female with no prior cardiac history for evaluation of chest pain and palpitations. She typically does not have dyspnea on exertion, orthopnea, PND, pedal edema, syncope or chest pain. On March 8 while she was riding in her car she developed pain in her left bicep area described as a sharp pain. She also had pain in her left chest also described as a sharp pain. She also felt her heart racing. She had transient "hazy vision". There was no dysarthria or loss of strength/sensation in her extremities. Her symptoms lasted approximately 15 minutes and resolve spontaneously. She subsequently had an episode of palpitations while climbing up her stairs at home that lasted approximately 5 minutes. She has felt somewhat weak since her initial episode but has not had dyspnea or chest pain. There's been no recent travel or leg injury and no recent surgery. Because of the above we were asked to evaluate.  Current Outpatient Prescriptions  Medication Sig Dispense Refill  . aspirin 81 MG tablet Take 81 mg by mouth daily.        . Cholecalciferol (VITAMIN D) 1000 UNITS capsule Take 1,000 Units by mouth. ocassionally      . losartan (COZAAR) 50 MG tablet TAKE 1 TABLET BY MOUTH DAILY  30 tablet  6  . metFORMIN (GLUCOPHAGE) 500 MG tablet TAKE 1 TABLET BY MOUTH TWICE DAILY WITH A MEAL  60 tablet  5  . multivitamin-iron-minerals-folic acid (CENTRUM) chewable tablet Chew 1 tablet by mouth daily.      . nitroGLYCERIN (NITROSTAT) 0.4 MG SL tablet Place 1 tablet (0.4 mg total) under the tongue every 5 (five) minutes as needed for chest pain. Repeat q 5 minutes X 3 doses. If no relief go to ER  25 tablet  1  . Probiotic Product (PROBIOTIC DAILY PO) Take by mouth as needed. Digestive Advantage Probiotic.      . simvastatin (ZOCOR) 10 MG tablet TAKE 1 TABLET BY MOUTH AT BEDTIME  30 tablet  6   No current facility-administered medications for this visit.    No Known Allergies  Past Medical History    Diagnosis Date  . History of DVT (deep vein thrombosis) 1994  . Hyperlipidemia   . Hypertension   . DJD (degenerative joint disease), lumbar   . Bouchard nodes (DJD hand)   . Allergic rhinitis   . Diabetes mellitus type II   . Anxiety   . Glaucoma(365)   . Fibroids   . Palpitations 12/18/2012  . Nocturia 12/18/2012    Past Surgical History  Procedure Laterality Date  . Knee arthroscopy    . Cholecystectomy    . Abdominal hysterectomy      History   Social History  . Marital Status: Married    Spouse Name: Essick    Number of Children: 3  . Years of Education: N/A   Occupational History  . Homemaker     used to work for [employer]   Social History Main Topics  . Smoking status: Former Smoker -- 0.30 packs/day for 8 years    Types: Cigarettes  . Smokeless tobacco: Never Used  . Alcohol Use: Yes     Comment: Rare  . Drug Use: No  . Sexually Active: Yes -- Female partner(s)   Other Topics Concern  . 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 Breast cancer Sister     age 77  . Stroke    . Diabetes Paternal Grandmother   . Diabetes Mother     ROS: no fevers or chills, productive cough, hemoptysis, dysphasia, odynophagia, melena, hematochezia, dysuria, hematuria, rash, seizure activity, orthopnea, PND, pedal edema, claudication. Remaining systems are negative.  Physical Exam:   Blood pressure 140/82, pulse 78, weight 180 lb (81.647 kg).  General:  Well developed/well nourished in NAD Skin warm/dry Patient not depressed No peripheral clubbing Back-normal HEENT-normal/normal eyelids Neck supple/normal carotid upstroke bilaterally; no bruits; no JVD; no thyromegaly chest - CTA/ normal expansion CV - RRR/normal S1 and S2; no murmurs, rubs or gallops;   PMI nondisplaced Abdomen -NT/ND, no HSM, no mass, + bowel sounds, no bruit 2+ femoral pulses, no bruits Ext-no edema, chords, 2+ DP Neuro-grossly nonfocal  ECG normal sinus rhythm with no ST changes.

## 2013-01-10 NOTE — Assessment & Plan Note (Signed)
Continue present blood pressure medications. 

## 2013-01-11 ENCOUNTER — Telehealth: Payer: Self-pay | Admitting: Cardiology

## 2013-01-11 DIAGNOSIS — R7989 Other specified abnormal findings of blood chemistry: Secondary | ICD-10-CM

## 2013-01-11 LAB — D-DIMER, QUANTITATIVE: D-Dimer, Quant: 0.53 ug/mL-FEU — ABNORMAL HIGH (ref 0.00–0.48)

## 2013-01-11 NOTE — Telephone Encounter (Signed)
Spoke with pt, aware of elevated d-dimer. Scheduled for CTA of the chest to r/o PE at the [place] [facility] tomorrow 01-12-13 @ 9 am.

## 2013-01-11 NOTE — Telephone Encounter (Signed)
New Problem:    Patient called in returning your call. Please call back. 

## 2013-01-12 ENCOUNTER — Encounter (HOSPITAL_BASED_OUTPATIENT_CLINIC_OR_DEPARTMENT_OTHER): Payer: Self-pay

## 2013-01-12 ENCOUNTER — Ambulatory Visit (HOSPITAL_BASED_OUTPATIENT_CLINIC_OR_DEPARTMENT_OTHER)
Admission: RE | Admit: 2013-01-12 | Discharge: 2013-01-12 | Disposition: A | Discharge status: Home / Self Care | Payer: Medicare Other | Source: Ambulatory Visit | Attending: Cardiology | Admitting: Cardiology

## 2013-01-12 DIAGNOSIS — R002 Palpitations: Secondary | ICD-10-CM | POA: Diagnosis not present

## 2013-01-12 DIAGNOSIS — R791 Abnormal coagulation profile: Secondary | ICD-10-CM | POA: Diagnosis not present

## 2013-01-12 DIAGNOSIS — R7989 Other specified abnormal findings of blood chemistry: Secondary | ICD-10-CM | POA: Insufficient documentation

## 2013-01-12 MED ORDER — IOHEXOL 350 MG/ML SOLN
80.0000 mL | Freq: Once | INTRAVENOUS | Status: AC | PRN
  Administered 2013-01-12: 80 mL via INTRAVENOUS

## 2013-01-23 ENCOUNTER — Encounter: Payer: Self-pay | Admitting: Cardiology

## 2013-01-23 ENCOUNTER — Ambulatory Visit (HOSPITAL_COMMUNITY): Payer: Medicare Other | Attending: Cardiology

## 2013-01-23 DIAGNOSIS — R0609 Other forms of dyspnea: Secondary | ICD-10-CM | POA: Insufficient documentation

## 2013-01-23 DIAGNOSIS — R0989 Other specified symptoms and signs involving the circulatory and respiratory systems: Secondary | ICD-10-CM

## 2013-01-23 DIAGNOSIS — R079 Chest pain, unspecified: Secondary | ICD-10-CM | POA: Diagnosis not present

## 2013-01-23 DIAGNOSIS — R072 Precordial pain: Secondary | ICD-10-CM | POA: Diagnosis not present

## 2013-01-23 DIAGNOSIS — R002 Palpitations: Secondary | ICD-10-CM | POA: Insufficient documentation

## 2013-01-23 DIAGNOSIS — I10 Essential (primary) hypertension: Secondary | ICD-10-CM

## 2013-01-23 DIAGNOSIS — R42 Dizziness and giddiness: Secondary | ICD-10-CM | POA: Diagnosis not present

## 2013-01-23 NOTE — Progress Notes (Signed)
Echocardiogram performed.  

## 2013-01-24 ENCOUNTER — Telehealth: Payer: Self-pay | Admitting: Cardiology

## 2013-01-24 NOTE — Telephone Encounter (Signed)
Pt wants to know that since she had a stress echo yesterday, and found nothing wrong with her heart, does she needs to continue wearing the cardio net monitor. Pt was made aware that,  The  Monitor would identified if she has arrhythmias while wearing the monitor. Pt verbalized understanding.

## 2013-01-24 NOTE — Telephone Encounter (Signed)
New Problem:    Patient called in because she had an ECHO yesterday and wanted to know if she still needed to continue wearing her monitor.  Please call back.

## 2013-01-29 ENCOUNTER — Encounter (INDEPENDENT_AMBULATORY_CARE_PROVIDER_SITE_OTHER): Payer: Medicare Other

## 2013-01-29 DIAGNOSIS — R079 Chest pain, unspecified: Secondary | ICD-10-CM

## 2013-01-29 DIAGNOSIS — H53129 Transient visual loss, unspecified eye: Secondary | ICD-10-CM

## 2013-01-29 DIAGNOSIS — I6529 Occlusion and stenosis of unspecified carotid artery: Secondary | ICD-10-CM

## 2013-01-29 DIAGNOSIS — G459 Transient cerebral ischemic attack, unspecified: Secondary | ICD-10-CM

## 2013-01-29 DIAGNOSIS — I10 Essential (primary) hypertension: Secondary | ICD-10-CM

## 2013-01-29 DIAGNOSIS — R002 Palpitations: Secondary | ICD-10-CM

## 2013-02-07 ENCOUNTER — Telehealth: Payer: Self-pay | Admitting: *Deleted

## 2013-02-07 NOTE — Telephone Encounter (Signed)
Spoke with pt, aware monitor reviewed by dr crenshaw shows sinus 

## 2013-02-21 ENCOUNTER — Encounter: Payer: Self-pay | Admitting: Cardiology

## 2013-02-21 ENCOUNTER — Ambulatory Visit (INDEPENDENT_AMBULATORY_CARE_PROVIDER_SITE_OTHER): Payer: Medicare Other | Admitting: Cardiology

## 2013-02-21 VITALS — BP 140/80 | HR 75 | Wt 180.0 lb

## 2013-02-21 DIAGNOSIS — R079 Chest pain, unspecified: Secondary | ICD-10-CM

## 2013-02-21 DIAGNOSIS — E785 Hyperlipidemia, unspecified: Secondary | ICD-10-CM | POA: Diagnosis not present

## 2013-02-21 DIAGNOSIS — R002 Palpitations: Secondary | ICD-10-CM

## 2013-02-21 DIAGNOSIS — I10 Essential (primary) hypertension: Secondary | ICD-10-CM

## 2013-02-21 DIAGNOSIS — I679 Cerebrovascular disease, unspecified: Secondary | ICD-10-CM | POA: Diagnosis not present

## 2013-02-21 MED ORDER — LOSARTAN POTASSIUM 100 MG PO TABS: ORAL_TABLET | ORAL | Status: DC

## 2013-02-21 MED ORDER — SIMVASTATIN 40 MG PO TABS: ORAL_TABLET | ORAL | Status: DC

## 2013-02-21 NOTE — Assessment & Plan Note (Signed)
Symptoms have resolved.functional study negative.

## 2013-02-21 NOTE — Assessment & Plan Note (Signed)
Continue aspirin and statin. Followup carotid Dopplers April 2014.

## 2013-02-21 NOTE — Assessment & Plan Note (Signed)
Monitor showed no arrhythmia. However she did not have symptoms with a monitor in place. Further workup if recurrent symptoms in the future.

## 2013-02-21 NOTE — Progress Notes (Signed)
HPI: 67 year old female with no prior cardiac history I initially saw in March of 2014 for evaluation of chest pain and palpitations. Stress echocardiogram in April of 2014 showed normal wall motion. There was a hypertensive response. Carotid Dopplers in April of 2014 showed a 40-59% left and 0-39% right stenosis. Followup recommended in one year. Event monitor in April of 2014 showed sinus rhythm. CTA in March of 2014 showed no pulmonary embolus. TSH normal. Since I last saw her, the patient denies any dyspnea on exertion, orthopnea, PND, pedal edema, palpitations, syncope or chest pain.   Current Outpatient Prescriptions  Medication Sig Dispense Refill  . aspirin 81 MG tablet Take 81 mg by mouth daily.        . Cholecalciferol (VITAMIN D) 1000 UNITS capsule Take 1,000 Units by mouth. ocassionally      . losartan (COZAAR) 50 MG tablet TAKE 1 TABLET BY MOUTH DAILY  30 tablet  6  . metFORMIN (GLUCOPHAGE) 500 MG tablet TAKE 1 TABLET BY MOUTH TWICE DAILY WITH A MEAL  60 tablet  5  . multivitamin-iron-minerals-folic acid (CENTRUM) chewable tablet Chew 1 tablet by mouth daily.      . nitroGLYCERIN (NITROSTAT) 0.4 MG SL tablet Place 1 tablet (0.4 mg total) under the tongue every 5 (five) minutes as needed for chest pain. Repeat q 5 minutes X 3 doses. If no relief go to ER  25 tablet  1  . Probiotic Product (PROBIOTIC DAILY PO) Take by mouth as needed. Digestive Advantage Probiotic.      . simvastatin (ZOCOR) 10 MG tablet TAKE 1 TABLET BY MOUTH AT BEDTIME  30 tablet  6   No current facility-administered medications for this visit.     Past Medical History  Diagnosis Date  . History of DVT (deep vein thrombosis) 1994  . Hyperlipidemia   . Hypertension   . DJD (degenerative joint disease), lumbar   . Bouchard nodes (DJD hand)   . Allergic rhinitis   . Diabetes mellitus type II   . Anxiety   . Glaucoma(365)   . Fibroids   . Palpitations 12/18/2012  . Nocturia 12/18/2012    Past Surgical  History  Procedure Laterality Date  . Knee arthroscopy    . Cholecystectomy    . Abdominal hysterectomy      History   Social History  . Marital Status: Married    Spouse Name: Essick    Number of Children: 3  . Years of Education: N/A   Occupational History  . Homemaker     used to work for [employer]   Social History Main Topics  . Smoking status: Former Smoker -- 0.30 packs/day for 8 years    Types: Cigarettes  . Smokeless tobacco: Never Used  . Alcohol Use: Yes     Comment: Rare  . Drug Use: No  . Sexually Active: Yes -- Female partner(s)   Other Topics Concern  . 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 Remaining systems are negative.  Physical Exam: Well-developed well-nourished in no acute distress.  Skin is warm and dry.  HEENT  is normal.  Neck is supple.  Chest is clear to auscultation with normal expansion.  Cardiovascular exam is regular rate and rhythm.  Abdominal exam nontender or distended. No masses palpated. Extremities show no edema. neuro grossly intact

## 2013-02-21 NOTE — Assessment & Plan Note (Signed)
Given documented cerebrovascular disease I will increase Zocor to 40 mg daily. Check lipids and liver in 6 weeks.

## 2013-02-21 NOTE — Patient Instructions (Signed)
Your physician wants you to follow-up in: 6 MONTHS WITH DR Jens Som IN [place] You will receive a reminder letter in the mail two months in advance. If you don't receive a letter, please call our office to schedule the follow-up appointment.   INCREASE LOSARTAN TO 100 MG ONCE DAILY  Your physician recommends that you return for lab work in: ONE WEEK IN [place]  INCREASE SIMVASTATIN TO 40 MG ONCE DAILY  Your physician recommends that you return for lab work in: 6 WEEKS DO NOT EAT PRIOR TO LAB WORK=HAVE DRAWN IN [place]

## 2013-02-21 NOTE — Assessment & Plan Note (Signed)
Blood pressure has been mildly elevated. Increase Cozaar to 100 mg daily. Check potassium and renal function in one week.

## 2013-02-28 ENCOUNTER — Other Ambulatory Visit: Payer: Self-pay | Admitting: *Deleted

## 2013-02-28 ENCOUNTER — Telehealth: Payer: Self-pay | Admitting: Internal Medicine

## 2013-02-28 DIAGNOSIS — R002 Palpitations: Secondary | ICD-10-CM

## 2013-02-28 LAB — BASIC METABOLIC PANEL WITH GFR
CO2: 27 mEq/L (ref 19–32)
Calcium: 9.5 mg/dL (ref 8.4–10.5)
GFR, Est African American: >89 mL/min
Sodium: 138 mEq/L (ref 135–145)

## 2013-02-28 NOTE — Telephone Encounter (Signed)
Opened in error

## 2013-03-13 ENCOUNTER — Telehealth: Payer: Self-pay

## 2013-03-13 DIAGNOSIS — E785 Hyperlipidemia, unspecified: Secondary | ICD-10-CM

## 2013-03-13 DIAGNOSIS — I10 Essential (primary) hypertension: Secondary | ICD-10-CM

## 2013-03-13 DIAGNOSIS — E119 Type 2 diabetes mellitus without complications: Secondary | ICD-10-CM

## 2013-03-13 NOTE — Telephone Encounter (Signed)
hgba1c for dm, lipid for hyperlipidemia, tsh, cbc, renal and hepatic

## 2013-03-13 NOTE — Telephone Encounter (Signed)
Pt came in today for labs? Please advise which labs need to be ordered

## 2013-03-13 NOTE — Telephone Encounter (Signed)
For htn

## 2013-03-14 DIAGNOSIS — I10 Essential (primary) hypertension: Secondary | ICD-10-CM | POA: Diagnosis not present

## 2013-03-14 DIAGNOSIS — E119 Type 2 diabetes mellitus without complications: Secondary | ICD-10-CM | POA: Diagnosis not present

## 2013-03-14 DIAGNOSIS — E785 Hyperlipidemia, unspecified: Secondary | ICD-10-CM | POA: Diagnosis not present

## 2013-03-14 LAB — TSH: TSH: 0.931 u[IU]/mL (ref 0.350–4.500)

## 2013-03-14 LAB — HEMOGLOBIN A1C
Hgb A1c MFr Bld: 6 % — ABNORMAL HIGH (ref <5.7)
Mean Plasma Glucose: 126 mg/dL — ABNORMAL HIGH (ref <117)

## 2013-03-14 LAB — CBC
HCT: 34.2 % — ABNORMAL LOW (ref 36.0–46.0)
Hemoglobin: 12 g/dL (ref 12.0–15.0)
WBC: 4.8 10*3/uL (ref 4.0–10.5)

## 2013-03-15 LAB — HEPATIC FUNCTION PANEL
ALT: 14 U/L (ref 0–35)
Alkaline Phosphatase: 91 U/L (ref 39–117)
Indirect Bilirubin: 0.4 mg/dL (ref 0.0–0.9)
Total Protein: 6.8 g/dL (ref 6.0–8.3)

## 2013-03-15 LAB — RENAL FUNCTION PANEL
BUN: 14 mg/dL (ref 6–23)
Chloride: 105 mEq/L (ref 96–112)
Glucose, Bld: 100 mg/dL — ABNORMAL HIGH (ref 70–99)
Potassium: 4.6 mEq/L (ref 3.5–5.3)

## 2013-03-15 LAB — LIPID PANEL: HDL: 60 mg/dL (ref >39)

## 2013-03-19 ENCOUNTER — Ambulatory Visit (INDEPENDENT_AMBULATORY_CARE_PROVIDER_SITE_OTHER): Payer: Medicare Other | Admitting: Family Medicine

## 2013-03-19 ENCOUNTER — Encounter: Payer: Self-pay | Admitting: Family Medicine

## 2013-03-19 VITALS — BP 132/70 | HR 82 | Temp 98.6°F | Ht 61.0 in | Wt 179.1 lb

## 2013-03-19 DIAGNOSIS — E119 Type 2 diabetes mellitus without complications: Secondary | ICD-10-CM | POA: Diagnosis not present

## 2013-03-19 DIAGNOSIS — I10 Essential (primary) hypertension: Secondary | ICD-10-CM

## 2013-03-19 DIAGNOSIS — M25562 Pain in left knee: Secondary | ICD-10-CM

## 2013-03-19 DIAGNOSIS — E785 Hyperlipidemia, unspecified: Secondary | ICD-10-CM | POA: Diagnosis not present

## 2013-03-19 DIAGNOSIS — D649 Anemia, unspecified: Secondary | ICD-10-CM | POA: Diagnosis not present

## 2013-03-19 DIAGNOSIS — M25569 Pain in unspecified knee: Secondary | ICD-10-CM

## 2013-03-19 HISTORY — DX: Anemia, unspecified: D64.9

## 2013-03-19 NOTE — Assessment & Plan Note (Signed)
hgba1c 6.0 encouraged ongoing exercise and minimize simple carbs, continue current meds

## 2013-03-19 NOTE — Assessment & Plan Note (Addendum)
Well controlled on current meds no changes. Noted some mild slowing of the bowels with increased Cozaar, encouraged to add some Benefiber and increase fluid report worsening symptoms.

## 2013-03-19 NOTE — Assessment & Plan Note (Addendum)
Very mild encouraged increase iroin in diet

## 2013-03-19 NOTE — Patient Instructions (Addendum)
Labs prior to visit lipid, renal, cbc, tsh, hepatic, hgba1c Add Benefiber For ear pressure try nasal saline flushes and an antihistamine such as Zyrtec    Diabetes and Exercise Regular exercise is important and can help:   Control blood glucose (sugar).  Decrease blood pressure.      Control blood lipids (cholesterol, triglycerides).  Improve overall health. BENEFITS FROM EXERCISE  Improved fitness.  Improved flexibility.  Improved endurance.  Increased bone density.  Weight control.  Increased muscle strength.  Decreased body fat.  Improvement of the body's use of insulin, a hormone.  Increased insulin sensitivity.  Reduction of insulin needs.  Reduced stress and tension.  Helps you feel better. People with diabetes who add exercise to their lifestyle gain additional benefits, including:  Weight loss.  Reduced appetite.  Improvement of the body's use of blood glucose.  Decreased risk factors for heart disease:  Lowering of cholesterol and triglycerides.  Raising the level of good cholesterol (high-density lipoproteins, HDL).  Lowering blood sugar.  Decreased blood pressure. TYPE 1 DIABETES AND EXERCISE  Exercise will usually lower your blood glucose.  If blood glucose is greater than 240 mg/dl, check urine ketones. If ketones are present, do not exercise.  Location of the insulin injection sites may need to be adjusted with exercise. Avoid injecting insulin into areas of the body that will be exercised. For example, avoid injecting insulin into:  The arms when playing tennis.  The legs when jogging. For more information, discuss this with your caregiver.  Keep a record of:  Food intake.  Type and amount of exercise.  Expected peak times of insulin action.  Blood glucose levels. Do this before, during, and after exercise. Review your records with your caregiver. This will help you to develop guidelines for adjusting food intake and  insulin amounts.  TYPE 2 DIABETES AND EXERCISE  Regular physical activity can help control blood glucose.  Exercise is important because it may:  Increase the body's sensitivity to insulin.  Improve blood glucose control.  Exercise reduces the risk of heart disease. It decreases serum cholesterol and triglycerides. It also lowers blood pressure.  Those who take insulin or oral hypoglycemic agents should watch for signs of hypoglycemia. These signs include dizziness, shaking, sweating, chills, and confusion.  Body water is lost during exercise. It must be replaced. This will help to avoid loss of body fluids (dehydration) or heat stroke. Be sure to talk to your caregiver before starting an exercise program to make sure it is safe for you. Remember, any activity is better than none.  Document Released: 12/25/2003 Document Revised: 12/27/2011 Document Reviewed: 04/10/2009 [facility] Patient Information 2014 [place], [place].

## 2013-03-19 NOTE — Assessment & Plan Note (Signed)
Tolerating Simvastatin increased to 40 mg daily, avoid trans fats.

## 2013-03-19 NOTE — Assessment & Plan Note (Signed)
Worse with increased exercise, encouraged to try topical treatments like Aspercreme or Salon Pas.

## 2013-03-19 NOTE — Progress Notes (Signed)
Patient ID: Aleesa Sweigert, female   DOB: Jan 31, 1946, 67 y.o.   MRN: [id number] Akili Cuda [id number] November 20, 1945 03/19/2013      Progress Note-Follow Up  Subjective  Chief Complaint  Chief Complaint  Patient presents with  . Follow-up    3 month    HPI  Patient is a 67 year old female in today for followup. [facility] complaining of a few concerns. Has some stiffness in her knees but no swelling or erythema. Has been walking more and is consistent is worse with walking. Has some mild increase in urination and some mild decrease in her bowel movements since her last visit here. Is moving her bowels every other day and now is just once a day. No recent illness. No chest pain, palpitations, shortness of breath, GU complaints. Follows with OB/GYN for her Pap smears  Past Medical History  Diagnosis Date  . History of DVT (deep vein thrombosis) 1994  . Hyperlipidemia   . Hypertension   . DJD (degenerative joint disease), lumbar   . Bouchard nodes (DJD hand)   . Allergic rhinitis   . Diabetes mellitus type II   . Anxiety   . Glaucoma   . Fibroids   . Palpitations 12/18/2012  . Nocturia 12/18/2012    Past Surgical History  Procedure Laterality Date  . Knee arthroscopy    . Cholecystectomy    . Abdominal hysterectomy      Family History  Problem Relation Age of Onset  . Breast cancer Sister     age 27  . Stroke    . Diabetes Paternal Grandmother   . Diabetes Mother     History   Social History  . Marital Status: Married    Spouse Name: Essick    Number of Children: 3  . Years of Education: N/A   Occupational History  . Homemaker     used to work for [employer]   Social History Main Topics  . Smoking status: Former Smoker -- 0.30 packs/day for 8 years    Types: Cigarettes  . Smokeless tobacco: Never Used  . Alcohol Use: Yes     Comment: Rare  . Drug Use: No  . Sexually Active: Yes -- Female partner(s)   Other Topics Concern  . 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 aspirin 81 MG tablet Take 81 mg by mouth daily.        . Cholecalciferol (VITAMIN D) 1000 UNITS capsule Take 1,000 Units by mouth. ocassionally      . losartan (COZAAR) 100 MG tablet TAKE 1 TABLET BY MOUTH DAILY  90 tablet  4  . metFORMIN (GLUCOPHAGE) 500 MG tablet TAKE 1 TABLET BY MOUTH TWICE DAILY WITH A MEAL  60 tablet  5  . multivitamin-iron-minerals-folic acid (CENTRUM) chewable tablet Chew 1 tablet by mouth daily.      . nitroGLYCERIN (NITROSTAT) 0.4 MG SL tablet Place 1 tablet (0.4 mg total) under the tongue every 5 (five) minutes as needed for chest pain. Repeat q 5 minutes X 3 doses. If no relief go to ER  25 tablet  1  . Probiotic Product (PROBIOTIC DAILY PO)  Take by mouth as needed. Digestive Advantage Probiotic.      . simvastatin (ZOCOR) 40 MG tablet TAKE 1 TABLET BY MOUTH AT BEDTIME  90 tablet  4   No current facility-administered medications on file prior to visit.    No Known Allergies  Review of Systems  Review of Systems  Constitutional: Negative for fever and malaise/fatigue.  HENT: Negative for congestion.   Eyes: Negative for pain and discharge.  Respiratory: Negative for shortness of breath.   Cardiovascular: Negative for chest pain, palpitations and leg swelling.  Gastrointestinal: Negative for nausea, abdominal pain and diarrhea.  Genitourinary: Negative for dysuria.  Musculoskeletal: Positive for joint pain. Negative for falls.       B/l knee pain and stiffness when walking more. No redness or heat. S/p b/l TKR  Skin: Negative for rash.  Neurological: Negative for loss of consciousness and headaches.  Endo/Heme/Allergies: Negative for polydipsia.  Psychiatric/Behavioral: Negative for  depression and suicidal ideas. The patient is not nervous/anxious and does not have insomnia.     Objective  BP 132/70  Pulse 82  Temp(Src) 98.6 F (37 C) (Oral)  Ht 5\' 1"  (1.549 m)  Wt 179 lb 1.9 oz (81.248 kg)  BMI 33.86 kg/m2  SpO2 97%  Physical Exam  Physical Exam  Constitutional: She is oriented to person, place, and time and well-developed, well-nourished, and in no distress. No distress.  HENT:  Head: Normocephalic and atraumatic.  Eyes: Conjunctivae are normal.  Neck: Neck supple. No thyromegaly present.  Cardiovascular: Normal rate, regular rhythm and normal heart sounds.   No murmur heard. Pulmonary/Chest: Effort normal and breath sounds normal. She has no wheezes.  Abdominal: She exhibits no distension and no mass.  Musculoskeletal: She exhibits no edema.  Lymphadenopathy:    She has no cervical adenopathy.  Neurological: She is alert and oriented to person, place, and time.  Skin: Skin is warm and dry. No rash noted. She is not diaphoretic.  Psychiatric: Memory, affect and judgment normal.    Lab Results  Component Value Date   TSH 0.931 03/14/2013   Lab Results  Component Value Date   WBC 4.8 03/14/2013   HGB 12.0 03/14/2013   HCT 34.2* 03/14/2013   MCV 88.8 03/14/2013   PLT 258 03/14/2013   Lab Results  Component Value Date   CREATININE 0.92 03/14/2013   BUN 14 03/14/2013   NA 141 03/14/2013   K 4.6 03/14/2013   CL 105 03/14/2013   CO2 23 03/14/2013   Lab Results  Component Value Date   ALT 14 03/14/2013   AST 22 03/14/2013   ALKPHOS 91 03/14/2013   BILITOT 0.5 03/14/2013   Lab Results  Component Value Date   CHOL 146 03/14/2013   Lab Results  Component Value Date   HDL 60 03/14/2013   Lab Results  Component Value Date   LDLCALC 59 03/14/2013   Lab Results  Component Value Date   TRIG 133 03/14/2013   Lab Results  Component Value Date   CHOLHDL 2.4 03/14/2013     Assessment & Plan  HYPERTENSION Well controlled on current meds no changes.  Noted some mild slowing of the bowels with increased Cozaar, encouraged to add some Benefiber and increase fluid report worsening symptoms.  HYPERLIPIDEMIA Tolerating Simvastatin increased to 40 mg daily, avoid trans fats.  Anemia Very mild encouraged increase iroin in diet  DIABETES MELLITUS, TYPE II hgba1c 6.0 encouraged ongoing exercise and minimize simple carbs, continue current meds  Left knee  pain Worse with increased exercise, encouraged to try topical treatments like Aspercreme or Salon Pas.

## 2013-04-26 ENCOUNTER — Other Ambulatory Visit: Payer: Self-pay

## 2013-05-09 DIAGNOSIS — H40019 Open angle with borderline findings, low risk, unspecified eye: Secondary | ICD-10-CM | POA: Diagnosis not present

## 2013-05-09 DIAGNOSIS — H2510 Age-related nuclear cataract, unspecified eye: Secondary | ICD-10-CM | POA: Diagnosis not present

## 2013-05-09 DIAGNOSIS — H04129 Dry eye syndrome of unspecified lacrimal gland: Secondary | ICD-10-CM | POA: Diagnosis not present

## 2013-06-25 ENCOUNTER — Ambulatory Visit (INDEPENDENT_AMBULATORY_CARE_PROVIDER_SITE_OTHER): Payer: Medicare Other

## 2013-06-25 DIAGNOSIS — Z23 Encounter for immunization: Secondary | ICD-10-CM

## 2013-08-07 ENCOUNTER — Other Ambulatory Visit: Payer: Self-pay | Admitting: Internal Medicine

## 2013-08-08 ENCOUNTER — Other Ambulatory Visit: Payer: Self-pay | Admitting: *Deleted

## 2013-08-08 DIAGNOSIS — E785 Hyperlipidemia, unspecified: Secondary | ICD-10-CM

## 2013-08-08 DIAGNOSIS — I679 Cerebrovascular disease, unspecified: Secondary | ICD-10-CM

## 2013-08-08 DIAGNOSIS — R079 Chest pain, unspecified: Secondary | ICD-10-CM

## 2013-08-08 DIAGNOSIS — I10 Essential (primary) hypertension: Secondary | ICD-10-CM

## 2013-08-08 DIAGNOSIS — R002 Palpitations: Secondary | ICD-10-CM

## 2013-08-08 MED ORDER — LOSARTAN POTASSIUM 100 MG PO TABS: ORAL_TABLET | ORAL | Status: DC

## 2013-08-08 MED ORDER — SIMVASTATIN 40 MG PO TABS: ORAL_TABLET | ORAL | Status: DC

## 2013-08-08 MED ORDER — METFORMIN HCL 500 MG PO TABS: ORAL_TABLET | ORAL | Status: DC

## 2013-08-08 NOTE — Telephone Encounter (Signed)
Patient reports that she is leaving on a flight in 1 hr & 45 min and needs her medications [Metformin, Losartan, Simvastatin] that she requested via the pharmacy on Tues, 10.21.14 [Metformin was not requested until 8:41p]; pt informed that these will be sent immediately via escribe but in the future, she needs to call the pharmacy 3-4 days ahead, so that we do not have this situation [time crunch] reoccur; pt understood & agreed/SLS

## 2013-08-08 NOTE — Telephone Encounter (Signed)
See Phone Note RE: medication refill/SLS 10.22.14

## 2013-09-10 ENCOUNTER — Telehealth: Payer: Self-pay

## 2013-09-10 DIAGNOSIS — D649 Anemia, unspecified: Secondary | ICD-10-CM

## 2013-09-10 DIAGNOSIS — E785 Hyperlipidemia, unspecified: Secondary | ICD-10-CM | POA: Diagnosis not present

## 2013-09-10 DIAGNOSIS — I10 Essential (primary) hypertension: Secondary | ICD-10-CM

## 2013-09-10 DIAGNOSIS — E119 Type 2 diabetes mellitus without complications: Secondary | ICD-10-CM | POA: Diagnosis not present

## 2013-09-10 LAB — HEPATIC FUNCTION PANEL
ALT: 9 U/L (ref 0–35)
Albumin: 4.3 g/dL (ref 3.5–5.2)
Indirect Bilirubin: 0.5 mg/dL (ref 0.0–0.9)
Total Bilirubin: 0.6 mg/dL (ref 0.3–1.2)
Total Protein: 7.3 g/dL (ref 6.0–8.3)

## 2013-09-10 LAB — CBC
MCH: 30.1 pg (ref 26.0–34.0)
MCHC: 33.4 g/dL (ref 30.0–36.0)
MCV: 90.2 fL (ref 78.0–100.0)
Platelets: 271 10*3/uL (ref 150–400)
RBC: 4.08 MIL/uL (ref 3.87–5.11)
RDW: 14.4 % (ref 11.5–15.5)
WBC: 5.6 10*3/uL (ref 4.0–10.5)

## 2013-09-10 LAB — RENAL FUNCTION PANEL
Albumin: 4.3 g/dL (ref 3.5–5.2)
CO2: 29 mEq/L (ref 19–32)
Calcium: 9.7 mg/dL (ref 8.4–10.5)
Creat: 0.84 mg/dL (ref 0.50–1.10)
Glucose, Bld: 92 mg/dL (ref 70–99)
Phosphorus: 3.3 mg/dL (ref 2.3–4.6)

## 2013-09-10 LAB — LIPID PANEL
HDL: 55 mg/dL (ref >39)
LDL Cholesterol: 48 mg/dL (ref 0–99)
Total CHOL/HDL Ratio: 2.5 Ratio
Triglycerides: 180 mg/dL — ABNORMAL HIGH (ref <150)

## 2013-09-10 LAB — TSH: TSH: 1.778 u[IU]/mL (ref 0.350–4.500)

## 2013-09-10 LAB — HEMOGLOBIN A1C
Hgb A1c MFr Bld: 6.4 % — ABNORMAL HIGH (ref <5.7)
Mean Plasma Glucose: 137 mg/dL — ABNORMAL HIGH (ref <117)

## 2013-09-10 NOTE — Telephone Encounter (Signed)
Lab order placed.

## 2013-09-17 ENCOUNTER — Encounter: Payer: Self-pay | Admitting: Family Medicine

## 2013-09-17 ENCOUNTER — Ambulatory Visit (INDEPENDENT_AMBULATORY_CARE_PROVIDER_SITE_OTHER): Payer: Medicare Other | Admitting: Family Medicine

## 2013-09-17 ENCOUNTER — Telehealth: Payer: Self-pay | Admitting: Family Medicine

## 2013-09-17 VITALS — BP 148/80 | HR 58 | Temp 98.0°F | Ht 61.0 in | Wt 179.1 lb

## 2013-09-17 DIAGNOSIS — I10 Essential (primary) hypertension: Secondary | ICD-10-CM

## 2013-09-17 DIAGNOSIS — E785 Hyperlipidemia, unspecified: Secondary | ICD-10-CM

## 2013-09-17 DIAGNOSIS — M25562 Pain in left knee: Secondary | ICD-10-CM

## 2013-09-17 DIAGNOSIS — E119 Type 2 diabetes mellitus without complications: Secondary | ICD-10-CM

## 2013-09-17 DIAGNOSIS — M25569 Pain in unspecified knee: Secondary | ICD-10-CM | POA: Diagnosis not present

## 2013-09-17 DIAGNOSIS — D649 Anemia, unspecified: Secondary | ICD-10-CM

## 2013-09-17 NOTE — Telephone Encounter (Signed)
Lab order week of 12-12-2013 Labs prior to visit. Lipid, renal, cbc, tsh, hepatic, hgba1c

## 2013-09-17 NOTE — Assessment & Plan Note (Signed)
Mildly increased initially, improved on recheck, got very little sleep. Will monitor, patient will report concerns

## 2013-09-17 NOTE — Patient Instructions (Signed)
Aspercreme, [facility] or Salon Pas as needed for pain   Arthritis, Nonspecific Arthritis is inflammation of a joint. This usually means pain, redness, warmth or swelling are present. One or more joints may be involved. There are a number of types of arthritis. Your caregiver may not be able to tell what type of arthritis you have right away. CAUSES  The most common cause of arthritis is the wear and tear on the joint (osteoarthritis). This causes damage to the cartilage, which can break down over time. The knees, hips, back and neck are most often affected by this type of arthritis. Other types of arthritis and common causes of joint pain include:  Sprains and other injuries near the joint. Sometimes minor sprains and injuries cause pain and swelling that develop hours later.  Rheumatoid arthritis. This affects hands, feet and knees. It usually affects both sides of your body at the same time. It is often associated with chronic ailments, fever, weight loss and general weakness.  Crystal arthritis. Gout and pseudo gout can cause occasional acute severe pain, redness and swelling in the foot, ankle, or knee.  Infectious arthritis. Bacteria can get into a joint through a break in overlying skin. This can cause infection of the joint. Bacteria and viruses can also spread through the blood and affect your joints.  Drug, infectious and allergy reactions. Sometimes joints can become mildly painful and slightly swollen with these types of illnesses. SYMPTOMS   Pain is the main symptom.  Your joint or joints can also be red, swollen and warm or hot to the touch.  You may have a fever with certain types of arthritis, or even feel overall ill.  The joint with arthritis will hurt with movement. Stiffness is present with some types of arthritis. DIAGNOSIS  Your caregiver will suspect arthritis based on your description of your symptoms and on your exam. Testing may be needed to find the type of  arthritis:  Blood and sometimes urine tests.  X-ray tests and sometimes CT or MRI scans.  Removal of fluid from the joint (arthrocentesis) is done to check for bacteria, crystals or other causes. Your caregiver (or a specialist) will numb the area over the joint with a local anesthetic, and use a needle to remove joint fluid for examination. This procedure is only minimally uncomfortable.  Even with these tests, your caregiver may not be able to tell what kind of arthritis you have. Consultation with a specialist (rheumatologist) may be helpful. TREATMENT  Your caregiver will discuss with you treatment specific to your type of arthritis. If the specific type cannot be determined, then the following general recommendations may apply. Treatment of severe joint pain includes:  Rest.  Elevation.  Anti-inflammatory medication (for example, ibuprofen) may be prescribed. Avoiding activities that cause increased pain.  Only take over-the-counter or prescription medicines for pain and discomfort as recommended by your caregiver.  Cold packs over an inflamed joint may be used for 10 to 15 minutes every hour. Hot packs sometimes feel better, but do not use overnight. Do not use hot packs if you are diabetic without your caregiver's permission.  A cortisone shot into arthritic joints may help reduce pain and swelling.  Any acute arthritis that gets worse over the next 1 to 2 days needs to be looked at to be sure there is no joint infection. Long-term arthritis treatment involves modifying activities and lifestyle to reduce joint stress jarring. This can include weight loss. Also, exercise is needed to  nourish the joint cartilage and remove waste. This helps keep the muscles around the joint strong. HOME CARE INSTRUCTIONS   Do not take aspirin to relieve pain if gout is suspected. This elevates uric acid levels.  Only take over-the-counter or prescription medicines for pain, discomfort or fever as  directed by your caregiver.  Rest the joint as much as possible.  If your joint is swollen, keep it elevated.  Use crutches if the painful joint is in your leg.  Drinking plenty of fluids may help for certain types of arthritis.  Follow your caregiver's dietary instructions.  Try low-impact exercise such as:  Swimming.  Water aerobics.  Biking.  Walking.  Morning stiffness is often relieved by a warm shower.  Put your joints through regular range-of-motion. SEEK MEDICAL CARE IF:   You do not feel better in 24 hours or are getting worse.  You have side effects to medications, or are not getting better with treatment. SEEK IMMEDIATE MEDICAL CARE IF:   You have a fever.  You develop severe joint pain, swelling or redness.  Many joints are involved and become painful and swollen.  There is severe back pain and/or leg weakness.  You have loss of bowel or bladder control. Document Released: 11/11/2004 Document Revised: 12/27/2011 Document Reviewed: 11/27/2008 [facility] Patient Information 2014 [place], [place].

## 2013-09-17 NOTE — Assessment & Plan Note (Signed)
Ice and Aspercreme, rest, lateral pain, no swelling

## 2013-09-17 NOTE — Assessment & Plan Note (Signed)
Mild elevation of TG, minimize carbohydrates and increase exercise

## 2013-09-17 NOTE — Telephone Encounter (Signed)
Lab order placed.

## 2013-09-17 NOTE — Progress Notes (Signed)
Patient ID: Heather Roberts, female   DOB: [birth year], 67 y.o.   MRN: [id number] Heather Roberts [id number] Mar 13, 1946 09/17/2013      Progress Note-Follow Up  Subjective  Chief Complaint  Chief Complaint  Patient presents with  . Follow-up    6 month    HPI  Patient is a 67 yo female in today for follow up today. Doing well. Is exhausted from having house guests for the enbtire holiday weekend. No recent illness, fevers, CP/palp/SOB/GI or GU c/o. Taking meds as prescribed.  Physical Exam  Constitutional: She is oriented to person, place, and time and well-developed, well-nourished, and in no distress. No distress.  HENT:  Head: Normocephalic and atraumatic.  Eyes: Conjunctivae are normal.  Neck: Neck supple. No thyromegaly present.  Cardiovascular: Normal rate, regular rhythm and normal heart sounds.   No murmur heard. Pulmonary/Chest: Effort normal and breath sounds normal. She has no wheezes.  Abdominal: She exhibits no distension and no mass.  Musculoskeletal: She exhibits no edema.  Lymphadenopathy:    She has no cervical adenopathy.  Neurological: She is alert and oriented to person, place, and time.  Skin: Skin is warm and dry. No rash noted. She is not diaphoretic.  Psychiatric: Memory, affect and judgment normal.    Past Medical History  Diagnosis Date  . History of DVT (deep vein thrombosis) 1994  . Hyperlipidemia   . Hypertension   . DJD (degenerative joint disease), lumbar   . Bouchard nodes (DJD hand)   . Allergic rhinitis   . Diabetes mellitus type II   . Anxiety   . Glaucoma   . Fibroids   . Palpitations 12/18/2012  . Nocturia 12/18/2012  . Anemia 03/19/2013    Past Surgical History  Procedure Laterality Date  . Knee arthroscopy    . Cholecystectomy    . Abdominal hysterectomy      Family History  Problem Relation Age of Onset  . Breast cancer Sister     age 58  . Stroke    . Diabetes Paternal Grandmother   . Diabetes Mother      History   Social History  . Marital Status: Married    Spouse Name: Essick    Number of Children: 3  . Years of Education: N/A   Occupational History  . Homemaker     used to work for [employer]   Social History Main Topics  . Smoking status: Former Smoker -- 0.30 packs/day for 8 years    Types: Cigarettes  . Smokeless tobacco: Never Used  . Alcohol Use: Yes     Comment: Rare  . Drug Use: No  . Sexual Activity: Yes    Partners: Male   Other Topics Concern  . 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 aspirin 81 MG tablet Take 81 mg by mouth daily.        . Cholecalciferol (VITAMIN D) 1000 UNITS capsule Take 1,000 Units by mouth. ocassionally      . losartan (COZAAR) 100 MG tablet TAKE 1 TABLET BY MOUTH DAILY  90 tablet  0  . metFORMIN (GLUCOPHAGE) 500 MG tablet TAKE  1 TABLET BY MOUTH TWICE DAILY WITH A MEAL  180 tablet  0  . multivitamin-iron-minerals-folic acid (CENTRUM) chewable tablet Chew 1 tablet by mouth daily.      . nitroGLYCERIN (NITROSTAT) 0.4 MG SL tablet Place 1 tablet (0.4 mg total) under the tongue every 5 (five) minutes as needed for chest pain. Repeat q 5 minutes X 3 doses. If no relief go to ER  25 tablet  1  . Probiotic Product (PROBIOTIC DAILY PO) Take by mouth as needed. Digestive Advantage Probiotic.      . simvastatin (ZOCOR) 40 MG tablet TAKE 1 TABLET BY MOUTH AT BEDTIME  90 tablet  0   No current facility-administered medications on file prior to visit.    No Known Allergies  Review of Systems  Review of Systems  Constitutional: Positive for malaise/fatigue. Negative for fever.  HENT: Negative for congestion.   Eyes: Negative for discharge.  Respiratory: Negative for shortness of breath.    Cardiovascular: Negative for chest pain, palpitations and leg swelling.  Gastrointestinal: Negative for nausea, abdominal pain and diarrhea.  Genitourinary: Negative for dysuria.  Musculoskeletal: Negative for falls.  Skin: Negative for rash.  Neurological: Negative for loss of consciousness and headaches.  Endo/Heme/Allergies: Negative for polydipsia.  Psychiatric/Behavioral: Negative for depression and suicidal ideas. The patient is not nervous/anxious and does not have insomnia.     Objective  BP 172/82  Pulse 58  Temp(Src) 98 F (36.7 C) (Oral)  Ht 5\' 1"  (1.549 m)  Wt 179 lb 1.9 oz (81.248 kg)  BMI 33.86 kg/m2  SpO2 97%  Physical Exam  See above  Lab Results  Component Value Date   TSH 1.778 09/10/2013   Lab Results  Component Value Date   WBC 5.6 09/10/2013   HGB 12.3 09/10/2013   HCT 36.8 09/10/2013   MCV 90.2 09/10/2013   PLT 271 09/10/2013   Lab Results  Component Value Date   CREATININE 0.84 09/10/2013   BUN 10 09/10/2013   NA 136 09/10/2013   K 4.5 09/10/2013   CL 99 09/10/2013   CO2 29 09/10/2013   Lab Results  Component Value Date   ALT 9 09/10/2013   AST 19 09/10/2013   ALKPHOS 91 09/10/2013   BILITOT 0.6 09/10/2013   Lab Results  Component Value Date   CHOL 139 09/10/2013   Lab Results  Component Value Date   HDL 55 09/10/2013   Lab Results  Component Value Date   LDLCALC 48 09/10/2013   Lab Results  Component Value Date   TRIG 180* 09/10/2013   Lab Results  Component Value Date   CHOLHDL 2.5 09/10/2013     Assessment & Plan  DIABETES MELLITUS, TYPE II Walking daily, trying to eat less and hi fiber, low carbs. hgba1c 6.4, continue the same  HYPERLIPIDEMIA Mild elevation of TG, minimize carbohydrates and increase exercise  HYPERTENSION Mildly increased initially, improved on recheck, got very little sleep. Will monitor, patient will report concerns  Left knee pain Ice and Aspercreme, rest, lateral pain, no  swelling

## 2013-09-17 NOTE — Assessment & Plan Note (Signed)
Walking daily, trying to eat less and hi fiber, low carbs. hgba1c 6.4, continue the same

## 2013-09-17 NOTE — Progress Notes (Signed)
Pre visit review using our clinic review tool, if applicable. No additional management support is needed unless otherwise documented below in the visit note. 

## 2013-12-07 DIAGNOSIS — Z1231 Encounter for screening mammogram for malignant neoplasm of breast: Secondary | ICD-10-CM | POA: Diagnosis not present

## 2013-12-07 DIAGNOSIS — R922 Inconclusive mammogram: Secondary | ICD-10-CM | POA: Diagnosis not present

## 2013-12-07 LAB — HM MAMMOGRAPHY: HM Mammogram: NORMAL

## 2013-12-10 ENCOUNTER — Other Ambulatory Visit: Payer: Self-pay | Admitting: Family Medicine

## 2013-12-10 ENCOUNTER — Encounter: Payer: Self-pay | Admitting: Family Medicine

## 2013-12-10 DIAGNOSIS — E785 Hyperlipidemia, unspecified: Secondary | ICD-10-CM | POA: Diagnosis not present

## 2013-12-10 DIAGNOSIS — I10 Essential (primary) hypertension: Secondary | ICD-10-CM | POA: Diagnosis not present

## 2013-12-10 DIAGNOSIS — E119 Type 2 diabetes mellitus without complications: Secondary | ICD-10-CM | POA: Diagnosis not present

## 2013-12-11 LAB — HEPATIC FUNCTION PANEL
ALT: 11 U/L (ref 0–35)
AST: 19 U/L (ref 0–37)
Albumin: 4.3 g/dL (ref 3.5–5.2)
Alkaline Phosphatase: 84 U/L (ref 39–117)
Bilirubin, Direct: 0.1 mg/dL (ref 0.0–0.3)
Indirect Bilirubin: 0.4 mg/dL (ref 0.2–1.2)
Total Bilirubin: 0.5 mg/dL (ref 0.2–1.2)
Total Protein: 7 g/dL (ref 6.0–8.3)

## 2013-12-11 LAB — CBC
HCT: 37.1 % (ref 36.0–46.0)
Hemoglobin: 12.2 g/dL (ref 12.0–15.0)
MCH: 30 pg (ref 26.0–34.0)
MCHC: 32.9 g/dL (ref 30.0–36.0)
MCV: 91.4 fL (ref 78.0–100.0)
Platelets: 273 10*3/uL (ref 150–400)
RBC: 4.06 MIL/uL (ref 3.87–5.11)
RDW: 14.1 % (ref 11.5–15.5)
WBC: 5.8 10*3/uL (ref 4.0–10.5)

## 2013-12-11 LAB — LIPID PANEL
Cholesterol: 159 mg/dL (ref 0–200)
HDL: 52 mg/dL (ref >39)
LDL Cholesterol: 69 mg/dL (ref 0–99)
Total CHOL/HDL Ratio: 3.1 Ratio
Triglycerides: 188 mg/dL — ABNORMAL HIGH (ref <150)
VLDL: 38 mg/dL (ref 0–40)

## 2013-12-11 LAB — RENAL FUNCTION PANEL
Albumin: 4.3 g/dL (ref 3.5–5.2)
BUN: 12 mg/dL (ref 6–23)
CO2: 28 mEq/L (ref 19–32)
Calcium: 9.5 mg/dL (ref 8.4–10.5)
Chloride: 100 mEq/L (ref 96–112)
Creat: 0.79 mg/dL (ref 0.50–1.10)
Glucose, Bld: 137 mg/dL — ABNORMAL HIGH (ref 70–99)
Phosphorus: 3.3 mg/dL (ref 2.3–4.6)
Potassium: 4.6 mEq/L (ref 3.5–5.3)
Sodium: 136 mEq/L (ref 135–145)

## 2013-12-11 LAB — HEMOGLOBIN A1C
Hgb A1c MFr Bld: 6.4 % — ABNORMAL HIGH (ref <5.7)
Mean Plasma Glucose: 137 mg/dL — ABNORMAL HIGH (ref <117)

## 2013-12-11 LAB — TSH: TSH: 1.561 u[IU]/mL (ref 0.350–4.500)

## 2013-12-17 ENCOUNTER — Telehealth: Payer: Self-pay | Admitting: Family Medicine

## 2013-12-17 ENCOUNTER — Encounter: Payer: Self-pay | Admitting: Family Medicine

## 2013-12-17 ENCOUNTER — Ambulatory Visit (INDEPENDENT_AMBULATORY_CARE_PROVIDER_SITE_OTHER): Payer: Medicare Other | Admitting: Family Medicine

## 2013-12-17 VITALS — BP 160/88 | HR 67 | Temp 98.3°F | Ht 61.0 in | Wt 180.0 lb

## 2013-12-17 DIAGNOSIS — T7840XA Allergy, unspecified, initial encounter: Secondary | ICD-10-CM

## 2013-12-17 DIAGNOSIS — I10 Essential (primary) hypertension: Secondary | ICD-10-CM

## 2013-12-17 DIAGNOSIS — E785 Hyperlipidemia, unspecified: Secondary | ICD-10-CM

## 2013-12-17 DIAGNOSIS — E119 Type 2 diabetes mellitus without complications: Secondary | ICD-10-CM

## 2013-12-17 DIAGNOSIS — M549 Dorsalgia, unspecified: Secondary | ICD-10-CM | POA: Diagnosis not present

## 2013-12-17 DIAGNOSIS — D649 Anemia, unspecified: Secondary | ICD-10-CM

## 2013-12-17 MED ORDER — AMLODIPINE BESYLATE 5 MG PO TABS: 5.0000 mg | ORAL_TABLET | Freq: Every day | ORAL | Status: DC

## 2013-12-17 MED ORDER — FLUTICASONE PROPIONATE 50 MCG/ACT NA SUSP: 2.0000 | Freq: Every day | NASAL | Status: AC | PRN

## 2013-12-17 NOTE — Patient Instructions (Addendum)
[place] or [place] patches to back   Basic Carbohydrate Counting Basic carbohydrate counting is a way to plan meals. It is done by counting the amount of carbohydrate in foods. Foods that have carbohydrates are starches (grains, beans, starchy vegetables) and sweets. Eating carbohydrates increases blood glucose (sugar) levels. People with diabetes use carbohydrate counting to help keep their blood glucose at a normal level.  COUNTING CARBOHYDRATES IN FOODS The first step in counting carbohydrates is to learn how many carbohydrate servings you should have in every meal. A dietitian can plan this for you. After learning the amount of carbohydrates to include in your meal plan, you can start to choose the carbohydrate-containing foods you want to eat.  There are 2 ways to identify the amount of carbohydrates in the foods you eat.  Read the Nutrition Facts panel on food labels. You need 2 pieces of information from the Nutrition Facts panel to count carbohydrates this way:  Serving size.  Total carbohydrate (in grams). Decide how many servings you will be eating. If it is 1 serving, you will be eating the amount of carbohydrate listed on the panel. If you will be eating 2 servings, you will be eating double the amount of carbohydrate listed on the panel.   Learn serving sizes. A serving size of most carbohydrate-containing foods is about 15 grams (g). Listed below are single serving sizes of common carbohydrate-containing foods:  1 slice bread.   cup unsweetened, dry cereal.   cup hot cereal.   cup rice.   cup mashed potatoes.   cup pasta.  1 cup fresh fruit.   cup canned fruit.  1 cup milk (whole, 2%, or skim).   cup starchy vegetables (peas, corn, or potatoes). Counting carbohydrates this way is similar to looking on the Nutrition Facts panel. Decide how many servings you will eat first. Multiply the number of servings you eat by 15 g. For example, if you have 2 cups of  strawberries, you had 2 servings. That means you had 30 g of carbohydrate (2 servings x 15 g = 30 g). CALCULATING CARBOHYDRATES IN A MEAL Sample dinner  3 oz chicken breast.   cup brown rice.   cup corn.  1 cup fat-free milk.  1 cup strawberries with sugar-free whipped topping. Carbohydrate calculation First, identify the foods that contain carbohydrate:  Rice.  Corn.  Milk.  Strawberries. Calculate the number of servings eaten:  2 servings rice.  1 serving corn.  1 serving milk.  1 serving strawberries. Multiply the number of servings by 15 g:  2 servings rice x 15 g = 30 g.  1 serving corn x 15 g = 15 g.  1 serving milk x 15 g = 15 g.  1 serving strawberries x 15 g = 15 g. Add the amounts to find the total carbohydrates eaten: 30 g + 15 g + 15 g + 15 g = 75 g carbohydrate eaten at dinner. Document Released: 10/04/2005 Document Revised: 12/27/2011 Document Reviewed: 08/20/2011 [facility] Patient Information 2014 [place], [place].

## 2013-12-17 NOTE — Telephone Encounter (Signed)
Lab order week of 03-11-2014 Return in about 3 months (around 03/19/2014) for lipid, renal, cbc, tsh, hepatic, hgba1c prior.

## 2013-12-17 NOTE — Progress Notes (Signed)
Pre visit review using our clinic review tool, if applicable. No additional management support is needed unless otherwise documented below in the visit note. 

## 2013-12-17 NOTE — Telephone Encounter (Signed)
Lab order placed.

## 2013-12-17 NOTE — Progress Notes (Signed)
Patient ID: MONICE LUNDY, female   DOB: Jan 02, 1946, 68 y.o.   MRN: [id number] DELORUS LANGWELL [id number] 02-Jun-1946 12/17/2013      Progress Note-Follow Up  Subjective  Chief Complaint  Chief Complaint  Patient presents with  . Follow-up    3 month  . Dizziness    X 2 days    HPI  Patient is a 68 year old female in today for followup. Generally doing well but noticed her blood pressures been somewhat elevated lately. She's having some occasional thoracic back pain worse with movement but no recent falls or injury. Worse with quick movements and upon arising. No chest pain, palpitations or shortness of breath. No headaches or fevers. Taking medications as prescribed. GI or GU complaints at this time.  Past Medical History  Diagnosis Date  . History of DVT (deep vein thrombosis) 1994  . Hyperlipidemia   . Hypertension   . DJD (degenerative joint disease), lumbar   . Bouchard nodes (DJD hand)   . Allergic rhinitis   . Diabetes mellitus type II   . Anxiety   . Glaucoma   . Fibroids   . Palpitations 12/18/2012  . Nocturia 12/18/2012  . Anemia 03/19/2013    Past Surgical History  Procedure Laterality Date  . Knee arthroscopy    . Cholecystectomy    . Abdominal hysterectomy      Family History  Problem Relation Age of Onset  . Breast cancer Sister     age 52  . Stroke    . Diabetes Paternal Grandmother   . Diabetes Mother     History   Social History  . Marital Status: Married    Spouse Name: Essick    Number of Children: 3  . Years of Education: N/A   Occupational History  . Homemaker     used to work for [employer]  . Smoking status: Former Smoker -- 0.30 packs/day for 8 years    Types: Cigarettes  . Smokeless tobacco: Never Used  . Alcohol Use: Yes     Comment: Rare  . Drug Use: No  . Sexual Activity: Yes    Partners: Male   Other Topics Concern  . 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 aspirin 81 MG tablet Take 81 mg by mouth daily.        . Cholecalciferol (VITAMIN D) 1000 UNITS capsule Take 1,000 Units by mouth. ocassionally      . losartan (COZAAR) 100 MG tablet TAKE 1 TABLET BY MOUTH DAILY  90 tablet  0  . metFORMIN (GLUCOPHAGE) 500 MG tablet TAKE 1 TABLET BY MOUTH TWICE DAILY WITH A MEAL  180 tablet  0  . multivitamin-iron-minerals-folic acid (CENTRUM) chewable tablet Chew 1 tablet by mouth daily.      . nitroGLYCERIN (NITROSTAT) 0.4 MG SL tablet Place 1 tablet (0.4 mg total) under the tongue every 5 (five) minutes as needed for chest pain. Repeat q 5 minutes X 3 doses. If no relief go to ER  25 tablet  1  . Probiotic Product (PROBIOTIC DAILY PO) Take by mouth as needed. Digestive Advantage  Probiotic.      . simvastatin (ZOCOR) 40 MG tablet TAKE 1 TABLET BY MOUTH AT BEDTIME  90 tablet  0   No current facility-administered medications on file prior to visit.    No Known Allergies  Review of Systems  Review of Systems  Constitutional: Negative for fever and malaise/fatigue.  HENT: Negative for congestion and sore throat.   Eyes: Negative for discharge.  Respiratory: Negative for cough and shortness of breath.   Cardiovascular: Negative for chest pain, palpitations and leg swelling.  Gastrointestinal: Negative for nausea, abdominal pain and diarrhea.  Genitourinary: Negative for dysuria.  Musculoskeletal: Positive for back pain. Negative for falls.       Left sided thoracic back pain s/p a sneezing fit yesterday. Worse withmovement. Better when still. No associated symptoms  Skin: Negative for rash.  Neurological: Positive for dizziness. Negative for loss of consciousness and headaches.  Endo/Heme/Allergies: Negative for  polydipsia.  Psychiatric/Behavioral: Negative for depression and suicidal ideas. The patient is not nervous/anxious and does not have insomnia.     Objective  BP 160/88  Pulse 67  Temp(Src) 98.3 F (36.8 C) (Oral)  Ht 5\' 1"  (1.549 m)  Wt 180 lb (81.647 kg)  BMI 34.03 kg/m2  SpO2 96%  Physical Exam  Physical Exam  Constitutional: She is oriented to person, place, and time and well-developed, well-nourished, and in no distress. No distress.  HENT:  Head: Normocephalic and atraumatic.  Eyes: Conjunctivae are normal.  Neck: Neck supple. No thyromegaly present.  Cardiovascular: Normal rate, regular rhythm and normal heart sounds.   No murmur heard. Pulmonary/Chest: Effort normal and breath sounds normal. She has no wheezes.  Abdominal: She exhibits no distension and no mass.  Musculoskeletal: She exhibits no edema.  Lymphadenopathy:    She has no cervical adenopathy.  Neurological: She is alert and oriented to person, place, and time.  Skin: Skin is warm and dry. No rash noted. She is not diaphoretic.  Psychiatric: Memory, affect and judgment normal.    Lab Results  Component Value Date   TSH 1.561 12/10/2013   Lab Results  Component Value Date   WBC 5.8 12/10/2013   HGB 12.2 12/10/2013   HCT 37.1 12/10/2013   MCV 91.4 12/10/2013   PLT 273 12/10/2013   Lab Results  Component Value Date   CREATININE 0.79 12/10/2013   BUN 12 12/10/2013   NA 136 12/10/2013   K 4.6 12/10/2013   CL 100 12/10/2013   CO2 28 12/10/2013   Lab Results  Component Value Date   ALT 11 12/10/2013   AST 19 12/10/2013   ALKPHOS 84 12/10/2013   BILITOT 0.5 12/10/2013   Lab Results  Component Value Date   CHOL 159 12/10/2013   Lab Results  Component Value Date   HDL 52 12/10/2013   Lab Results  Component Value Date   LDLCALC 69 12/10/2013   Lab Results  Component Value Date   TRIG 188* 12/10/2013   Lab Results  Component Value Date   CHOLHDL 3.1 12/10/2013     Assessment & Plan  DIABETES  MELLITUS, TYPE II hgba1c 6.4 no change in meds. Minimize simple carbs.  HYPERTENSION Poor control, will start Amlodipine. Encouraged DASH diet  Back pain Moist heat and topical treatments. Tylenol prn

## 2013-12-17 NOTE — Assessment & Plan Note (Signed)
hgba1c 6.4 no change in meds. Minimize simple carbs.

## 2013-12-18 ENCOUNTER — Telehealth: Payer: Self-pay

## 2013-12-18 ENCOUNTER — Telehealth: Payer: Self-pay | Admitting: Family Medicine

## 2013-12-18 NOTE — Telephone Encounter (Signed)
Relevant patient education assigned to patient using Emmi. ° °

## 2013-12-21 ENCOUNTER — Telehealth: Payer: Self-pay

## 2013-12-21 NOTE — Telephone Encounter (Signed)
Pt left a message stating that she has been dizzy for 3 days since starting the Amlodipine. Pt states that pharmacist told her not to take anymore until she speaks to her MD.  Please advise?

## 2013-12-21 NOTE — Telephone Encounter (Signed)
Have her hold it for the weekend and come in for a bp check early next week

## 2013-12-21 NOTE — Telephone Encounter (Signed)
Pt informed and call transferred to [facility] to schedule nurse visit

## 2013-12-22 NOTE — Assessment & Plan Note (Signed)
Poor control, will start Amlodipine. Encouraged DASH diet

## 2013-12-22 NOTE — Assessment & Plan Note (Signed)
Moist heat and topical treatments. Tylenol prn

## 2013-12-27 ENCOUNTER — Encounter: Payer: Self-pay | Admitting: Family Medicine

## 2013-12-28 ENCOUNTER — Ambulatory Visit (INDEPENDENT_AMBULATORY_CARE_PROVIDER_SITE_OTHER): Payer: Medicare Other

## 2013-12-28 VITALS — BP 140/76 | HR 81

## 2013-12-28 DIAGNOSIS — I10 Essential (primary) hypertension: Secondary | ICD-10-CM | POA: Diagnosis not present

## 2013-12-28 NOTE — Progress Notes (Signed)
   Subjective:    Patient ID: Heather Roberts, female    DOB: [birth year], 68 y.o.   MRN: [id number]  HPI    Review of Systems     Objective:   Physical Exam        Assessment & Plan:  Patient came in today for a BP check

## 2014-01-07 ENCOUNTER — Ambulatory Visit: Payer: Medicare Other | Admitting: Family Medicine

## 2014-01-07 ENCOUNTER — Encounter: Payer: Self-pay | Admitting: Family Medicine

## 2014-01-07 ENCOUNTER — Ambulatory Visit (INDEPENDENT_AMBULATORY_CARE_PROVIDER_SITE_OTHER): Payer: Medicare Other | Admitting: Family Medicine

## 2014-01-07 VITALS — BP 150/76 | HR 69 | Ht 61.0 in | Wt 176.0 lb

## 2014-01-07 VITALS — BP 150/78 | HR 69

## 2014-01-07 VITALS — BP 150/76 | HR 63 | Ht 61.0 in | Wt 176.0 lb

## 2014-01-07 DIAGNOSIS — I10 Essential (primary) hypertension: Secondary | ICD-10-CM | POA: Diagnosis not present

## 2014-01-07 DIAGNOSIS — E119 Type 2 diabetes mellitus without complications: Secondary | ICD-10-CM

## 2014-01-07 NOTE — Progress Notes (Signed)
   Subjective:    Patient ID: Heather Roberts, female    DOB: 01/03/1946, 67 y.o.   MRN: [id number]  HPI    Review of Systems     Objective:   Physical Exam        Assessment & Plan:  Pre visit review using our clinic review tool, if applicable. No additional management support is needed unless otherwise documented below in the visit note.  

## 2014-01-07 NOTE — Progress Notes (Signed)
   Subjective:    Patient ID: Heather Roberts, female    DOB: 25-Oct-1945, 68 y.o.   MRN: [id number]  HPI    Review of Systems     Objective:   Physical Exam        Assessment & Plan:  Pre visit review using our clinic review tool, if applicable. No additional management support is needed unless otherwise documented below in the visit note.

## 2014-01-07 NOTE — Patient Instructions (Addendum)
Call if bp running above 165/95, can take an extra Amlodipine if this happens Make sure take meds prior to next visit  Hypertension As your heart beats, it forces blood through your arteries. This force is your blood pressure. If the pressure is too high, it is called hypertension (HTN) or high blood pressure. HTN is dangerous because you may have it and not know it. High blood pressure may mean that your heart has to work harder to pump blood. Your arteries may be narrow or stiff. The extra work puts you at risk for heart disease, stroke, and other problems.  Blood pressure consists of two numbers, a higher number over a lower, 110/72, for example. It is stated as "110 over 72." The ideal is below 120 for the top number (systolic) and under 80 for the bottom (diastolic). Write down your blood pressure today. You should pay close attention to your blood pressure if you have certain conditions such as:  Heart failure.  Prior heart attack.  Diabetes  Chronic kidney disease.  Prior stroke.  Multiple risk factors for heart disease. To see if you have HTN, your blood pressure should be measured while you are seated with your arm held at the level of the heart. It should be measured at least twice. A one-time elevated blood pressure reading (especially in the Emergency Department) does not mean that you need treatment. There may be conditions in which the blood pressure is different between your right and left arms. It is important to see your caregiver soon for a recheck. Most people have essential hypertension which means that there is not a specific cause. This type of high blood pressure may be lowered by changing lifestyle factors such as:  Stress.  Smoking.  Lack of exercise.  Excessive weight.  Drug/tobacco/alcohol use.  Eating less salt. Most people do not have symptoms from high blood pressure until it has caused damage to the body. Effective treatment can often prevent, delay or  reduce that damage. TREATMENT  When a cause has been identified, treatment for high blood pressure is directed at the cause. There are a large number of medications to treat HTN. These fall into several categories, and your caregiver will help you select the medicines that are best for you. Medications may have side effects. You should review side effects with your caregiver. If your blood pressure stays high after you have made lifestyle changes or started on medicines,   Your medication(s) may need to be changed.  Other problems may need to be addressed.  Be certain you understand your prescriptions, and know how and when to take your medicine.  Be sure to follow up with your caregiver within the time frame advised (usually within two weeks) to have your blood pressure rechecked and to review your medications.  If you are taking more than one medicine to lower your blood pressure, make sure you know how and at what times they should be taken. Taking two medicines at the same time can result in blood pressure that is too low. SEEK IMMEDIATE MEDICAL CARE IF:  You develop a severe headache, blurred or changing vision, or confusion.  You have unusual weakness or numbness, or a faint feeling.  You have severe chest or abdominal pain, vomiting, or breathing problems. MAKE SURE YOU:   Understand these instructions.  Will watch your condition.  Will get help right away if you are not doing well or get worse. Document Released: 10/04/2005 Document Revised: 12/27/2011 Document Reviewed:  05/24/2008 [employer] Patient Information 2014 [place].

## 2014-01-07 NOTE — Progress Notes (Signed)
   Subjective:    Patient ID: Heather Roberts, female    DOB: November 27, 1945, 68 y.o.   MRN: [id number]  HPI    Review of Systems     Objective:   Physical Exam        Assessment & Plan:  Pt came in today to get er to get her bp checked

## 2014-01-12 ENCOUNTER — Encounter: Payer: Self-pay | Admitting: Family Medicine

## 2014-01-12 NOTE — Progress Notes (Signed)
Patient ID: NEKA BISE, female   DOB: 19-Jan-1946, 68 y.o.   MRN: [id number] ALLISHA HARTER [id number] July 19, 1946 01/12/2014      Progress Note-Follow Up  Subjective  Chief Complaint  Chief Complaint  Patient presents with  . Follow-up    BP    HPI  Patient is a 68 year old female in today for routine medical care. He is in today for blood pressure check but blood pressure remained high. Denies headache, chest pain, palpitations, malaise. He is taking medications as prescribed although she does note some increased stressors lately. No recent illness or fevers.  Past Medical History  Diagnosis Date  . History of DVT (deep vein thrombosis) 1994  . Hyperlipidemia   . Hypertension   . DJD (degenerative joint disease), lumbar   . Bouchard nodes (DJD hand)   . Allergic rhinitis   . Diabetes mellitus type II   . Anxiety   . Glaucoma   . Fibroids   . Palpitations 12/18/2012  . Nocturia 12/18/2012  . Anemia 03/19/2013    Past Surgical History  Procedure Laterality Date  . Knee arthroscopy    . Cholecystectomy    . Abdominal hysterectomy      Family History  Problem Relation Age of Onset  . Breast cancer Sister     age 5  . Stroke    . Diabetes Paternal Grandmother   . Diabetes Mother     History   Social History  . Marital Status: Married    Spouse Name: Essick    Number of Children: 3  . Years of Education: N/A   Occupational History  . Homemaker     used to work for [employer]  . Smoking status: Former Smoker -- 0.30 packs/day for 8 years    Types: Cigarettes  . Smokeless tobacco: Never Used  . Alcohol Use: Yes     Comment: Rare  . Drug Use: No  . Sexual Activity: Yes    Partners: Male   Other Topics Concern  . 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 amLODipine (NORVASC) 5 MG tablet Take 1 tablet (5 mg total) by mouth daily.  30 tablet  5  . aspirin 81 MG tablet Take 81 mg by mouth daily.        . Cholecalciferol (VITAMIN D) 1000 UNITS capsule Take 1,000 Units by mouth. ocassionally      . fluticasone (FLONASE) 50 MCG/ACT nasal spray Place 2 sprays into both nostrils daily as needed for allergies or rhinitis.  16 g  6  . losartan (COZAAR) 100 MG tablet TAKE 1 TABLET BY MOUTH DAILY  90 tablet  0  . metFORMIN (GLUCOPHAGE) 500 MG tablet TAKE 1 TABLET BY MOUTH TWICE DAILY WITH A MEAL  180 tablet  0  . multivitamin-iron-minerals-folic acid (CENTRUM) chewable tablet Chew 1 tablet by mouth daily.      . nitroGLYCERIN (NITROSTAT) 0.4 MG SL tablet Place 1 tablet (0.4 mg total) under the tongue every 5 (five) minutes as needed for chest pain. Repeat q 5 minutes X 3 doses. If no relief go to  ER  25 tablet  1  . Probiotic Product (PROBIOTIC DAILY PO) Take by mouth as needed. Digestive Advantage Probiotic.      . simvastatin (ZOCOR) 40 MG tablet TAKE 1 TABLET BY MOUTH AT BEDTIME  90 tablet  0   No current facility-administered medications on file prior to visit.    No Known Allergies  Review of Systems  Review of Systems  Constitutional: Negative for fever and malaise/fatigue.  HENT: Negative for congestion.   Eyes: Negative for discharge.  Respiratory: Negative for shortness of breath.   Cardiovascular: Negative for chest pain, palpitations and leg swelling.  Gastrointestinal: Negative for nausea, abdominal pain and diarrhea.  Genitourinary: Negative for dysuria.  Musculoskeletal: Negative for falls.  Skin: Negative for rash.  Neurological: Negative for loss of consciousness and headaches.  Endo/Heme/Allergies: Negative for polydipsia.  Psychiatric/Behavioral: Negative for depression and suicidal ideas. The patient is not nervous/anxious and  does not have insomnia.     Objective  BP 150/76  Pulse 69  Ht 5\' 1"  (1.549 m)  Wt 176 lb 0.6 oz (79.851 kg)  BMI 33.28 kg/m2  SpO2 94%  Physical Exam  Physical Exam  Constitutional: She is oriented to person, place, and time and well-developed, well-nourished, and in no distress. No distress.  HENT:  Head: Normocephalic and atraumatic.  Eyes: Conjunctivae are normal.  Neck: Neck supple. No thyromegaly present.  Cardiovascular: Normal rate, regular rhythm and normal heart sounds.   No murmur heard. Pulmonary/Chest: Effort normal and breath sounds normal. She has no wheezes.  Abdominal: She exhibits no distension and no mass.  Musculoskeletal: She exhibits no edema.  Lymphadenopathy:    She has no cervical adenopathy.  Neurological: She is alert and oriented to person, place, and time.  Skin: Skin is warm and dry. No rash noted. She is not diaphoretic.  Psychiatric: Memory, affect and judgment normal.    Lab Results  Component Value Date   TSH 1.561 12/10/2013   Lab Results  Component Value Date   WBC 5.8 12/10/2013   HGB 12.2 12/10/2013   HCT 37.1 12/10/2013   MCV 91.4 12/10/2013   PLT 273 12/10/2013   Lab Results  Component Value Date   CREATININE 0.79 12/10/2013   BUN 12 12/10/2013   NA 136 12/10/2013   K 4.6 12/10/2013   CL 100 12/10/2013   CO2 28 12/10/2013   Lab Results  Component Value Date   ALT 11 12/10/2013   AST 19 12/10/2013   ALKPHOS 84 12/10/2013   BILITOT 0.5 12/10/2013   Lab Results  Component Value Date   CHOL 159 12/10/2013   Lab Results  Component Value Date   HDL 52 12/10/2013   Lab Results  Component Value Date   LDLCALC 69 12/10/2013   Lab Results  Component Value Date   TRIG 188* 12/10/2013   Lab Results  Component Value Date   CHOLHDL 3.1 12/10/2013     Assessment & Plan  HYPERTENSION Poorly controlled will monitor and encouraged DASH diet, minimize caffeine and obtain adequate sleep. Report concerning symptoms and follow up as  directed and as needed  DIABETES MELLITUS, TYPE II hgba1c 6.4, minimize simple carbs, continue current meds

## 2014-01-12 NOTE — Assessment & Plan Note (Signed)
Poorly controlled will monitor and encouraged DASH diet, minimize caffeine and obtain adequate sleep. Report concerning symptoms and follow up as directed and as needed

## 2014-01-12 NOTE — Assessment & Plan Note (Signed)
hgba1c 6.4, minimize simple carbs, continue current meds

## 2014-01-12 NOTE — Progress Notes (Signed)
Patient ID: Heather Roberts, female   DOB: 10-30-1945, 68 y.o.   MRN: [id number]

## 2014-02-04 ENCOUNTER — Ambulatory Visit (INDEPENDENT_AMBULATORY_CARE_PROVIDER_SITE_OTHER): Payer: Medicare Other | Admitting: Family Medicine

## 2014-02-04 ENCOUNTER — Encounter: Payer: Self-pay | Admitting: Family Medicine

## 2014-02-04 VITALS — BP 122/64 | HR 70 | Temp 98.0°F | Ht 61.0 in | Wt 178.0 lb

## 2014-02-04 DIAGNOSIS — E119 Type 2 diabetes mellitus without complications: Secondary | ICD-10-CM

## 2014-02-04 DIAGNOSIS — M25569 Pain in unspecified knee: Secondary | ICD-10-CM

## 2014-02-04 DIAGNOSIS — I10 Essential (primary) hypertension: Secondary | ICD-10-CM

## 2014-02-04 DIAGNOSIS — M25562 Pain in left knee: Secondary | ICD-10-CM

## 2014-02-04 DIAGNOSIS — E785 Hyperlipidemia, unspecified: Secondary | ICD-10-CM | POA: Diagnosis not present

## 2014-02-04 NOTE — Patient Instructions (Signed)
Can try topical treatments such as Salon Pas, Aspercreme, Icy Hot cream  Baker Cyst A Baker cyst is a sac-like structure that forms in the back of the knee. It is filled with the same fluid that is located in your knee. This fluid lubricates the bones and cartilage of the knee and allows them to move over each other more easily. CAUSES  When the knee becomes injured or inflamed, increased fluid forms in the knee. When this happens, the joint lining is pushed out behind the knee and forms the Baker cyst. This cyst may also be caused by inflammation from arthritic conditions and infections. SIGNS AND SYMPTOMS  A Baker cyst usually has no symptoms. When the cyst is substantially enlarged:  You may feel pressure behind the knee, stiffness in the knee, or a mass in the area behind the knee.  You may develop pain, redness, and swelling in the calf. This can suggest a blood clot and requires evaluation by your health care provider. DIAGNOSIS  A Baker cyst is most often found during an ultrasound exam. This exam may have been performed for other reasons, and the cyst was found incidentally. Sometimes an MRI is used. This picks up other problems within a joint that an ultrasound exam may not. If the Baker cyst developed immediately after an injury, X-ray exams may be used to diagnose the cyst. TREATMENT  The treatment depends on the cause of the cyst. Anti-inflammatory medicines and rest often will be prescribed. If the cyst is caused by a bacterial infection, antibiotic medicines may be prescribed.  HOME CARE INSTRUCTIONS   If the cyst was caused by an injury, for the first 24 hours, keep the injured leg elevated on 2 pillows while lying down.  For the first 24 hours while you are awake, apply ice to the injured area:  Put ice in a plastic bag.  Place a towel between your skin and the bag.  Leave the ice on for 20 minutes, 2 3 times a day.  Only take over-the-counter or prescription medicines  for pain, discomfort, or fever as directed by your health care provider.  Only take antibiotic medicine as directed. Make sure to finish it even if you start to feel better. MAKE SURE YOU:   Understand these instructions.  Will watch your condition.  Will get help right away if you are not doing well or get worse. Document Released: 10/04/2005 Document Revised: 07/25/2013 Document Reviewed: 05/16/2013 [facility] Patient Information 2014 [place].

## 2014-02-04 NOTE — Progress Notes (Signed)
Pre visit review using our clinic review tool, if applicable. No additional management support is needed unless otherwise documented below in the visit note. 

## 2014-02-04 NOTE — Progress Notes (Signed)
Patient ID: Heather Roberts, female   DOB: January 14, 1946, 68 y.o.   MRN: [id number] Heather Roberts [id number] 1946/08/03 02/04/2014      Progress Note-Follow Up  Subjective  Chief Complaint  Chief Complaint  Patient presents with  . Follow-up    4 week/ BP check    HPI  Patient is a 68 year old female in today for routine medical care. Generally doing well. Is complaining of some intermittent swelling and pain behind her left knee but it is improved somewhat today. No recent falls or injury. No recent illness. No polyuria or polydipsia. Denies CP/palp/SOB/HA/congestion/fevers/GI or GU c/o. Taking meds as prescribed  Past Medical History  Diagnosis Date  . History of DVT (deep vein thrombosis) 1994  . Hyperlipidemia   . Hypertension   . DJD (degenerative joint disease), lumbar   . Bouchard nodes (DJD hand)   . Allergic rhinitis   . Diabetes mellitus type II   . Anxiety   . Glaucoma   . Fibroids   . Palpitations 12/18/2012  . Nocturia 12/18/2012  . Anemia 03/19/2013    Past Surgical History  Procedure Laterality Date  . Knee arthroscopy    . Cholecystectomy    . Abdominal hysterectomy      Family History  Problem Relation Age of Onset  . Breast cancer Sister     age 1  . Stroke    . Diabetes Paternal Grandmother   . Diabetes Mother     History   Social History  . Marital Status: Married    Spouse Name: Essick    Number of Children: 3  . Years of Education: N/A   Occupational History  . Homemaker     used to work for [employer]  . Smoking status: Former Smoker -- 0.30 packs/day for 8 years    Types: Cigarettes  . Smokeless tobacco: Never Used  . Alcohol Use: Yes     Comment: Rare  . Drug Use: No  . Sexual Activity: Yes    Partners: Male   Other Topics Concern  . 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 amLODipine (NORVASC) 5 MG tablet Take 1 tablet (5 mg total) by mouth daily.  30 tablet  5  . aspirin 81 MG tablet Take 81 mg by mouth daily.        . Cholecalciferol (VITAMIN D) 1000 UNITS capsule Take 1,000 Units by mouth. ocassionally      . fluticasone (FLONASE) 50 MCG/ACT nasal spray Place 2 sprays into both nostrils daily as needed for allergies or rhinitis.  16 g  6  . losartan (COZAAR) 100 MG tablet TAKE 1 TABLET BY MOUTH DAILY  90 tablet  0  . metFORMIN (GLUCOPHAGE) 500 MG tablet TAKE 1 TABLET BY MOUTH TWICE DAILY WITH A MEAL  180 tablet  0  . multivitamin-iron-minerals-folic acid (CENTRUM) chewable tablet Chew 1 tablet by mouth daily.      . nitroGLYCERIN (NITROSTAT) 0.4 MG SL tablet Place 1 tablet (0.4 mg total) under the tongue every 5 (five) minutes as needed for chest pain. Repeat q 5  minutes X 3 doses. If no relief go to ER  25 tablet  1  . Probiotic Product (PROBIOTIC DAILY PO) Take by mouth as needed. Digestive Advantage Probiotic.      . simvastatin (ZOCOR) 40 MG tablet TAKE 1 TABLET BY MOUTH AT BEDTIME  90 tablet  0   No current facility-administered medications on file prior to visit.    No Known Allergies  Review of Systems  Review of Systems  Constitutional: Negative for fever and malaise/fatigue.  HENT: Negative for congestion.   Eyes: Negative for discharge.  Respiratory: Negative for shortness of breath.   Cardiovascular: Negative for chest pain, palpitations and leg swelling.  Gastrointestinal: Negative for nausea, abdominal pain and diarrhea.  Genitourinary: Negative for dysuria.  Musculoskeletal: Positive for joint pain. Negative for falls.       B/l knee pain, worse after increased use. Did note some swelling behind left knee but it is resolved now. No redness or warmth  Skin: Negative for rash.  Neurological:  Negative for loss of consciousness and headaches.  Endo/Heme/Allergies: Negative for polydipsia.  Psychiatric/Behavioral: Negative for depression and suicidal ideas. The patient is not nervous/anxious and does not have insomnia.     Objective  BP 122/64  Pulse 70  Temp(Src) 98 F (36.7 C) (Oral)  Ht 5\' 1"  (1.549 m)  Wt 178 lb 0.6 oz (80.758 kg)  BMI 33.66 kg/m2  SpO2 94%  Physical Exam  Physical Exam  Constitutional: She is oriented to person, place, and time and well-developed, well-nourished, and in no distress. No distress.  HENT:  Head: Normocephalic and atraumatic.  Eyes: Conjunctivae are normal.  Neck: Neck supple. No thyromegaly present.  Cardiovascular: Normal rate, regular rhythm and normal heart sounds.   No murmur heard. Pulmonary/Chest: Effort normal and breath sounds normal. She has no wheezes.  Abdominal: She exhibits no distension and no mass.  Musculoskeletal: She exhibits no edema.  Lymphadenopathy:    She has no cervical adenopathy.  Neurological: She is alert and oriented to person, place, and time.  Skin: Skin is warm and dry. No rash noted. She is not diaphoretic.  Psychiatric: Memory, affect and judgment normal.    Lab Results  Component Value Date   TSH 1.561 12/10/2013   Lab Results  Component Value Date   WBC 5.8 12/10/2013   HGB 12.2 12/10/2013   HCT 37.1 12/10/2013   MCV 91.4 12/10/2013   PLT 273 12/10/2013   Lab Results  Component Value Date   CREATININE 0.79 12/10/2013   BUN 12 12/10/2013   NA 136 12/10/2013   K 4.6 12/10/2013   CL 100 12/10/2013   CO2 28 12/10/2013   Lab Results  Component Value Date   ALT 11 12/10/2013   AST 19 12/10/2013   ALKPHOS 84 12/10/2013   BILITOT 0.5 12/10/2013   Lab Results  Component Value Date   CHOL 159 12/10/2013   Lab Results  Component Value Date   HDL 52 12/10/2013   Lab Results  Component Value Date   LDLCALC 69 12/10/2013   Lab Results  Component Value Date   TRIG 188* 12/10/2013   Lab  Results  Component Value Date   CHOLHDL 3.1 12/10/2013     Assessment & Plan  HYPERLIPIDEMIA Tolerating statin, encouraged heart healthy diet, avoid trans fats, minimize simple carbs and saturated fats. Increase exercise as tolerated  DIABETES MELLITUS, TYPE II hgba1c acceptable, minimize simple carbs. Increase exercise as tolerated. Continue current meds  Left knee pain Intermittent  swelling behind left knee c/w baker's cyst. May benefit from referral if pain worsens  HYPERTENSION Well controlled, no changes to meds. Encouraged heart healthy diet such as the DASH diet and exercise as tolerated.

## 2014-02-06 NOTE — Assessment & Plan Note (Signed)
Well controlled, no changes to meds. Encouraged heart healthy diet such as the DASH diet and exercise as tolerated.  °

## 2014-02-06 NOTE — Assessment & Plan Note (Signed)
hgba1c acceptable, minimize simple carbs. Increase exercise as tolerated. Continue current meds 

## 2014-02-06 NOTE — Assessment & Plan Note (Signed)
Intermittent swelling behind left knee c/w baker's cyst. May benefit from referral if pain worsens

## 2014-02-06 NOTE — Assessment & Plan Note (Signed)
Tolerating statin, encouraged heart healthy diet, avoid trans fats, minimize simple carbs and saturated fats. Increase exercise as tolerated 

## 2014-02-23 ENCOUNTER — Other Ambulatory Visit: Payer: Self-pay | Admitting: Internal Medicine

## 2014-02-25 ENCOUNTER — Other Ambulatory Visit: Payer: Self-pay | Admitting: Family Medicine

## 2014-02-25 NOTE — Telephone Encounter (Signed)
Rx request to pharmacy/SL[facility]  

## 2014-03-04 ENCOUNTER — Other Ambulatory Visit (HOSPITAL_COMMUNITY): Payer: Self-pay | Admitting: Cardiology

## 2014-03-04 DIAGNOSIS — I6529 Occlusion and stenosis of unspecified carotid artery: Secondary | ICD-10-CM

## 2014-03-13 ENCOUNTER — Ambulatory Visit (HOSPITAL_COMMUNITY): Payer: Medicare Other | Attending: Cardiovascular Disease | Admitting: Cardiology

## 2014-03-13 DIAGNOSIS — I6529 Occlusion and stenosis of unspecified carotid artery: Secondary | ICD-10-CM | POA: Diagnosis not present

## 2014-03-13 NOTE — Progress Notes (Signed)
Carotid duplex complete 

## 2014-03-18 ENCOUNTER — Encounter: Payer: Self-pay | Admitting: Family Medicine

## 2014-03-18 ENCOUNTER — Ambulatory Visit (INDEPENDENT_AMBULATORY_CARE_PROVIDER_SITE_OTHER): Payer: Medicare Other | Admitting: Family Medicine

## 2014-03-18 ENCOUNTER — Telehealth: Payer: Self-pay | Admitting: Family Medicine

## 2014-03-18 VITALS — BP 124/84 | HR 67 | Temp 98.7°F | Ht 61.0 in | Wt 177.1 lb

## 2014-03-18 DIAGNOSIS — D649 Anemia, unspecified: Secondary | ICD-10-CM | POA: Diagnosis not present

## 2014-03-18 DIAGNOSIS — R002 Palpitations: Secondary | ICD-10-CM

## 2014-03-18 DIAGNOSIS — I739 Peripheral vascular disease, unspecified: Secondary | ICD-10-CM

## 2014-03-18 DIAGNOSIS — I679 Cerebrovascular disease, unspecified: Secondary | ICD-10-CM | POA: Diagnosis not present

## 2014-03-18 DIAGNOSIS — E785 Hyperlipidemia, unspecified: Secondary | ICD-10-CM

## 2014-03-18 DIAGNOSIS — R079 Chest pain, unspecified: Secondary | ICD-10-CM | POA: Diagnosis not present

## 2014-03-18 DIAGNOSIS — E119 Type 2 diabetes mellitus without complications: Secondary | ICD-10-CM

## 2014-03-18 DIAGNOSIS — I6529 Occlusion and stenosis of unspecified carotid artery: Secondary | ICD-10-CM | POA: Diagnosis not present

## 2014-03-18 DIAGNOSIS — I10 Essential (primary) hypertension: Secondary | ICD-10-CM | POA: Diagnosis not present

## 2014-03-18 DIAGNOSIS — I779 Disorder of arteries and arterioles, unspecified: Secondary | ICD-10-CM

## 2014-03-18 LAB — HEPATIC FUNCTION PANEL
ALK PHOS: 101 U/L (ref 39–117)
ALT: 16 U/L (ref 0–35)
AST: 21 U/L (ref 0–37)
Albumin: 4.5 g/dL (ref 3.5–5.2)
BILIRUBIN INDIRECT: 0.4 mg/dL (ref 0.2–1.2)
Bilirubin, Direct: 0.1 mg/dL (ref 0.0–0.3)
Total Bilirubin: 0.5 mg/dL (ref 0.2–1.2)
Total Protein: 7.7 g/dL (ref 6.0–8.3)

## 2014-03-18 LAB — CBC
HEMATOCRIT: 35.9 % — AB (ref 36.0–46.0)
HEMOGLOBIN: 12.3 g/dL (ref 12.0–15.0)
MCH: 30.7 pg (ref 26.0–34.0)
MCHC: 34.3 g/dL (ref 30.0–36.0)
MCV: 89.5 fL (ref 78.0–100.0)
Platelets: 263 10*3/uL (ref 150–400)
RBC: 4.01 MIL/uL (ref 3.87–5.11)
RDW: 14.3 % (ref 11.5–15.5)
WBC: 6.8 10*3/uL (ref 4.0–10.5)

## 2014-03-18 LAB — LIPID PANEL
CHOL/HDL RATIO: 4 ratio
CHOLESTEROL: 230 mg/dL — AB (ref 0–200)
HDL: 58 mg/dL (ref >39)
LDL Cholesterol: 126 mg/dL — ABNORMAL HIGH (ref 0–99)
Triglycerides: 228 mg/dL — ABNORMAL HIGH (ref <150)
VLDL: 46 mg/dL — ABNORMAL HIGH (ref 0–40)

## 2014-03-18 LAB — HEMOGLOBIN A1C
Hgb A1c MFr Bld: 6.3 % — ABNORMAL HIGH (ref <5.7)
Mean Plasma Glucose: 134 mg/dL — ABNORMAL HIGH (ref <117)

## 2014-03-18 LAB — RENAL FUNCTION PANEL
ALBUMIN: 4.5 g/dL (ref 3.5–5.2)
BUN: 13 mg/dL (ref 6–23)
CALCIUM: 10.1 mg/dL (ref 8.4–10.5)
CO2: 25 mEq/L (ref 19–32)
Chloride: 100 mEq/L (ref 96–112)
Creat: 0.82 mg/dL (ref 0.50–1.10)
Glucose, Bld: 93 mg/dL (ref 70–99)
POTASSIUM: 4.4 meq/L (ref 3.5–5.3)
Phosphorus: 3.4 mg/dL (ref 2.3–4.6)
SODIUM: 137 meq/L (ref 135–145)

## 2014-03-18 MED ORDER — LOSARTAN POTASSIUM 100 MG PO TABS: 50.0000 mg | ORAL_TABLET | Freq: Every day | ORAL | Status: DC

## 2014-03-18 MED ORDER — AMLODIPINE BESYLATE 5 MG PO TABS: 2.5000 mg | ORAL_TABLET | Freq: Every day | ORAL | Status: DC

## 2014-03-18 NOTE — Telephone Encounter (Signed)
Relevant patient education assigned to patient using Emmi. ° °

## 2014-03-18 NOTE — Patient Instructions (Signed)
Restart Losartan 100 mg 1/2 tab po daily and drop the Amlodipine 5 mg 1/2 tab po daily  Go down to lab and get labs run today.   Basic Carbohydrate Counting Basic carbohydrate counting is a way to plan meals. It is done by counting the amount of carbohydrate in foods. Foods that have carbohydrates are starches (grains, beans, starchy vegetables) and sweets. Eating carbohydrates increases blood glucose (sugar) levels. People with diabetes use carbohydrate counting to help keep their blood glucose at a normal level.  COUNTING CARBOHYDRATES IN FOODS The first step in counting carbohydrates is to learn how many carbohydrate servings you should have in every meal. A dietitian can plan this for you. After learning the amount of carbohydrates to include in your meal plan, you can start to choose the carbohydrate-containing foods you want to eat.  There are 2 ways to identify the amount of carbohydrates in the foods you eat.  Read the Nutrition Facts panel on food labels. You need 2 pieces of information from the Nutrition Facts panel to count carbohydrates this way:  Serving size.  Total carbohydrate (in grams). Decide how many servings you will be eating. If it is 1 serving, you will be eating the amount of carbohydrate listed on the panel. If you will be eating 2 servings, you will be eating double the amount of carbohydrate listed on the panel.   Learn serving sizes. A serving size of most carbohydrate-containing foods is about 15 grams (g). Listed below are single serving sizes of common carbohydrate-containing foods:  1 slice bread.   cup unsweetened, dry cereal.   cup hot cereal.   cup rice.   cup mashed potatoes.   cup pasta.  1 cup fresh fruit.   cup canned fruit.  1 cup milk (whole, 2%, or skim).   cup starchy vegetables (peas, corn, or potatoes). Counting carbohydrates this way is similar to looking on the Nutrition Facts panel. Decide how many servings you will eat  first. Multiply the number of servings you eat by 15 g. For example, if you have 2 cups of strawberries, you had 2 servings. That means you had 30 g of carbohydrate (2 servings x 15 g = 30 g). CALCULATING CARBOHYDRATES IN A MEAL Sample dinner  3 oz chicken breast.   cup brown rice.   cup corn.  1 cup fat-free milk.  1 cup strawberries with sugar-free whipped topping. Carbohydrate calculation First, identify the foods that contain carbohydrate:  Rice.  Corn.  Milk.  Strawberries. Calculate the number of servings eaten:  2 servings rice.  1 serving corn.  1 serving milk.  1 serving strawberries. Multiply the number of servings by 15 g:  2 servings rice x 15 g = 30 g.  1 serving corn x 15 g = 15 g.  1 serving milk x 15 g = 15 g.  1 serving strawberries x 15 g = 15 g. Add the amounts to find the total carbohydrates eaten: 30 g + 15 g + 15 g + 15 g = 75 g carbohydrate eaten at dinner. Document Released: 10/04/2005 Document Revised: 12/27/2011 Document Reviewed: 08/20/2011 [facility] Patient Information 2014 [place], [place].

## 2014-03-18 NOTE — Progress Notes (Signed)
Pre visit review using our clinic review tool, if applicable. No additional management support is needed unless otherwise documented below in the visit note. 

## 2014-03-19 LAB — TSH: TSH: 1.078 u[IU]/mL (ref 0.350–4.500)

## 2014-03-20 ENCOUNTER — Other Ambulatory Visit: Payer: Self-pay | Admitting: Family Medicine

## 2014-03-20 ENCOUNTER — Telehealth: Payer: Self-pay | Admitting: *Deleted

## 2014-03-20 MED ORDER — ATORVASTATIN CALCIUM 10 MG PO TABS: 10.0000 mg | ORAL_TABLET | Freq: Every day | ORAL | Status: DC

## 2014-03-20 NOTE — Telephone Encounter (Signed)
90 day supply sent to pharmacy, notified pt and she states she picked up 30 day supply today. Advised her to speak with pharmacy before next refill if she wants to do 90 days supply going forward.

## 2014-03-20 NOTE — Telephone Encounter (Signed)
Pt is requesting to speak with nurse again, states pharmacy will not fill meds unless ok'd by MD

## 2014-03-20 NOTE — Telephone Encounter (Signed)
Notified pt of below instructions. She will stop simvastatin. Rx of atorvastatin sent to pharmacy.  Labs look good except for cholesterol, recommend we start Atorvastatin such as Atorvastatin 10 mg po daily disp 30 with 3 rf if no allergies

## 2014-03-20 NOTE — Telephone Encounter (Signed)
Pt is requesting to speak back with nurse, pharmacy will not fill meds until ok'd from MD

## 2014-03-24 ENCOUNTER — Encounter: Payer: Self-pay | Admitting: Family Medicine

## 2014-03-24 DIAGNOSIS — I779 Disorder of arteries and arterioles, unspecified: Secondary | ICD-10-CM

## 2014-03-24 DIAGNOSIS — I739 Peripheral vascular disease, unspecified: Secondary | ICD-10-CM

## 2014-03-24 HISTORY — DX: Disorder of arteries and arterioles, unspecified: I77.9

## 2014-03-24 NOTE — Assessment & Plan Note (Signed)
Encouraged heart healthy diet, increase exercise, avoid trans fats, consider a krill oil cap daily. Started on Lipitor

## 2014-03-24 NOTE — Assessment & Plan Note (Signed)
Stable per ultrasound, repeat in 2 years. Minimize risk factors

## 2014-03-24 NOTE — Assessment & Plan Note (Signed)
Mild, recurrent. Increase leafy greens, consider increased lean red meat and using cast iron cookware. Continue to monitor, report any concerns 

## 2014-03-24 NOTE — Progress Notes (Signed)
Patient ID: Heather Roberts, female   DOB: 1946/07/15, 68 y.o.   MRN: [id number] Heather Roberts [id number] 08/27/1946 03/24/2014      Progress Note-Follow Up  Subjective  Chief Complaint  Chief Complaint  Patient presents with  . Follow-up    3 month    HPI  Patient is a 68 year old famale in today for routine medical care. Chief routine followup. She feels well. Recently had carotid Dopplers which were stable and should be repeated in 2 years. Has not been taking with certain for no particular reason other than did not realize she should continue. No acute illness or complaints. Denies CP/palp/SOB/HA/congestion/fevers/GI or GU c/o. Taking meds as prescribed  Past Medical History  Diagnosis Date  . History of DVT (deep vein thrombosis) 1994  . Hyperlipidemia   . Hypertension   . DJD (degenerative joint disease), lumbar   . Bouchard nodes (DJD hand)   . Allergic rhinitis   . Diabetes mellitus type II   . Anxiety   . Glaucoma   . Fibroids   . Palpitations 12/18/2012  . Nocturia 12/18/2012  . Anemia 03/19/2013  . Carotid artery disease 03/24/2014    Past Surgical History  Procedure Laterality Date  . Knee arthroscopy    . Cholecystectomy    . Abdominal hysterectomy      Family History  Problem Relation Age of Onset  . Breast cancer Sister     age 4  . Stroke    . Diabetes Paternal Grandmother   . Diabetes Mother     History   Social History  . Marital Status: Married    Spouse Name: Essick    Number of Children: 3  . Years of Education: N/A   Occupational History  . Homemaker     used to work for [employer]  . Smoking status: Former Smoker -- 0.30 packs/day for 8 years    Types: Cigarettes  . Smokeless tobacco: Never Used  . Alcohol Use: Yes     Comment: Rare  . Drug Use: No  . Sexual Activity: Yes    Partners: Male   Other Topics Concern  . 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 aspirin 81 MG tablet Take 81 mg by mouth daily.        . Cholecalciferol (VITAMIN D) 1000 UNITS capsule Take 1,000 Units by mouth. ocassionally      . fluticasone (FLONASE) 50 MCG/ACT nasal spray Place 2 sprays into both nostrils daily as needed for allergies or rhinitis.  16 g  6  . metFORMIN (GLUCOPHAGE) 500 MG tablet TAKE 1 TABLET BY MOUTH TWICE DAILY WITH A MEAL  180 tablet  0  . multivitamin-iron-minerals-folic acid (CENTRUM) chewable tablet Chew 1 tablet by mouth daily.      . Probiotic Product (PROBIOTIC DAILY PO) Take by mouth as needed. Digestive Advantage Probiotic.      . nitroGLYCERIN (NITROSTAT) 0.4 MG SL tablet Place 1 tablet (0.4 mg total) under the tongue every 5 (five) minutes as needed for chest pain. Repeat q 5 minutes X 3 doses. If no  relief go to ER  25 tablet  1   No current facility-administered medications on file prior to visit.    No Known Allergies  Review of Systems  Review of Systems  Constitutional: Negative for fever and malaise/fatigue.  HENT: Negative for congestion.   Eyes: Negative for discharge.  Respiratory: Negative for shortness of breath.   Cardiovascular: Negative for chest pain, palpitations and leg swelling.  Gastrointestinal: Negative for nausea, abdominal pain and diarrhea.  Genitourinary: Negative for dysuria.  Musculoskeletal: Negative for falls.  Skin: Negative for rash.  Neurological: Negative for loss of consciousness and headaches.  Endo/Heme/Allergies: Negative for polydipsia.  Psychiatric/Behavioral: Negative for depression and suicidal ideas. The patient is not nervous/anxious and does not have insomnia.     Objective  BP 124/84  Pulse 67  Temp(Src) 98.7 F (37.1 C) (Oral)  Ht 5\' 1"  (1.549 m)   Wt 177 lb 1.9 oz (80.341 kg)  BMI 33.48 kg/m2  SpO2 99%  Physical Exam  Physical Exam  Constitutional: She is oriented to person, place, and time and well-developed, well-nourished, and in no distress. No distress.  HENT:  Head: Normocephalic and atraumatic.  Eyes: Conjunctivae are normal.  Neck: Neck supple. No thyromegaly present.  Cardiovascular: Normal rate, regular rhythm and normal heart sounds.   No murmur heard. Pulmonary/Chest: Effort normal and breath sounds normal. She has no wheezes.  Abdominal: She exhibits no distension and no mass.  Musculoskeletal: She exhibits no edema.  Lymphadenopathy:    She has no cervical adenopathy.  Neurological: She is alert and oriented to person, place, and time.  Skin: Skin is warm and dry. No rash noted. She is not diaphoretic.  Psychiatric: Memory, affect and judgment normal.    Lab Results  Component Value Date   TSH 1.078 03/18/2014   Lab Results  Component Value Date   WBC 6.8 03/18/2014   HGB 12.3 03/18/2014   HCT 35.9* 03/18/2014   MCV 89.5 03/18/2014   PLT 263 03/18/2014   Lab Results  Component Value Date   CREATININE 0.82 03/18/2014   BUN 13 03/18/2014   NA 137 03/18/2014   K 4.4 03/18/2014   CL 100 03/18/2014   CO2 25 03/18/2014   Lab Results  Component Value Date   ALT 16 03/18/2014   AST 21 03/18/2014   ALKPHOS 101 03/18/2014   BILITOT 0.5 03/18/2014   Lab Results  Component Value Date   CHOL 230* 03/18/2014   Lab Results  Component Value Date   HDL 58 03/18/2014   Lab Results  Component Value Date   LDLCALC 126* 03/18/2014   Lab Results  Component Value Date   TRIG 228* 03/18/2014   Lab Results  Component Value Date   CHOLHDL 4.0 03/18/2014     Assessment & Plan  HYPERTENSION Well controlled, no changes to meds. Encouraged heart healthy diet such as the DASH diet and exercise as tolerated.   DIABETES MELLITUS, TYPE II hgba1c acceptable, minimize simple carbs. Increase exercise as tolerated. Continue current  meds  HYPERLIPIDEMIA Encouraged heart healthy diet, increase exercise, avoid trans fats, consider a krill oil cap daily. Started on Lipitor  Anemia Mild, recurrent. Increase leafy greens, consider increased lean red meat and using cast iron cookware. Continue to monitor, report any concerns  Carotid artery disease Stable per ultrasound, repeat in 2 years. Minimize risk factors

## 2014-03-24 NOTE — Assessment & Plan Note (Signed)
Well controlled, no changes to meds. Encouraged heart healthy diet such as the DASH diet and exercise as tolerated.  °

## 2014-03-24 NOTE — Assessment & Plan Note (Signed)
hgba1c acceptable, minimize simple carbs. Increase exercise as tolerated. Continue current meds 

## 2014-05-13 DIAGNOSIS — H40019 Open angle with borderline findings, low risk, unspecified eye: Secondary | ICD-10-CM | POA: Diagnosis not present

## 2014-05-13 DIAGNOSIS — H04129 Dry eye syndrome of unspecified lacrimal gland: Secondary | ICD-10-CM | POA: Diagnosis not present

## 2014-05-13 DIAGNOSIS — H2510 Age-related nuclear cataract, unspecified eye: Secondary | ICD-10-CM | POA: Diagnosis not present

## 2014-05-20 ENCOUNTER — Other Ambulatory Visit: Payer: Self-pay | Admitting: Cardiology

## 2014-06-03 ENCOUNTER — Telehealth: Payer: Self-pay | Admitting: Family Medicine

## 2014-06-03 NOTE — Telephone Encounter (Signed)
Patient left message stating she is scheduled to have eye surgery and wants to know if Dr. Charlett Blake wants to see her prior

## 2014-06-03 NOTE — Telephone Encounter (Signed)
Please inform patient of mds message below

## 2014-06-03 NOTE — Telephone Encounter (Signed)
Depends on what eye surgery. Her eye doctor should be able to tell her if they want or recommend a Pre op clearance, often it depends on how extensive the surgery is. Her labs and medical conditions were stable when we saw her in June so no obvious red flags

## 2014-06-03 NOTE — Telephone Encounter (Signed)
Please advise 

## 2014-06-04 NOTE — Telephone Encounter (Signed)
Informed patient of this. She states that she has appointment with her eye doctor tomorrow morning and will ask him what is needed. She states that she will be having cataract surgery on her right eye.

## 2014-06-05 DIAGNOSIS — H43399 Other vitreous opacities, unspecified eye: Secondary | ICD-10-CM | POA: Diagnosis not present

## 2014-06-05 DIAGNOSIS — H2510 Age-related nuclear cataract, unspecified eye: Secondary | ICD-10-CM | POA: Diagnosis not present

## 2014-06-05 DIAGNOSIS — H40019 Open angle with borderline findings, low risk, unspecified eye: Secondary | ICD-10-CM | POA: Diagnosis not present

## 2014-06-13 ENCOUNTER — Other Ambulatory Visit: Payer: Self-pay | Admitting: Family Medicine

## 2014-06-21 ENCOUNTER — Other Ambulatory Visit: Payer: Self-pay | Admitting: Cardiology

## 2014-06-26 ENCOUNTER — Other Ambulatory Visit: Payer: Self-pay

## 2014-07-22 ENCOUNTER — Ambulatory Visit (INDEPENDENT_AMBULATORY_CARE_PROVIDER_SITE_OTHER): Payer: Medicare Other | Admitting: Family Medicine

## 2014-07-22 ENCOUNTER — Encounter: Payer: Self-pay | Admitting: Family Medicine

## 2014-07-22 VITALS — BP 157/61 | HR 64 | Temp 98.3°F | Ht 61.0 in | Wt 180.6 lb

## 2014-07-22 DIAGNOSIS — E669 Obesity, unspecified: Secondary | ICD-10-CM | POA: Diagnosis not present

## 2014-07-22 DIAGNOSIS — Z23 Encounter for immunization: Secondary | ICD-10-CM | POA: Diagnosis not present

## 2014-07-22 DIAGNOSIS — I10 Essential (primary) hypertension: Secondary | ICD-10-CM | POA: Diagnosis not present

## 2014-07-22 DIAGNOSIS — R002 Palpitations: Secondary | ICD-10-CM

## 2014-07-22 DIAGNOSIS — E119 Type 2 diabetes mellitus without complications: Secondary | ICD-10-CM | POA: Diagnosis not present

## 2014-07-22 DIAGNOSIS — E1169 Type 2 diabetes mellitus with other specified complication: Secondary | ICD-10-CM

## 2014-07-22 DIAGNOSIS — E785 Hyperlipidemia, unspecified: Secondary | ICD-10-CM

## 2014-07-22 DIAGNOSIS — I6529 Occlusion and stenosis of unspecified carotid artery: Secondary | ICD-10-CM | POA: Diagnosis not present

## 2014-07-22 LAB — LIPID PANEL
CHOLESTEROL: 145 mg/dL (ref 0–200)
HDL: 57.4 mg/dL (ref >39.00)
LDL Cholesterol: 64 mg/dL (ref 0–99)
NonHDL: 87.6
Total CHOL/HDL Ratio: 3
Triglycerides: 116 mg/dL (ref 0.0–149.0)
VLDL: 23.2 mg/dL (ref 0.0–40.0)

## 2014-07-22 LAB — CBC
HCT: 37.9 % (ref 36.0–46.0)
HEMOGLOBIN: 12.4 g/dL (ref 12.0–15.0)
MCHC: 32.8 g/dL (ref 30.0–36.0)
MCV: 93.2 fl (ref 78.0–100.0)
PLATELETS: 278 10*3/uL (ref 150.0–400.0)
RBC: 4.07 Mil/uL (ref 3.87–5.11)
RDW: 14.5 % (ref 11.5–15.5)
WBC: 5.5 10*3/uL (ref 4.0–10.5)

## 2014-07-22 LAB — TSH: TSH: 0.92 u[IU]/mL (ref 0.35–4.50)

## 2014-07-22 LAB — RENAL FUNCTION PANEL
ALBUMIN: 4.1 g/dL (ref 3.5–5.2)
BUN: 13 mg/dL (ref 6–23)
CALCIUM: 9.8 mg/dL (ref 8.4–10.5)
CHLORIDE: 102 meq/L (ref 96–112)
CO2: 26 mEq/L (ref 19–32)
Creatinine, Ser: 0.8 mg/dL (ref 0.4–1.2)
GFR: 87.9 mL/min (ref >60.00)
GLUCOSE: 89 mg/dL (ref 70–99)
PHOSPHORUS: 3.3 mg/dL (ref 2.3–4.6)
POTASSIUM: 3.8 meq/L (ref 3.5–5.1)
Sodium: 138 mEq/L (ref 135–145)

## 2014-07-22 LAB — HEPATIC FUNCTION PANEL
ALT: 14 U/L (ref 0–35)
AST: 23 U/L (ref 0–37)
Albumin: 4.1 g/dL (ref 3.5–5.2)
Alkaline Phosphatase: 93 U/L (ref 39–117)
BILIRUBIN DIRECT: 0.1 mg/dL (ref 0.0–0.3)
Total Bilirubin: 0.9 mg/dL (ref 0.2–1.2)
Total Protein: 8 g/dL (ref 6.0–8.3)

## 2014-07-22 LAB — HEMOGLOBIN A1C: Hgb A1c MFr Bld: 6.3 % (ref 4.6–6.5)

## 2014-07-22 NOTE — Patient Instructions (Signed)
Consider Curcumen caps daily   Back Pain, Adult Low back pain is very common. About 1 in 5 people have back pain.The cause of low back pain is rarely dangerous. The pain often gets better over time.About half of people with a sudden onset of back pain feel better in just 2 weeks. About 8 in 10 people feel better by 6 weeks.  CAUSES Some common causes of back pain include:  Strain of the muscles or ligaments supporting the spine.  Wear and tear (degeneration) of the spinal discs.  Arthritis.  Direct injury to the back. DIAGNOSIS Most of the time, the direct cause of low back pain is not known.However, back pain can be treated effectively even when the exact cause of the pain is unknown.Answering your caregiver's questions about your overall health and symptoms is one of the most accurate ways to make sure the cause of your pain is not dangerous. If your caregiver needs more information, he or she may order lab work or imaging tests (X-rays or MRIs).However, even if imaging tests show changes in your back, this usually does not require surgery. HOME CARE INSTRUCTIONS For many people, back pain returns.Since low back pain is rarely dangerous, it is often a condition that people can learn to [facility] their own.   Remain active. It is stressful on the back to sit or stand in one place. Do not sit, drive, or stand in one place for more than 30 minutes at a time. Take short walks on level surfaces as soon as pain allows.Try to increase the length of time you walk each day.  Do not stay in bed.Resting more than 1 or 2 days can delay your recovery.  Do not avoid exercise or work.Your body is made to move.It is not dangerous to be active, even though your back may hurt.Your back will likely heal faster if you return to being active before your pain is gone.  Pay attention to your body when you bend and lift. Many people have less discomfortwhen lifting if they bend their knees, keep the  load close to their bodies,and avoid twisting. Often, the most comfortable positions are those that put less stress on your recovering back.  Find a comfortable position to sleep. Use a firm mattress and lie on your side with your knees slightly bent. If you lie on your back, put a pillow under your knees.  Only take over-the-counter or prescription medicines as directed by your caregiver. Over-the-counter medicines to reduce pain and inflammation are often the most helpful.Your caregiver may prescribe muscle relaxant drugs.These medicines help dull your pain so you can more quickly return to your normal activities and healthy exercise.  Put ice on the injured area.  Put ice in a plastic bag.  Place a towel between your skin and the bag.  Leave the ice on for 15-20 minutes, 03-04 times a day for the first 2 to 3 days. After that, ice and heat may be alternated to reduce pain and spasms.  Ask your caregiver about trying back exercises and gentle massage. This may be of some benefit.  Avoid feeling anxious or stressed.Stress increases muscle tension and can worsen back pain.It is important to recognize when you are anxious or stressed and learn ways to manage it.Exercise is a great option. SEEK MEDICAL CARE IF:  You have pain that is not relieved with rest or medicine.  You have pain that does not improve in 1 week.  You have new symptoms.  You  are generally not feeling well. SEEK IMMEDIATE MEDICAL CARE IF:   You have pain that radiates from your back into your legs.  You develop new bowel or bladder control problems.  You have unusual weakness or numbness in your arms or legs.  You develop nausea or vomiting.  You develop abdominal pain.  You feel faint. Document Released: 10/04/2005 Document Revised: 04/04/2012 Document Reviewed: 02/05/2014 [facility] Patient Information 2015 [place], [place]. This information is not intended to replace advice given to you by your health  care provider. Make sure you discuss any questions you have with your health care provider.

## 2014-07-22 NOTE — Progress Notes (Signed)
Patient ID: Heather Roberts, female   DOB: [birth year], 67 y.o.   MRN: [id number] Heather Roberts [id number] July 05, 1946 07/22/2014      Progress Note-Follow Up  Subjective  Chief Complaint  Chief Complaint  Patient presents with  . Follow-up    4 month  . Injections    flu    HPI  Patient is a 68 year old female in today for routine medical care. . Generally doing well. Reports blood sugars are ranging from 135-125. Denies polyuria or polydipsia. Continues to struggle with arthritic pains, responds some to topical treatments such as [employer] and [employer]. Denies CP/palp/SOB/HA/congestion/fevers/GI or GU c/o. Taking meds as prescribed  Past Medical History  Diagnosis Date  . History of DVT (deep vein thrombosis) 1994  . Hyperlipidemia   . Hypertension   . DJD (degenerative joint disease), lumbar   . Bouchard nodes (DJD hand)   . Allergic rhinitis   . Diabetes mellitus type II   . Anxiety   . Glaucoma   . Fibroids   . Palpitations 12/18/2012  . Nocturia 12/18/2012  . Anemia 03/19/2013  . Carotid artery disease 03/24/2014    Past Surgical History  Procedure Laterality Date  . Knee arthroscopy    . Cholecystectomy    . Abdominal hysterectomy      Family History  Problem Relation Age of Onset  . Breast cancer Sister     age 68  . Stroke    . Diabetes Paternal Grandmother   . Diabetes Mother     History   Social History  . Marital Status: Married    Spouse Name: Essick    Number of Children: 3  . Years of Education: N/A   Occupational History  . Homemaker     used to work for [employer]  . Smoking status: Former Smoker -- 0.30 packs/day for 8 years    Types: Cigarettes  . Smokeless tobacco: Never Used  . Alcohol Use: Yes     Comment: Rare  . Drug Use: No  . Sexual Activity: Yes    Partners: Male   Other Topics Concern  . 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 amLODipine (NORVASC) 5 MG tablet Take 0.5 tablets (2.5 mg total) by mouth daily.  30 tablet  5  . aspirin 81 MG tablet Take 81 mg by mouth daily.        Marland Kitchen atorvastatin (LIPITOR) 10 MG tablet TAKE 1 TABLET BY MOUTH EVERY DAY  90 tablet  1  . Cholecalciferol (VITAMIN D) 1000 UNITS capsule Take 1,000 Units by mouth. ocassionally      . fluticasone (FLONASE) 50 MCG/ACT nasal spray Place 2 sprays into both nostrils daily as needed for allergies or rhinitis.  16 g  6  . losartan (COZAAR) 100 MG tablet TAKE 1 TABLET BY MOUTH EVERY DAY  30 tablet  0  . metFORMIN (GLUCOPHAGE) 500 MG tablet TAKE 1 TABLET BY MOUTH TWICE DAILY WITH A MEAL  180 tablet  0  . multivitamin-iron-minerals-folic acid (CENTRUM) chewable tablet Chew 1 tablet by mouth daily.      Marland Kitchen  nitroGLYCERIN (NITROSTAT) 0.4 MG SL tablet Place 1 tablet (0.4 mg total) under the tongue every 5 (five) minutes as needed for chest pain. Repeat q 5 minutes X 3 doses. If no relief go to ER  25 tablet  1  . Probiotic Product (PROBIOTIC DAILY PO) Take by mouth as needed. Digestive Advantage Probiotic.      . simvastatin (ZOCOR) 40 MG tablet TAKE 1 TABLET BY MOUTH EVERY NIGHT AT BEDTIME  30 tablet  0   No current facility-administered medications on file prior to visit.    No Known Allergies  Review of Systems  Review of Systems  Constitutional: Negative for fever and malaise/fatigue.  HENT: Negative for congestion.   Eyes: Negative for discharge.  Respiratory: Negative for shortness of breath.   Cardiovascular: Negative for chest pain, palpitations and leg swelling.  Gastrointestinal: Negative for nausea, abdominal pain and diarrhea.  Genitourinary: Negative for dysuria.  Musculoskeletal: Negative for falls.  Skin: Negative for rash.  Neurological: Negative  for loss of consciousness and headaches.  Endo/Heme/Allergies: Negative for polydipsia.  Psychiatric/Behavioral: Negative for depression and suicidal ideas. The patient is not nervous/anxious and does not have insomnia.     Objective  BP 157/61  Pulse 64  Temp(Src) 98.3 F (36.8 C) (Oral)  Ht 5\' 1"  (1.549 m)  Wt 180 lb 9.6 oz (81.92 kg)  BMI 34.14 kg/m2  SpO2 98%  Physical Exam  Physical Exam  Constitutional: She is oriented to person, place, and time and well-developed, well-nourished, and in no distress. No distress.  HENT:  Head: Normocephalic and atraumatic.  Eyes: Conjunctivae are normal.  Neck: Neck supple. No thyromegaly present.  Cardiovascular: Normal rate, regular rhythm and normal heart sounds.   No murmur heard. Pulmonary/Chest: Effort normal and breath sounds normal. She has no wheezes.  Abdominal: She exhibits no distension and no mass.  Musculoskeletal: She exhibits no edema.  Lymphadenopathy:    She has no cervical adenopathy.  Neurological: She is alert and oriented to person, place, and time.  Skin: Skin is warm and dry. No rash noted. She is not diaphoretic.  Psychiatric: Memory, affect and judgment normal.    Lab Results  Component Value Date   TSH 1.078 03/18/2014   Lab Results  Component Value Date   WBC 6.8 03/18/2014   HGB 12.3 03/18/2014   HCT 35.9* 03/18/2014   MCV 89.5 03/18/2014   PLT 263 03/18/2014   Lab Results  Component Value Date   CREATININE 0.82 03/18/2014   BUN 13 03/18/2014   NA 137 03/18/2014   K 4.4 03/18/2014   CL 100 03/18/2014   CO2 25 03/18/2014   Lab Results  Component Value Date   ALT 16 03/18/2014   AST 21 03/18/2014   ALKPHOS 101 03/18/2014   BILITOT 0.5 03/18/2014   Lab Results  Component Value Date   CHOL 230* 03/18/2014   Lab Results  Component Value Date   HDL 58 03/18/2014   Lab Results  Component Value Date   LDLCALC 126* 03/18/2014   Lab Results  Component Value Date   TRIG 228* 03/18/2014   Lab Results  Component Value  Date   CHOLHDL 4.0 03/18/2014     Assessment & Plan  Essential hypertension Well controlled, no changes to meds. Encouraged heart healthy diet such as the DASH diet and exercise as tolerated. Improved on recheck  Hyperlipidemia associated with type 2 diabetes mellitus Tolerating statin, encouraged heart healthy diet, avoid trans fats, minimize simple carbs and saturated  fats. Increase exercise as tolerated  Diabetes mellitus type 2 in obese hgba1c acceptable, minimize simple carbs. Increase exercise as tolerated. Continue current meds  Palpitations No recent flares. Minimize simple carbs

## 2014-07-22 NOTE — Progress Notes (Signed)
Pre visit review using our clinic review tool, if applicable. No additional management support is needed unless otherwise documented below in the visit note. 

## 2014-07-24 ENCOUNTER — Other Ambulatory Visit: Payer: Self-pay | Admitting: Family Medicine

## 2014-07-24 NOTE — Telephone Encounter (Signed)
Rx request to pharmacy/SL[facility]  

## 2014-07-28 NOTE — Assessment & Plan Note (Signed)
Well controlled, no changes to meds. Encouraged heart healthy diet such as the DASH diet and exercise as tolerated. Improved on recheck 

## 2014-07-28 NOTE — Assessment & Plan Note (Signed)
hgba1c acceptable, minimize simple carbs. Increase exercise as tolerated. Continue current meds 

## 2014-07-28 NOTE — Assessment & Plan Note (Signed)
No recent flares. Minimize simple carbs

## 2014-07-28 NOTE — Assessment & Plan Note (Signed)
Tolerating statin, encouraged heart healthy diet, avoid trans fats, minimize simple carbs and saturated fats. Increase exercise as tolerated 

## 2014-08-07 ENCOUNTER — Other Ambulatory Visit: Payer: Self-pay | Admitting: Family Medicine

## 2014-08-19 ENCOUNTER — Encounter: Payer: Self-pay | Admitting: Family Medicine

## 2014-08-27 DIAGNOSIS — H25813 Combined forms of age-related cataract, bilateral: Secondary | ICD-10-CM | POA: Diagnosis not present

## 2014-09-02 DIAGNOSIS — H25811 Combined forms of age-related cataract, right eye: Secondary | ICD-10-CM | POA: Diagnosis not present

## 2014-09-02 DIAGNOSIS — H2511 Age-related nuclear cataract, right eye: Secondary | ICD-10-CM | POA: Diagnosis not present

## 2014-09-09 ENCOUNTER — Encounter: Payer: Self-pay | Admitting: Medical

## 2014-09-09 ENCOUNTER — Ambulatory Visit (INDEPENDENT_AMBULATORY_CARE_PROVIDER_SITE_OTHER): Payer: Medicare Other | Admitting: Medical

## 2014-09-09 VITALS — BP 174/79 | HR 70 | Temp 98.2°F | Ht 61.0 in | Wt 178.6 lb

## 2014-09-09 DIAGNOSIS — J0100 Acute maxillary sinusitis, unspecified: Secondary | ICD-10-CM | POA: Diagnosis not present

## 2014-09-09 DIAGNOSIS — I10 Essential (primary) hypertension: Secondary | ICD-10-CM | POA: Diagnosis not present

## 2014-09-09 DIAGNOSIS — I6529 Occlusion and stenosis of unspecified carotid artery: Secondary | ICD-10-CM

## 2014-09-09 MED ORDER — BENZONATATE 100 MG PO CAPS: 100.0000 mg | ORAL_CAPSULE | Freq: Three times a day (TID) | ORAL | Status: DC | PRN

## 2014-09-09 MED ORDER — AZITHROMYCIN 250 MG PO TABS: ORAL_TABLET | ORAL | Status: DC

## 2014-09-09 NOTE — Patient Instructions (Addendum)
You appear to have early sinusitis following allergy or upper respiratory infection. I am prescribing azithromycin antibiotic, benzonatate for cough, and continue your flonase  Check your bp and stop otc cough medication. I think your cough medication has raised your blood pressure. Check your bp daily over next 3-4 days. If bp remains elevated will need adjustment of your bp meds.  Follow up in 7 days or as needed.

## 2014-09-09 NOTE — Progress Notes (Signed)
Subjective:    Patient ID: Heather Roberts, female    DOB: 12/18/1945, 68 y.o.   MRN: [id number]  HPI   Pt in with some cough, congestion and runny nose. Pt states no heat in house  for 2 weeks. About 3:30 this am rt sided sinus pressure. No fever or chills.   Some sneezing.   Cataract surgery left eye maybe next week. Pt wants to be better before the surgery.  Past Medical History  Diagnosis Date  . History of DVT (deep vein thrombosis) 1994  . Hyperlipidemia   . Hypertension   . DJD (degenerative joint disease), lumbar   . Bouchard nodes (DJD hand)   . Allergic rhinitis   . Diabetes mellitus type II   . Anxiety   . Glaucoma   . Fibroids   . Palpitations 12/18/2012  . Nocturia 12/18/2012  . Anemia 03/19/2013  . Carotid artery disease 03/24/2014    History   Social History  . Marital Status: Married    Spouse Name: Essick    Number of Children: 3  . Years of Education: N/A   Occupational History  . Homemaker     used to work for [employer]  . Smoking status: Former Smoker -- 0.30 packs/day for 8 years    Types: Cigarettes  . Smokeless tobacco: Never Used  . Alcohol Use: Yes     Comment: Rare  . Drug Use: No  . Sexual Activity:    Partners: Male   Other Topics Concern  . 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 Knee arthroscopy    . Cholecystectomy    . Abdominal hysterectomy      Family History  Problem Relation Age of Onset  . Breast cancer Sister     age 58  . Stroke    . Diabetes Paternal Grandmother   . Diabetes Mother     No Known Allergies  Current Outpatient Prescriptions on File Prior to Visit  Medication Sig Dispense Refill  . amLODipine (NORVASC) 5 MG tablet Take 0.5 tablets (2.5 mg total) by  mouth daily. 30 tablet 5  . aspirin 81 MG tablet Take 81 mg by mouth daily.      Marland Kitchen atorvastatin (LIPITOR) 10 MG tablet TAKE 1 TABLET BY MOUTH EVERY DAY 30 tablet 0  . Cholecalciferol (VITAMIN D) 1000 UNITS capsule Take 1,000 Units by mouth. ocassionally    . fluticasone (FLONASE) 50 MCG/ACT nasal spray Place 2 sprays into both nostrils daily as needed for allergies or rhinitis. 16 g 6  . losartan (COZAAR) 100 MG tablet TAKE 1 TABLET BY MOUTH EVERY DAY 30 tablet 0  . metFORMIN (GLUCOPHAGE) 500 MG tablet TAKE 1 TABLET BY MOUTH TWICE DAILY WITH A MEAL 180 tablet 0  . multivitamin-iron-minerals-folic acid (CENTRUM) chewable tablet Chew 1 tablet by mouth daily.    . nitroGLYCERIN (NITROSTAT) 0.4 MG SL tablet Place 1 tablet (0.4 mg total) under the tongue every 5 (five) minutes as needed for chest pain. Repeat q 5 minutes X 3 doses. If no relief go to ER 25 tablet 1  . Probiotic Product (PROBIOTIC DAILY PO) Take  by mouth as needed. Digestive Advantage Probiotic.    . simvastatin (ZOCOR) 40 MG tablet TAKE 1 TABLET BY MOUTH EVERY NIGHT AT BEDTIME 30 tablet 0   No current facility-administered medications on file prior to visit.    BP 174/79 mmHg  Pulse 70  Temp(Src) 98.2 F (36.8 C) (Oral)  Ht 5\' 1"  (1.549 m)  Wt 178 lb 9.6 oz (81.012 kg)  BMI 33.76 kg/m2  SpO2 97%     Review of Systems  Constitutional: Negative for fever, chills and fatigue.  HENT: Positive for congestion, rhinorrhea, sinus pressure and sneezing. Negative for ear pain, mouth sores, nosebleeds, postnasal drip, sore throat, tinnitus and trouble swallowing.   Respiratory: Negative for cough, chest tightness, shortness of breath and wheezing.   Cardiovascular: Negative for chest pain and palpitations.  Gastrointestinal: Negative for vomiting.  Neurological: Negative for dizziness, seizures, syncope, facial asymmetry, weakness, light-headedness, numbness and headaches.  Hematological: Negative for adenopathy.    Psychiatric/Behavioral: Negative for dysphoric mood.         Objective:   Physical Exam   General  Mental Status - Alert. General Appearance - Well groomed. Not in acute distress.  Skin Rashes- No Rashes.  HEENT Head- Normal. Ear Auditory Canal - Left- Normal. Right - Normal.Tympanic Membrane- Left- Normal. Right- Normal. Eye Sclera/Conjunctiva- Left- Normal. Right- Normal. Nose & Sinuses Nasal Mucosa- Left-  Boggy + Congested. Right-  Boggy + Congested. Rt side frontal and maxilary sinus pressure. Mouth & Throat Lips: Upper Lip- Normal: no dryness, cracking, pallor, cyanosis, or vesicular eruption. Lower Lip-Normal: no dryness, cracking, pallor, cyanosis or vesicular eruption. Buccal Mucosa- Bilateral- No Aphthous ulcers. Oropharynx- No Discharge or Erythema. Tonsils: Characteristics- Bilateral- No Erythema or Congestion. Size/Enlargement- Bilateral- No enlargement. Discharge- bilateral-None.  Neck Neck- Supple. No Masses.   Chest and Lung Exam Auscultation: Breath Sounds:-Normal. CTA  Cardiovascular Auscultation:Rythm- Regular.  Murmurs & Other Heart Sounds:Ausculatation of the heart reveal- No Murmurs.  Lymphatic Head & Neck General Head & Neck Lymphatics: Bilateral: Description- No Localized lymphadenopathy.         Assessment & Plan:

## 2014-09-09 NOTE — Assessment & Plan Note (Signed)
You appear to have early sinusitis following allergy or upper respiratory infection. I am prescribing azithromycin antibiotic, benzonatate for cough, and continue your flonase

## 2014-09-09 NOTE — Progress Notes (Signed)
Pre visit review using our clinic review tool, if applicable. No additional management support is needed unless otherwise documented below in the visit note. 

## 2014-09-09 NOTE — Assessment & Plan Note (Signed)
Check your bp and stop otc cough medication. I think your cough medication has raised your blood pressure. Check your bp daily over next 3-4 days. If bp remains elevated will need adjustment of your bp meds.

## 2014-09-25 ENCOUNTER — Other Ambulatory Visit: Payer: Self-pay | Admitting: Family Medicine

## 2014-09-26 ENCOUNTER — Other Ambulatory Visit: Payer: Self-pay | Admitting: Family Medicine

## 2014-10-07 DIAGNOSIS — H2512 Age-related nuclear cataract, left eye: Secondary | ICD-10-CM | POA: Diagnosis not present

## 2014-10-07 DIAGNOSIS — H25812 Combined forms of age-related cataract, left eye: Secondary | ICD-10-CM | POA: Diagnosis not present

## 2014-10-31 ENCOUNTER — Telehealth: Payer: Self-pay | Admitting: Family Medicine

## 2014-10-31 NOTE — Telephone Encounter (Signed)
Patient requested this to be sent in.

## 2014-11-01 LAB — HM DIABETES EYE EXAM

## 2014-11-01 NOTE — Telephone Encounter (Signed)
Rx's sent to the pharmacy by e-script.//AB/CMA 

## 2014-12-09 DIAGNOSIS — Z1231 Encounter for screening mammogram for malignant neoplasm of breast: Secondary | ICD-10-CM | POA: Diagnosis not present

## 2015-01-02 ENCOUNTER — Telehealth: Payer: Self-pay | Admitting: Family Medicine

## 2015-01-02 NOTE — Telephone Encounter (Signed)
Pre visit CPE letter sent

## 2015-01-17 ENCOUNTER — Encounter: Payer: Medicare Other | Admitting: Family Medicine

## 2015-01-20 ENCOUNTER — Encounter: Payer: Self-pay | Admitting: *Deleted

## 2015-01-20 ENCOUNTER — Telehealth: Payer: Self-pay | Admitting: *Deleted

## 2015-01-20 NOTE — Telephone Encounter (Signed)
Pre-Visit Call completed with patient and chart updated.   Pre-Visit Info documented in Specialty Comments under [employer].    

## 2015-01-20 NOTE — Addendum Note (Signed)
Addended by: Leticia Penna A on: 01/20/2015 04:39 PM   Modules accepted: Orders, Medications

## 2015-01-21 ENCOUNTER — Encounter: Payer: Self-pay | Admitting: Family Medicine

## 2015-01-21 ENCOUNTER — Ambulatory Visit (INDEPENDENT_AMBULATORY_CARE_PROVIDER_SITE_OTHER): Payer: Medicare Other | Admitting: Family Medicine

## 2015-01-21 VITALS — BP 132/80 | HR 84 | Temp 98.6°F | Resp 16 | Ht 61.0 in | Wt 179.0 lb

## 2015-01-21 DIAGNOSIS — E669 Obesity, unspecified: Secondary | ICD-10-CM

## 2015-01-21 DIAGNOSIS — J309 Allergic rhinitis, unspecified: Secondary | ICD-10-CM | POA: Diagnosis not present

## 2015-01-21 DIAGNOSIS — E1169 Type 2 diabetes mellitus with other specified complication: Secondary | ICD-10-CM | POA: Diagnosis not present

## 2015-01-21 DIAGNOSIS — Z0000 Encounter for general adult medical examination without abnormal findings: Secondary | ICD-10-CM

## 2015-01-21 DIAGNOSIS — E785 Hyperlipidemia, unspecified: Secondary | ICD-10-CM

## 2015-01-21 DIAGNOSIS — M19049 Primary osteoarthritis, unspecified hand: Secondary | ICD-10-CM

## 2015-01-21 DIAGNOSIS — I10 Essential (primary) hypertension: Secondary | ICD-10-CM

## 2015-01-21 DIAGNOSIS — IMO0002 Reserved for concepts with insufficient information to code with codable children: Secondary | ICD-10-CM

## 2015-01-21 DIAGNOSIS — M479 Spondylosis, unspecified: Secondary | ICD-10-CM

## 2015-01-21 DIAGNOSIS — M179 Osteoarthritis of knee, unspecified: Secondary | ICD-10-CM | POA: Diagnosis not present

## 2015-01-21 DIAGNOSIS — D649 Anemia, unspecified: Secondary | ICD-10-CM

## 2015-01-21 DIAGNOSIS — M1710 Unilateral primary osteoarthritis, unspecified knee: Secondary | ICD-10-CM

## 2015-01-21 LAB — COMPREHENSIVE METABOLIC PANEL
ALK PHOS: 114 U/L (ref 39–117)
ALT: 15 U/L (ref 0–35)
AST: 22 U/L (ref 0–37)
Albumin: 4.3 g/dL (ref 3.5–5.2)
BILIRUBIN TOTAL: 0.8 mg/dL (ref 0.2–1.2)
BUN: 11 mg/dL (ref 6–23)
CO2: 28 mEq/L (ref 19–32)
CREATININE: 0.83 mg/dL (ref 0.40–1.20)
Calcium: 10.1 mg/dL (ref 8.4–10.5)
Chloride: 100 mEq/L (ref 96–112)
GFR: 87.77 mL/min (ref >60.00)
GLUCOSE: 113 mg/dL — AB (ref 70–99)
Potassium: 4.1 mEq/L (ref 3.5–5.1)
Sodium: 137 mEq/L (ref 135–145)
Total Protein: 8.1 g/dL (ref 6.0–8.3)

## 2015-01-21 LAB — LIPID PANEL
CHOLESTEROL: 165 mg/dL (ref 0–200)
HDL: 66 mg/dL (ref >39.00)
LDL Cholesterol: 80 mg/dL (ref 0–99)
NonHDL: 99
Total CHOL/HDL Ratio: 3
Triglycerides: 97 mg/dL (ref 0.0–149.0)
VLDL: 19.4 mg/dL (ref 0.0–40.0)

## 2015-01-21 LAB — CBC
HCT: 39.6 % (ref 36.0–46.0)
Hemoglobin: 13.2 g/dL (ref 12.0–15.0)
MCHC: 33.3 g/dL (ref 30.0–36.0)
MCV: 90.6 fl (ref 78.0–100.0)
Platelets: 290 10*3/uL (ref 150.0–400.0)
RBC: 4.38 Mil/uL (ref 3.87–5.11)
RDW: 14.8 % (ref 11.5–15.5)
WBC: 6.1 10*3/uL (ref 4.0–10.5)

## 2015-01-21 LAB — TSH: TSH: 1.01 u[IU]/mL (ref 0.35–4.50)

## 2015-01-21 LAB — HEMOGLOBIN A1C: Hgb A1c MFr Bld: 6.7 % — ABNORMAL HIGH (ref 4.6–6.5)

## 2015-01-21 NOTE — Assessment & Plan Note (Signed)
Well controlled, no changes to meds. Encouraged heart healthy diet such as the DASH diet and exercise as tolerated.  °

## 2015-01-21 NOTE — Assessment & Plan Note (Signed)
Continue to use Flonase and add Cetirizine and nasal saline

## 2015-01-21 NOTE — Patient Instructions (Signed)
Need release of rec for [facility] from 11/2014  Preventive Care for Adults A healthy lifestyle and preventive care can promote health and wellness. Preventive health guidelines for women include the following key practices.  A routine yearly physical is a good way to check with your health care provider about your health and preventive screening. It is a chance to share any concerns and updates on your health and to receive a thorough exam.  Visit your dentist for a routine exam and preventive care every 6 months. Brush your teeth twice a day and floss once a day. Good oral hygiene prevents tooth decay and gum disease.  The frequency of eye exams is based on your age, health, family medical history, use of contact lenses, and other factors. Follow your health care provider's recommendations for frequency of eye exams.  Eat a healthy diet. Foods like vegetables, fruits, whole grains, low-fat dairy products, and lean protein foods contain the nutrients you need without too many calories. Decrease your intake of foods high in solid fats, added sugars, and salt. Eat the right amount of calories for you.Get information about a proper diet from your health care provider, if necessary.  Regular physical exercise is one of the most important things you can do for your health. Most adults should get at least 150 minutes of moderate-intensity exercise (any activity that increases your heart rate and causes you to sweat) each week. In addition, most adults need muscle-strengthening exercises on 2 or more days a week.  Maintain a healthy weight. The body mass index (BMI) is a screening tool to identify possible weight problems. It provides an estimate of body fat based on height and weight. Your health care provider can find your BMI and can help you achieve or maintain a healthy weight.For adults 20 years and older:  A BMI below 18.5 is considered underweight.  A BMI of 18.5 to 24.9 is  normal.  A BMI of 25 to 29.9 is considered overweight.  A BMI of 30 and above is considered obese.  Maintain normal blood lipids and cholesterol levels by exercising and minimizing your intake of saturated fat. Eat a balanced diet with plenty of fruit and vegetables. Blood tests for lipids and cholesterol should begin at age 79 and be repeated every 5 years. If your lipid or cholesterol levels are high, you are over 50, or you are at high risk for heart disease, you may need your cholesterol levels checked more frequently.Ongoing high lipid and cholesterol levels should be treated with medicines if diet and exercise are not working.  If you smoke, find out from your health care provider how to quit. If you do not use tobacco, do not start.  Lung cancer screening is recommended for adults aged 65-80 years who are at high risk for developing lung cancer because of a history of smoking. A yearly low-dose CT scan of the lungs is recommended for people who have at least a 30-pack-year history of smoking and are a current smoker or have quit within the past 15 years. A pack year of smoking is smoking an average of 1 pack of cigarettes a day for 1 year (for example: 1 pack a day for 30 years or 2 packs a day for 15 years). Yearly screening should continue until the smoker has stopped smoking for at least 15 years. Yearly screening should be stopped for people who develop a health problem that would prevent them from having lung cancer treatment.  If you are pregnant, do not drink alcohol. If you are breastfeeding, be very cautious about drinking alcohol. If you are not pregnant and choose to drink alcohol, do not have more than 1 drink per day. One drink is considered to be 12 ounces (355 mL) of beer, 5 ounces (148 mL) of wine, or 1.5 ounces (44 mL) of liquor.  Avoid use of street drugs. Do not share needles with anyone. Ask for help if you need support or instructions about stopping the use of  drugs.  High blood pressure causes heart disease and increases the risk of stroke. Your blood pressure should be checked at least every 1 to 2 years. Ongoing high blood pressure should be treated with medicines if weight loss and exercise do not work.  If you are 55-79 years old, ask your health care provider if you should take aspirin to prevent strokes.  Diabetes screening involves taking a blood sample to check your fasting blood sugar level. This should be done once every 3 years, after age 45, if you are within normal weight and without risk factors for diabetes. Testing should be considered at a younger age or be carried out more frequently if you are overweight and have at least 1 risk factor for diabetes.  Breast cancer screening is essential preventive care for women. You should practice "breast self-awareness." This means understanding the normal appearance and feel of your breasts and may include breast self-examination. Any changes detected, no matter how small, should be reported to a health care provider. Women in their 20s and 30s should have a clinical breast exam (CBE) by a health care provider as part of a regular health exam every 1 to 3 years. After age 40, women should have a CBE every year. Starting at age 40, women should consider having a mammogram (breast X-ray test) every year. Women who have a family history of breast cancer should talk to their health care provider about genetic screening. Women at a high risk of breast cancer should talk to their health care providers about having an MRI and a mammogram every year.  Breast cancer gene (BRCA)-related cancer risk assessment is recommended for women who have family members with BRCA-related cancers. BRCA-related cancers include breast, ovarian, tubal, and peritoneal cancers. Having family members with these cancers may be associated with an increased risk for harmful changes (mutations) in the breast cancer genes BRCA1 and BRCA2.  Results of the assessment will determine the need for genetic counseling and BRCA1 and BRCA2 testing.  Routine pelvic exams to screen for cancer are no longer recommended for nonpregnant women who are considered low risk for cancer of the pelvic organs (ovaries, uterus, and vagina) and who do not have symptoms. Ask your health care provider if a screening pelvic exam is right for you.  If you have had past treatment for cervical cancer or a condition that could lead to cancer, you need Pap tests and screening for cancer for at least 20 years after your treatment. If Pap tests have been discontinued, your risk factors (such as having a new sexual partner) need to be reassessed to determine if screening should be resumed. Some women have medical problems that increase the chance of getting cervical cancer. In these cases, your health care provider may recommend more frequent screening and Pap tests.  The HPV test is an additional test that may be used for cervical cancer screening. The HPV test looks for the virus that can cause the cell changes   on the cervix. The cells collected during the Pap test can be tested for HPV. The HPV test could be used to screen women aged 30 years and older, and should be used in women of any age who have unclear Pap test results. After the age of 30, women should have HPV testing at the same frequency as a Pap test.  Colorectal cancer can be detected and often prevented. Most routine colorectal cancer screening begins at the age of 50 years and continues through age 75 years. However, your health care provider may recommend screening at an earlier age if you have risk factors for colon cancer. On a yearly basis, your health care provider may provide home test kits to check for hidden blood in the stool. Use of a small camera at the end of a tube, to directly examine the colon (sigmoidoscopy or colonoscopy), can detect the earliest forms of colorectal cancer. Talk to your health  care provider about this at age 50, when routine screening begins. Direct exam of the colon should be repeated every 5-10 years through age 75 years, unless early forms of pre-cancerous polyps or small growths are found.  People who are at an increased risk for hepatitis B should be screened for this virus. You are considered at high risk for hepatitis B if:  You were born in a country where hepatitis B occurs often. Talk with your health care provider about which countries are considered high risk.  Your parents were born in a high-risk country and you have not received a shot to protect against hepatitis B (hepatitis B vaccine).  You have HIV or AIDS.  You use needles to inject street drugs.  You live with, or have sex with, someone who has hepatitis B.  You get hemodialysis treatment.  You take certain medicines for conditions like cancer, organ transplantation, and autoimmune conditions.  Hepatitis C blood testing is recommended for all people born from 1945 through 1965 and any individual with known risks for hepatitis C.  Practice safe sex. Use condoms and avoid high-risk sexual practices to reduce the spread of sexually transmitted infections (STIs). STIs include gonorrhea, chlamydia, syphilis, trichomonas, herpes, HPV, and human immunodeficiency virus (HIV). Herpes, HIV, and HPV are viral illnesses that have no cure. They can result in disability, cancer, and death.  You should be screened for sexually transmitted illnesses (STIs) including gonorrhea and chlamydia if:  You are sexually active and are younger than 24 years.  You are older than 24 years and your health care provider tells you that you are at risk for this type of infection.  Your sexual activity has changed since you were last screened and you are at an increased risk for chlamydia or gonorrhea. Ask your health care provider if you are at risk.  If you are at risk of being infected with HIV, it is recommended  that you take a prescription medicine daily to prevent HIV infection. This is called preexposure prophylaxis (PrEP). You are considered at risk if:  You are a heterosexual woman, are sexually active, and are at increased risk for HIV infection.  You take drugs by injection.  You are sexually active with a partner who has HIV.  Talk with your health care provider about whether you are at high risk of being infected with HIV. If you choose to begin PrEP, you should first be tested for HIV. You should then be tested every 3 months for as long as you are taking PrEP.    Osteoporosis is a disease in which the bones lose minerals and strength with aging. This can result in serious bone fractures or breaks. The risk of osteoporosis can be identified using a bone density scan. Women ages 23 years and over and women at risk for fractures or osteoporosis should discuss screening with their health care providers. Ask your health care provider whether you should take a calcium supplement or vitamin D to reduce the rate of osteoporosis.  Menopause can be associated with physical symptoms and risks. Hormone replacement therapy is available to decrease symptoms and risks. You should talk to your health care provider about whether hormone replacement therapy is right for you.  Use sunscreen. Apply sunscreen liberally and repeatedly throughout the day. You should seek shade when your shadow is shorter than you. Protect yourself by wearing long sleeves, pants, a wide-brimmed hat, and sunglasses year round, whenever you are outdoors.  Once a month, do a whole body skin exam, using a mirror to look at the skin on your back. Tell your health care provider of new moles, moles that have irregular borders, moles that are larger than a pencil eraser, or moles that have changed in shape or color.  Stay current with required vaccines (immunizations).  Influenza vaccine. All adults should be immunized every year.  Tetanus,  diphtheria, and acellular pertussis (Td, Tdap) vaccine. Pregnant women should receive 1 dose of Tdap vaccine during each pregnancy. The dose should be obtained regardless of the length of time since the last dose. Immunization is preferred during the 27th-36th week of gestation. An adult who has not previously received Tdap or who does not know her vaccine status should receive 1 dose of Tdap. This initial dose should be followed by tetanus and diphtheria toxoids (Td) booster doses every 10 years. Adults with an unknown or incomplete history of completing a 3-dose immunization series with Td-containing vaccines should begin or complete a primary immunization series including a Tdap dose. Adults should receive a Td booster every 10 years.  Varicella vaccine. An adult without evidence of immunity to varicella should receive 2 doses or a second dose if she has previously received 1 dose. Pregnant females who do not have evidence of immunity should receive the first dose after pregnancy. This first dose should be obtained before leaving the health care facility. The second dose should be obtained 4-8 weeks after the first dose.  Human papillomavirus (HPV) vaccine. Females aged 13-26 years who have not received the vaccine previously should obtain the 3-dose series. The vaccine is not recommended for use in pregnant females. However, pregnancy testing is not needed before receiving a dose. If a female is found to be pregnant after receiving a dose, no treatment is needed. In that case, the remaining doses should be delayed until after the pregnancy. Immunization is recommended for any person with an immunocompromised condition through the age of 34 years if she did not get any or all doses earlier. During the 3-dose series, the second dose should be obtained 4-8 weeks after the first dose. The third dose should be obtained 24 weeks after the first dose and 16 weeks after the second dose.  Zoster vaccine. One dose  is recommended for adults aged 49 years or older unless certain conditions are present.  Measles, mumps, and rubella (MMR) vaccine. Adults born before [birth year] generally are considered immune to measles and mumps. Adults born in [birth year] or later should have 1 or more doses of MMR vaccine unless there is a  contraindication to the vaccine or there is laboratory evidence of immunity to each of the three diseases. A routine second dose of MMR vaccine should be obtained at least 28 days after the first dose for students attending postsecondary schools, health care workers, or international travelers. People who received inactivated measles vaccine or an unknown type of measles vaccine during 1963-1967 should receive 2 doses of MMR vaccine. People who received inactivated mumps vaccine or an unknown type of mumps vaccine before 1979 and are at high risk for mumps infection should consider immunization with 2 doses of MMR vaccine. For females of childbearing age, rubella immunity should be determined. If there is no evidence of immunity, females who are not pregnant should be vaccinated. If there is no evidence of immunity, females who are pregnant should delay immunization until after pregnancy. Unvaccinated health care workers born before [birth year] who lack laboratory evidence of measles, mumps, or rubella immunity or laboratory confirmation of disease should consider measles and mumps immunization with 2 doses of MMR vaccine or rubella immunization with 1 dose of MMR vaccine.  Pneumococcal 13-valent conjugate (PCV13) vaccine. When indicated, a person who is uncertain of her immunization history and has no record of immunization should receive the PCV13 vaccine. An adult aged 72 years or older who has certain medical conditions and has not been previously immunized should receive 1 dose of PCV13 vaccine. This PCV13 should be followed with a dose of pneumococcal polysaccharide (PPSV23) vaccine. The PPSV23 vaccine dose should be  obtained at least 8 weeks after the dose of PCV13 vaccine. An adult aged 33 years or older who has certain medical conditions and previously received 1 or more doses of PPSV23 vaccine should receive 1 dose of PCV13. The PCV13 vaccine dose should be obtained 1 or more years after the last PPSV23 vaccine dose.  Pneumococcal polysaccharide (PPSV23) vaccine. When PCV13 is also indicated, PCV13 should be obtained first. All adults aged 50 years and older should be immunized. An adult younger than age 14 years who has certain medical conditions should be immunized. Any person who resides in a nursing home or long-term care facility should be immunized. An adult smoker should be immunized. People with an immunocompromised condition and certain other conditions should receive both PCV13 and PPSV23 vaccines. People with human immunodeficiency virus (HIV) infection should be immunized as soon as possible after diagnosis. Immunization during chemotherapy or radiation therapy should be avoided. Routine use of PPSV23 vaccine is not recommended for American Indians, [place]atives, or people younger than 65 years unless there are medical conditions that require PPSV23 vaccine. When indicated, people who have unknown immunization and have no record of immunization should receive PPSV23 vaccine. One-time revaccination 5 years after the first dose of PPSV23 is recommended for people aged 19-64 years who have chronic kidney failure, nephrotic syndrome, asplenia, or immunocompromised conditions. People who received 1-2 doses of PPSV23 before age 39 years should receive another dose of PPSV23 vaccine at age 43 years or later if at least 5 years have passed since the previous dose. Doses of PPSV23 are not needed for people immunized with PPSV23 at or after age 74 years.  Meningococcal vaccine. Adults with asplenia or persistent complement component deficiencies should receive 2 doses of quadrivalent meningococcal conjugate  (MenACWY-D) vaccine. The doses should be obtained at least 2 months apart. Microbiologists working with certain meningococcal bacteria, [employer] recruits, people at risk during an outbreak, and people who travel to or live in countries with a high rate  of meningitis should be immunized. A first-year college student up through age 21 years who is living in a residence hall should receive a dose if she did not receive a dose on or after her 16th birthday. Adults who have certain high-risk conditions should receive one or more doses of vaccine.  Hepatitis A vaccine. Adults who wish to be protected from this disease, have certain high-risk conditions, work with hepatitis A-infected animals, work in hepatitis A research labs, or travel to or work in countries with a high rate of hepatitis A should be immunized. Adults who were previously unvaccinated and who anticipate close contact with an international adoptee during the first 60 days after arrival in the [place] from a country with a high rate of hepatitis A should be immunized.  Hepatitis B vaccine. Adults who wish to be protected from this disease, have certain high-risk conditions, may be exposed to blood or other infectious body fluids, are household contacts or sex partners of hepatitis B positive people, are clients or workers in certain care facilities, or travel to or work in countries with a high rate of hepatitis B should be immunized.  Haemophilus influenzae type b (Hib) vaccine. A previously unvaccinated person with asplenia or sickle cell disease or having a scheduled splenectomy should receive 1 dose of Hib vaccine. Regardless of previous immunization, a recipient of a hematopoietic stem cell transplant should receive a 3-dose series 6-12 months after her successful transplant. Hib vaccine is not recommended for adults with HIV infection. Preventive Services / Frequency Ages 19 to 39 years  Blood pressure check.** / Every 1 to 2  years.  Lipid and cholesterol check.** / Every 5 years beginning at age 20.  Clinical breast exam.** / Every 3 years for women in their 20s and 30s.  BRCA-related cancer risk assessment.** / For women who have family members with a BRCA-related cancer (breast, ovarian, tubal, or peritoneal cancers).  Pap test.** / Every 2 years from ages 21 through 29. Every 3 years starting at age 30 through age 65 or 70 with a history of 3 consecutive normal Pap tests.  HPV screening.** / Every 3 years from ages 30 through ages 65 to 70 with a history of 3 consecutive normal Pap tests.  Hepatitis C blood test.** / For any individual with known risks for hepatitis C.  Skin self-exam. / Monthly.  Influenza vaccine. / Every year.  Tetanus, diphtheria, and acellular pertussis (Tdap, Td) vaccine.** / Consult your health care provider. Pregnant women should receive 1 dose of Tdap vaccine during each pregnancy. 1 dose of Td every 10 years.  Varicella vaccine.** / Consult your health care provider. Pregnant females who do not have evidence of immunity should receive the first dose after pregnancy.  HPV vaccine. / 3 doses over 6 months, if 26 and younger. The vaccine is not recommended for use in pregnant females. However, pregnancy testing is not needed before receiving a dose.  Measles, mumps, rubella (MMR) vaccine.** / You need at least 1 dose of MMR if you were born in 1957 or later. You may also need a 2nd dose. For females of childbearing age, rubella immunity should be determined. If there is no evidence of immunity, females who are not pregnant should be vaccinated. If there is no evidence of immunity, females who are pregnant should delay immunization until after pregnancy.  Pneumococcal 13-valent conjugate (PCV13) vaccine.** / Consult your health care provider.  Pneumococcal polysaccharide (PPSV23) vaccine.** / 1 to 2 doses if   you smoke cigarettes or if you have certain conditions.  Meningococcal  vaccine.** / 1 dose if you are age 19 to 21 years and a first-year college student living in a residence hall, or have one of several medical conditions, you need to get vaccinated against meningococcal disease. You may also need additional booster doses.  Hepatitis A vaccine.** / Consult your health care provider.  Hepatitis B vaccine.** / Consult your health care provider.  Haemophilus influenzae type b (Hib) vaccine.** / Consult your health care provider. Ages 40 to 64 years  Blood pressure check.** / Every 1 to 2 years.  Lipid and cholesterol check.** / Every 5 years beginning at age 20 years.  Lung cancer screening. / Every year if you are aged 55-80 years and have a 30-pack-year history of smoking and currently smoke or have quit within the past 15 years. Yearly screening is stopped once you have quit smoking for at least 15 years or develop a health problem that would prevent you from having lung cancer treatment.  Clinical breast exam.** / Every year after age 40 years.  BRCA-related cancer risk assessment.** / For women who have family members with a BRCA-related cancer (breast, ovarian, tubal, or peritoneal cancers).  Mammogram.** / Every year beginning at age 40 years and continuing for as long as you are in good health. Consult with your health care provider.  Pap test.** / Every 3 years starting at age 30 years through age 65 or 70 years with a history of 3 consecutive normal Pap tests.  HPV screening.** / Every 3 years from ages 30 years through ages 65 to 70 years with a history of 3 consecutive normal Pap tests.  Fecal occult blood test (FOBT) of stool. / Every year beginning at age 50 years and continuing until age 75 years. You may not need to do this test if you get a colonoscopy every 10 years.  Flexible sigmoidoscopy or colonoscopy.** / Every 5 years for a flexible sigmoidoscopy or every 10 years for a colonoscopy beginning at age 50 years and continuing until age 75  years.  Hepatitis C blood test.** / For all people born from 1945 through 1965 and any individual with known risks for hepatitis C.  Skin self-exam. / Monthly.  Influenza vaccine. / Every year.  Tetanus, diphtheria, and acellular pertussis (Tdap/Td) vaccine.** / Consult your health care provider. Pregnant women should receive 1 dose of Tdap vaccine during each pregnancy. 1 dose of Td every 10 years.  Varicella vaccine.** / Consult your health care provider. Pregnant females who do not have evidence of immunity should receive the first dose after pregnancy.  Zoster vaccine.** / 1 dose for adults aged 60 years or older.  Measles, mumps, rubella (MMR) vaccine.** / You need at least 1 dose of MMR if you were born in 1957 or later. You may also need a 2nd dose. For females of childbearing age, rubella immunity should be determined. If there is no evidence of immunity, females who are not pregnant should be vaccinated. If there is no evidence of immunity, females who are pregnant should delay immunization until after pregnancy.  Pneumococcal 13-valent conjugate (PCV13) vaccine.** / Consult your health care provider.  Pneumococcal polysaccharide (PPSV23) vaccine.** / 1 to 2 doses if you smoke cigarettes or if you have certain conditions.  Meningococcal vaccine.** / Consult your health care provider.  Hepatitis A vaccine.** / Consult your health care provider.  Hepatitis B vaccine.** / Consult your health care provider.  Haemophilus   influenzae type b (Hib) vaccine.** / Consult your health care provider. Ages 65 years and over  Blood pressure check.** / Every 1 to 2 years.  Lipid and cholesterol check.** / Every 5 years beginning at age 20 years.  Lung cancer screening. / Every year if you are aged 55-80 years and have a 30-pack-year history of smoking and currently smoke or have quit within the past 15 years. Yearly screening is stopped once you have quit smoking for at least 15 years or  develop a health problem that would prevent you from having lung cancer treatment.  Clinical breast exam.** / Every year after age 40 years.  BRCA-related cancer risk assessment.** / For women who have family members with a BRCA-related cancer (breast, ovarian, tubal, or peritoneal cancers).  Mammogram.** / Every year beginning at age 40 years and continuing for as long as you are in good health. Consult with your health care provider.  Pap test.** / Every 3 years starting at age 30 years through age 65 or 70 years with 3 consecutive normal Pap tests. Testing can be stopped between 65 and 70 years with 3 consecutive normal Pap tests and no abnormal Pap or HPV tests in the past 10 years.  HPV screening.** / Every 3 years from ages 30 years through ages 65 or 70 years with a history of 3 consecutive normal Pap tests. Testing can be stopped between 65 and 70 years with 3 consecutive normal Pap tests and no abnormal Pap or HPV tests in the past 10 years.  Fecal occult blood test (FOBT) of stool. / Every year beginning at age 50 years and continuing until age 75 years. You may not need to do this test if you get a colonoscopy every 10 years.  Flexible sigmoidoscopy or colonoscopy.** / Every 5 years for a flexible sigmoidoscopy or every 10 years for a colonoscopy beginning at age 50 years and continuing until age 75 years.  Hepatitis C blood test.** / For all people born from 1945 through 1965 and any individual with known risks for hepatitis C.  Osteoporosis screening.** / A one-time screening for women ages 65 years and over and women at risk for fractures or osteoporosis.  Skin self-exam. / Monthly.  Influenza vaccine. / Every year.  Tetanus, diphtheria, and acellular pertussis (Tdap/Td) vaccine.** / 1 dose of Td every 10 years.  Varicella vaccine.** / Consult your health care provider.  Zoster vaccine.** / 1 dose for adults aged 60 years or older.  Pneumococcal 13-valent conjugate  (PCV13) vaccine.** / Consult your health care provider.  Pneumococcal polysaccharide (PPSV23) vaccine.** / 1 dose for all adults aged 65 years and older.  Meningococcal vaccine.** / Consult your health care provider.  Hepatitis A vaccine.** / Consult your health care provider.  Hepatitis B vaccine.** / Consult your health care provider.  Haemophilus influenzae type b (Hib) vaccine.** / Consult your health care provider. ** Family history and personal history of risk and conditions may change your health care provider's recommendations. Document Released: 11/30/2001 Document Revised: 02/18/2014 Document Reviewed: 03/01/2011 Exit[employer] Patient Information 2015 [employer]. This information is not intended to replace advice given to you by your health care provider. Make sure you discuss any questions you have with your health care provider.  

## 2015-01-24 ENCOUNTER — Telehealth: Payer: Self-pay | Admitting: Family Medicine

## 2015-01-24 NOTE — Telephone Encounter (Signed)
Caller name:Williams, Omni Relation to OJ:JKKX Call back number:[phone] Pharmacy:  Reason for call: pt would like to get her results from her labs that were done on tuesday

## 2015-01-24 NOTE — Telephone Encounter (Signed)
Spoke with Pt, informed her that letter was placed in mail on 01/22/2015 with lab results. All labs normal per Dr. Charlett Blake. Pt verbalized understanding.

## 2015-01-26 ENCOUNTER — Encounter: Payer: Self-pay | Admitting: Family Medicine

## 2015-01-26 DIAGNOSIS — Z0000 Encounter for general adult medical examination without abnormal findings: Secondary | ICD-10-CM | POA: Insufficient documentation

## 2015-01-26 NOTE — Assessment & Plan Note (Signed)
Encouraged to stay active, use Curcumen, try Salon Pas gel.

## 2015-01-26 NOTE — Progress Notes (Signed)
Heather Roberts  [id number] 02/04/1946 01/26/2015      Progress Note-Follow Up  Subjective  Chief Complaint  Chief Complaint  Patient presents with  . Medicare Wellness    HPI  Patient is a 69 y.o. female in today for routine medical care. Patient is in today for routine follow-up. Overall feeling well. Did recently have cataract surgery with good results is seeing much better. No recent illness. Is struggling with some pain. Struggles with some low back pain as well as some swelling and discomfort at her DIP joint of her right second finger. There is no redness or warmth. There is been no acute injury. Congestion and allergies have been bothering her. Antihistamines and nasal steroids helped slightly. Denies CP/palp/SOB/HA/congestion/fevers/GI or GU c/o. Taking meds as prescribed  Past Medical History  Diagnosis Date  . History of DVT (deep vein thrombosis) 1994  . Hyperlipidemia   . Hypertension   . DJD (degenerative joint disease), lumbar   . Bouchard nodes (DJD hand)   . Allergic rhinitis   . Diabetes mellitus type II   . Anxiety   . Glaucoma   . Fibroids   . Palpitations 12/18/2012  . Nocturia 12/18/2012  . Anemia 03/19/2013  . Carotid artery disease 03/24/2014    Past Surgical History  Procedure Laterality Date  . Knee arthroscopy    . Cholecystectomy    . Abdominal hysterectomy    . Eye surgery  09/2014, 08/2014    Cataracts removal    Family History  Problem Relation Age of Onset  . Stroke    . Diabetes Paternal Grandmother   . Diabetes Mother   . Kidney disease Mother   . COPD Father     smoker  . Stroke Sister   . Breast cancer Sister     s/p mastectomy  . COPD Brother     History   Social History  . Marital Status: Married    Spouse Name: Essick  . Number of Children: 3  . Years of Education: N/A   Occupational History  . Homemaker     used to work for [employer]  . Smoking status: Former Smoker --  0.30 packs/day for 8 years    Types: Cigarettes  . Smokeless tobacco: Never Used  . Alcohol Use: Yes     Comment: Rare  . Drug Use: No  . Sexual Activity:    Partners: Male     Comment: lives with husband, no dietary restrictions, minimizes dairy is walking more now   Other Topics Concern  . 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 amLODipine (NORVASC) 5 MG tablet TAKE 1 TABLET BY MOUTH EVERY DAY 90 tablet 1  . aspirin 81 MG tablet Take 81 mg by mouth daily.      Marland Kitchen atorvastatin (LIPITOR) 10 MG tablet TAKE 1 TABLET BY MOUTH EVERY DAY 30 tablet 0  . Cholecalciferol (VITAMIN D) 1000 UNITS capsule Take 1,000 Units by mouth. ocassionally    . Cyanocobalamin (B-12) 1000 MCG CAPS Take 1 tablet by mouth daily.    . fluticasone (FLONASE) 50 MCG/ACT nasal  spray Place 2 sprays into both nostrils daily as needed for allergies or rhinitis. 16 g 6  . losartan (COZAAR) 100 MG tablet TAKE 1 TABLET BY MOUTH EVERY DAY 30 tablet 0  . metFORMIN (GLUCOPHAGE) 500 MG tablet TAKE 1 TABLET BY MOUTH TWICE DAILY WITH MEALS 180 tablet 1  . nitroGLYCERIN (NITROSTAT) 0.4 MG SL tablet Place 1 tablet (0.4 mg total) under the tongue every 5 (five) minutes as needed for chest pain. Repeat q 5 minutes X 3 doses. If no relief go to ER 25 tablet 1  . Probiotic Product (PROBIOTIC DAILY PO) Take by mouth as needed. Digestive Advantage Probiotic.    . simvastatin (ZOCOR) 40 MG tablet TAKE 1 TABLET BY MOUTH EVERY NIGHT AT BEDTIME 30 tablet 0  . Turmeric 500 MG CAPS Take 1 tablet by mouth daily.     No current facility-administered medications on file prior to visit.    No Known Allergies  Review of Systems  Review of Systems  Constitutional: Positive for malaise/fatigue. Negative  for fever.  HENT: Positive for congestion.   Eyes: Negative for discharge.  Respiratory: Negative for shortness of breath.   Cardiovascular: Negative for chest pain, palpitations and leg swelling.  Gastrointestinal: Negative for nausea, abdominal pain and diarrhea.  Genitourinary: Negative for dysuria.  Musculoskeletal: Positive for joint pain. Negative for falls.  Skin: Negative for rash.  Neurological: Negative for loss of consciousness and headaches.  Endo/Heme/Allergies: Negative for polydipsia.  Psychiatric/Behavioral: Negative for depression and suicidal ideas. The patient is not nervous/anxious and does not have insomnia.     Objective  BP 132/80 mmHg  Pulse 84  Temp(Src) 98.6 F (37 C) (Oral)  Resp 16  Ht 5\' 1"  (1.549 m)  Wt 179 lb (81.194 kg)  BMI 33.84 kg/m2  Physical Exam  Physical Exam  Constitutional: She is oriented to person, place, and time and well-developed, well-nourished, and in no distress. No distress.  HENT:  Head: Normocephalic and atraumatic.  Right Ear: External ear normal.  Left Ear: External ear normal.  Nose: Nose normal.  Mouth/Throat: Oropharynx is clear and moist. No oropharyngeal exudate.  Nasal mucosa boggy and erythematous  Eyes: Conjunctivae are normal. Pupils are equal, round, and reactive to light. Right eye exhibits no discharge. Left eye exhibits no discharge. No scleral icterus.  Neck: Normal range of motion. Neck supple. No thyromegaly present.  Cardiovascular: Normal rate, regular rhythm, normal heart sounds and intact distal pulses.   No murmur heard. Pulmonary/Chest: Effort normal and breath sounds normal. No respiratory distress. She has no wheezes. She has no rales.  Abdominal: Soft. Bowel sounds are normal. She exhibits no distension and no mass. There is no tenderness.  Musculoskeletal: Normal range of motion. She exhibits no edema or tenderness.  Lymphadenopathy:    She has no cervical adenopathy.  Neurological: She is  alert and oriented to person, place, and time. She has normal reflexes. No cranial nerve deficit. Coordination normal.  Skin: Skin is warm and dry. No rash noted. She is not diaphoretic.  Psychiatric: Mood, memory and affect normal.    Lab Results  Component Value Date   TSH 1.01 01/21/2015   Lab Results  Component Value Date   WBC 6.1 01/21/2015   HGB 13.2 01/21/2015   HCT 39.6 01/21/2015   MCV 90.6 01/21/2015   PLT 290.0 01/21/2015   Lab Results  Component Value Date   CREATININE 0.83 01/21/2015   BUN 11 01/21/2015   NA 137 01/21/2015   K  4.1 01/21/2015   CL 100 01/21/2015   CO2 28 01/21/2015   Lab Results  Component Value Date   ALT 15 01/21/2015   AST 22 01/21/2015   ALKPHOS 114 01/21/2015   BILITOT 0.8 01/21/2015   Lab Results  Component Value Date   CHOL 165 01/21/2015   Lab Results  Component Value Date   HDL 66.00 01/21/2015   Lab Results  Component Value Date   LDLCALC 80 01/21/2015   Lab Results  Component Value Date   TRIG 97.0 01/21/2015   Lab Results  Component Value Date   CHOLHDL 3 01/21/2015     Assessment & Plan  Essential hypertension Well controlled, no changes to meds. Encouraged heart healthy diet such as the DASH diet and exercise as tolerated.    Allergic rhinitis Continue to use Flonase and add Cetirizine and nasal saline   Diabetes mellitus type 2 in obese [id number] acceptable, minimize simple carbs. Increase exercise as tolerated. Continue current meds   Hyperlipidemia associated with type 2 diabetes mellitus Tolerating statin, encouraged heart healthy diet, avoid trans fats, minimize simple carbs and saturated fats. Increase exercise as tolerated   Osteoarthrosis, hand Encouraged to stay active, use Curcumen, try Salon Pas gel.    Medicare annual wellness visit, subsequent Follows with Dr Stanford Breed of cardiology,  Follows with opthamology, dr Judi Cong, recently had cataracts removed with good results Sees Dr Hulan Fray in  [place], gastroenterology, last colonoscopy in 2007 [facility] in February 2016 Bone Density 2013 normal  Patient denies any difficulties at home. No trouble with ADLs, depression or falls. No recent changes to vision or hearing. Is UTD with immunizations. Is UTD with screening. Discussed Advanced Directives, patient agrees to bring [place] copies of documents if can. Encouraged heart healthy diet, exercise as tolerated and adequate sleep. Annual labs ordered and reviewed See problem list for risk factors and see AVS for preventative healthcare reommendations

## 2015-01-26 NOTE — Assessment & Plan Note (Addendum)
Follows with Dr Stanford Breed of cardiology,  Follows with opthamology, dr Judi Cong, recently had cataracts removed with good results Sees Dr Hulan Fray in [place], gastroenterology, last colonoscopy in 2007 [facility] in February 2016 Bone Density 2013 normal  Patient denies any difficulties at home. No trouble with ADLs, depression or falls. No recent changes to vision or hearing. Is UTD with immunizations. Is UTD with screening. Discussed Advanced Directives, patient agrees to bring [place] copies of documents if can. Encouraged heart healthy diet, exercise as tolerated and adequate sleep. Annual labs ordered and reviewed See problem list for risk factors and see AVS for preventative healthcare reommendations

## 2015-01-26 NOTE — Assessment & Plan Note (Signed)
hgba1c acceptable, minimize simple carbs. Increase exercise as tolerated. Continue current meds 

## 2015-01-26 NOTE — Assessment & Plan Note (Signed)
Tolerating statin, encouraged heart healthy diet, avoid trans fats, minimize simple carbs and saturated fats. Increase exercise as tolerated 

## 2015-01-30 ENCOUNTER — Other Ambulatory Visit: Payer: Self-pay | Admitting: Cardiology

## 2015-01-30 NOTE — Telephone Encounter (Signed)
Please call and see if patient is going to f/u with [place] or if we need to send this refill to dr blythe.  Thanks for all you do.

## 2015-02-10 ENCOUNTER — Telehealth: Payer: Self-pay | Admitting: Cardiology

## 2015-02-10 NOTE — Telephone Encounter (Signed)
Closed encounter °

## 2015-02-26 ENCOUNTER — Emergency Department (HOSPITAL_BASED_OUTPATIENT_CLINIC_OR_DEPARTMENT_OTHER): Payer: Medicare Other

## 2015-02-26 ENCOUNTER — Emergency Department (HOSPITAL_BASED_OUTPATIENT_CLINIC_OR_DEPARTMENT_OTHER)
Admission: EM | Admit: 2015-02-26 | Discharge: 2015-02-26 | Disposition: A | Discharge status: Home / Self Care | Payer: Medicare Other | Attending: Emergency Medicine | Admitting: Emergency Medicine

## 2015-02-26 ENCOUNTER — Encounter (HOSPITAL_BASED_OUTPATIENT_CLINIC_OR_DEPARTMENT_OTHER): Payer: Self-pay | Admitting: Emergency Medicine

## 2015-02-26 DIAGNOSIS — I10 Essential (primary) hypertension: Secondary | ICD-10-CM | POA: Insufficient documentation

## 2015-02-26 DIAGNOSIS — X58XXXA Exposure to other specified factors, initial encounter: Secondary | ICD-10-CM | POA: Diagnosis not present

## 2015-02-26 DIAGNOSIS — F419 Anxiety disorder, unspecified: Secondary | ICD-10-CM | POA: Diagnosis not present

## 2015-02-26 DIAGNOSIS — Z862 Personal history of diseases of the blood and blood-forming organs and certain disorders involving the immune mechanism: Secondary | ICD-10-CM | POA: Diagnosis not present

## 2015-02-26 DIAGNOSIS — Y998 Other external cause status: Secondary | ICD-10-CM | POA: Insufficient documentation

## 2015-02-26 DIAGNOSIS — S3992XA Unspecified injury of lower back, initial encounter: Secondary | ICD-10-CM | POA: Diagnosis not present

## 2015-02-26 DIAGNOSIS — Y9289 Other specified places as the place of occurrence of the external cause: Secondary | ICD-10-CM | POA: Diagnosis not present

## 2015-02-26 DIAGNOSIS — Z86718 Personal history of other venous thrombosis and embolism: Secondary | ICD-10-CM | POA: Insufficient documentation

## 2015-02-26 DIAGNOSIS — Z86018 Personal history of other benign neoplasm: Secondary | ICD-10-CM | POA: Insufficient documentation

## 2015-02-26 DIAGNOSIS — E785 Hyperlipidemia, unspecified: Secondary | ICD-10-CM | POA: Diagnosis not present

## 2015-02-26 DIAGNOSIS — Z79899 Other long term (current) drug therapy: Secondary | ICD-10-CM | POA: Diagnosis not present

## 2015-02-26 DIAGNOSIS — M546 Pain in thoracic spine: Secondary | ICD-10-CM

## 2015-02-26 DIAGNOSIS — Z7982 Long term (current) use of aspirin: Secondary | ICD-10-CM | POA: Diagnosis not present

## 2015-02-26 DIAGNOSIS — E119 Type 2 diabetes mellitus without complications: Secondary | ICD-10-CM | POA: Diagnosis not present

## 2015-02-26 DIAGNOSIS — Z8739 Personal history of other diseases of the musculoskeletal system and connective tissue: Secondary | ICD-10-CM | POA: Insufficient documentation

## 2015-02-26 DIAGNOSIS — Y9389 Activity, other specified: Secondary | ICD-10-CM | POA: Insufficient documentation

## 2015-02-26 DIAGNOSIS — Z87891 Personal history of nicotine dependence: Secondary | ICD-10-CM | POA: Insufficient documentation

## 2015-02-26 DIAGNOSIS — Z8669 Personal history of other diseases of the nervous system and sense organs: Secondary | ICD-10-CM | POA: Diagnosis not present

## 2015-02-26 LAB — URINALYSIS, ROUTINE W REFLEX MICROSCOPIC
Bilirubin Urine: NEGATIVE
Glucose, UA: NEGATIVE mg/dL
HGB URINE DIPSTICK: NEGATIVE
Ketones, ur: NEGATIVE mg/dL
LEUKOCYTES UA: NEGATIVE
Nitrite: NEGATIVE
PH: 7 (ref 5.0–8.0)
Protein, ur: NEGATIVE mg/dL
Specific Gravity, Urine: 1.008 (ref 1.005–1.030)
Urobilinogen, UA: 0.2 mg/dL (ref 0.0–1.0)

## 2015-02-26 LAB — CBG MONITORING, ED: GLUCOSE-CAPILLARY: 149 mg/dL — AB (ref 70–99)

## 2015-02-26 MED ORDER — HYDROCODONE-ACETAMINOPHEN 5-325 MG PO TABS
1.0000 | ORAL_TABLET | Freq: Once | ORAL | Status: AC
  Administered 2015-02-26: 1 via ORAL
  Filled 2015-02-26: qty 1

## 2015-02-26 MED ORDER — HYDROCODONE-ACETAMINOPHEN 5-325 MG PO TABS: 1.0000 | ORAL_TABLET | Freq: Four times a day (QID) | ORAL | Status: DC | PRN

## 2015-02-26 NOTE — ED Provider Notes (Signed)
CSN: [id number]     Arrival date & time 02/26/15  0350 History   First MD Initiated Contact with Patient 02/26/15 0402     Chief Complaint  Patient presents with  . Back Pain     (Consider location/radiation/quality/duration/timing/severity/associated sxs/prior Treatment) HPI  This is a 69 year old female who was climbing up and down a ladder multiple times four days ago. She felt a pop in her right posterior back. There was mild associated pain that has gradually worsened since. The pain is now a 7 out of 10. It is worse with movement or deep breathing. The pain is well localized. She is also having increased thirst and urinary frequency.  Past Medical History  Diagnosis Date  . History of DVT (deep vein thrombosis) 1994  . Hyperlipidemia   . Hypertension   . DJD (degenerative joint disease), lumbar   . Bouchard nodes (DJD hand)   . Allergic rhinitis   . Diabetes mellitus type II   . Anxiety   . Glaucoma   . Fibroids   . Palpitations 12/18/2012  . Nocturia 12/18/2012  . Anemia 03/19/2013  . Carotid artery disease 03/24/2014   Past Surgical History  Procedure Laterality Date  . Knee arthroscopy    . Cholecystectomy    . Abdominal hysterectomy    . Eye surgery  09/2014, 08/2014    Cataracts removal   Family History  Problem Relation Age of Onset  . Stroke    . Diabetes Paternal Grandmother   . Diabetes Mother   . Kidney disease Mother   . COPD Father     smoker  . Stroke Sister   . Breast cancer Sister     s/p mastectomy  . COPD Brother    History  Substance Use Topics  . Smoking status: Former Smoker -- 0.30 packs/day for 8 years    Types: Cigarettes  . Smokeless tobacco: Never Used  . Alcohol Use: Yes     Comment: Rare   OB History    Gravida Para Term Preterm AB TAB SAB Ectopic Multiple Living   3 3 3       3      Review of Systems  All other systems reviewed and are negative.   Allergies  Review of patient's allergies indicates no known  allergies.  Home Medications   Prior to Admission medications   Medication Sig Start Date End Date Taking? Authorizing Provider  amLODipine (NORVASC) 5 MG tablet TAKE 1 TABLET BY MOUTH EVERY DAY 11/01/14  Yes Mosie Lukes, MD  aspirin 81 MG tablet Take 81 mg by mouth daily.     Yes Historical Provider, MD  atorvastatin (LIPITOR) 10 MG tablet TAKE 1 TABLET BY MOUTH EVERY DAY 08/07/14  Yes Mosie Lukes, MD  Cholecalciferol (VITAMIN D) 1000 UNITS capsule Take 1,000 Units by mouth. ocassionally   Yes Historical Provider, MD  Cyanocobalamin (B-12) 1000 MCG CAPS Take 1 tablet by mouth daily.   Yes Historical Provider, MD  fluticasone (FLONASE) 50 MCG/ACT nasal spray Place 2 sprays into both nostrils daily as needed for allergies or rhinitis. 12/17/13  Yes Mosie Lukes, MD  losartan (COZAAR) 100 MG tablet TAKE 1 TABLET BY MOUTH EVERY DAY 05/20/14  Yes Lelon Perla, MD  metFORMIN (GLUCOPHAGE) 500 MG tablet TAKE 1 TABLET BY MOUTH TWICE DAILY WITH MEALS 11/01/14  Yes Mosie Lukes, MD  Probiotic Product (PROBIOTIC DAILY PO) Take by mouth as needed. Digestive Advantage Probiotic.   Yes Historical Provider, MD  simvastatin (ZOCOR) 40 MG tablet TAKE 1 TABLET BY MOUTH EVERY NIGHT AT BEDTIME 09/26/14  Yes Mosie Lukes, MD  Turmeric 500 MG CAPS Take 1 tablet by mouth daily.   Yes Historical Provider, MD   BP 172/81 mmHg  Pulse 81  Temp(Src) 97.8 F (36.6 C) (Oral)  Resp 18  Ht 5\' 1"  (1.549 m)  Wt 178 lb (80.74 kg)  BMI 33.65 kg/m2  SpO2 100%   Physical Exam  General: Well-developed, well-nourished female in no acute distress; appearance consistent with age of record HENT: normocephalic; atraumatic Eyes: pupils equal, round and reactive to light; extraocular muscles intact; lens implants Neck: supple Heart: regular rate and rhythm Lungs: clear to auscultation bilaterally Chest: Left lower posterior rib point tenderness without deformity or crepitus Abdomen: soft; nondistended; nontender;  bowel sounds present Extremities: No deformity; full range of motion Neurologic: Awake, alert and oriented; motor function intact in all extremities and symmetric; no facial droop Skin: Warm and dry Psychiatric: Normal mood and affect    ED Course  Procedures (including critical care time)   MDM  Nursing notes and vitals signs, including pulse oximetry, reviewed.  Summary of this visit's results, reviewed by myself:  Labs:  Results for orders placed or performed during the hospital encounter of 02/26/15 (from the past 24 hour(s))  Urinalysis, Routine w reflex microscopic     Status: None   Collection Time: 02/26/15  4:04 AM  Result Value Ref Range   Color, Urine YELLOW YELLOW   APPearance CLEAR CLEAR   Specific Gravity, Urine 1.008 1.005 - 1.030   pH 7.0 5.0 - 8.0   Glucose, UA NEGATIVE NEGATIVE mg/dL   Hgb urine dipstick NEGATIVE NEGATIVE   Bilirubin Urine NEGATIVE NEGATIVE   Ketones, ur NEGATIVE NEGATIVE mg/dL   Protein, ur NEGATIVE NEGATIVE mg/dL   Urobilinogen, UA 0.2 0.0 - 1.0 mg/dL   Nitrite NEGATIVE NEGATIVE   Leukocytes, UA NEGATIVE NEGATIVE  CBG monitoring, ED     Status: Abnormal   Collection Time: 02/26/15  4:25 AM  Result Value Ref Range   Glucose-Capillary 149 (H) 70 - 99 mg/dL    Imaging Studies: Dg Ribs Unilateral W/chest Left  02/26/2015   CLINICAL DATA:  Left upper back pain after installing ceiling fans this past Saturday.  EXAM: LEFT RIBS AND CHEST - 3+ VIEW  COMPARISON:  02/25/2010  FINDINGS: Normal heart size and pulmonary vascularity. No focal airspace disease or consolidation in the lungs. No blunting of costophrenic angles. No pneumothorax. Mediastinal contours appear intact. Calcification of the aorta.  Left ribs appear intact. No acute displaced fractures or focal bone lesions identified.  IMPRESSION: No evidence of active pulmonary disease.  Negative left ribs.   Electronically Signed   By: Lucienne Capers M.D.   On: 02/26/2015 04:55        Shanon Rosser, MD 02/26/15 [phone]

## 2015-02-26 NOTE — ED Notes (Signed)
Pt states she was climbing a ladder on Saturday, states she heard a "pop" in her lower back , since has developed pain to lower back. Tonight pain increased along with urinary frequency and reports painful to take a deep breath

## 2015-03-07 ENCOUNTER — Ambulatory Visit (HOSPITAL_BASED_OUTPATIENT_CLINIC_OR_DEPARTMENT_OTHER)
Admission: RE | Admit: 2015-03-07 | Discharge: 2015-03-07 | Disposition: A | Discharge status: Home / Self Care | Payer: Medicare Other | Source: Ambulatory Visit | Attending: Family Medicine | Admitting: Family Medicine

## 2015-03-07 ENCOUNTER — Ambulatory Visit (INDEPENDENT_AMBULATORY_CARE_PROVIDER_SITE_OTHER): Payer: Medicare Other | Admitting: Family Medicine

## 2015-03-07 ENCOUNTER — Encounter: Payer: Self-pay | Admitting: Family Medicine

## 2015-03-07 VITALS — BP 124/70 | HR 78 | Temp 98.4°F | Ht 61.0 in | Wt 184.0 lb

## 2015-03-07 DIAGNOSIS — M25519 Pain in unspecified shoulder: Secondary | ICD-10-CM

## 2015-03-07 DIAGNOSIS — M542 Cervicalgia: Secondary | ICD-10-CM

## 2015-03-07 DIAGNOSIS — M25512 Pain in left shoulder: Secondary | ICD-10-CM | POA: Insufficient documentation

## 2015-03-07 DIAGNOSIS — M4802 Spinal stenosis, cervical region: Secondary | ICD-10-CM | POA: Insufficient documentation

## 2015-03-07 DIAGNOSIS — E785 Hyperlipidemia, unspecified: Secondary | ICD-10-CM

## 2015-03-07 DIAGNOSIS — I10 Essential (primary) hypertension: Secondary | ICD-10-CM | POA: Diagnosis not present

## 2015-03-07 DIAGNOSIS — E1169 Type 2 diabetes mellitus with other specified complication: Secondary | ICD-10-CM

## 2015-03-07 DIAGNOSIS — E669 Obesity, unspecified: Secondary | ICD-10-CM

## 2015-03-07 DIAGNOSIS — E119 Type 2 diabetes mellitus without complications: Secondary | ICD-10-CM

## 2015-03-07 DIAGNOSIS — M19012 Primary osteoarthritis, left shoulder: Secondary | ICD-10-CM | POA: Diagnosis not present

## 2015-03-07 DIAGNOSIS — M4803 Spinal stenosis, cervicothoracic region: Secondary | ICD-10-CM | POA: Diagnosis not present

## 2015-03-07 DIAGNOSIS — M5030 Other cervical disc degeneration, unspecified cervical region: Secondary | ICD-10-CM | POA: Insufficient documentation

## 2015-03-07 DIAGNOSIS — M479 Spondylosis, unspecified: Secondary | ICD-10-CM

## 2015-03-07 DIAGNOSIS — M9971 Connective tissue and disc stenosis of intervertebral foramina of cervical region: Secondary | ICD-10-CM | POA: Diagnosis not present

## 2015-03-07 MED ORDER — AMLODIPINE BESYLATE 5 MG PO TABS
5.0000 mg | ORAL_TABLET | Freq: Every day | ORAL | Status: DC
Start: 2015-03-07 — End: 2015-04-16

## 2015-03-07 MED ORDER — METHYLPREDNISOLONE 4 MG PO TABS: ORAL_TABLET | ORAL | Status: DC

## 2015-03-07 MED ORDER — METFORMIN HCL 500 MG PO TABS: 500.0000 mg | ORAL_TABLET | Freq: Two times a day (BID) | ORAL | Status: DC

## 2015-03-07 MED ORDER — SIMVASTATIN 40 MG PO TABS: 40.0000 mg | ORAL_TABLET | Freq: Every day | ORAL | Status: DC

## 2015-03-07 MED ORDER — METHOCARBAMOL 500 MG PO TABS: 500.0000 mg | ORAL_TABLET | Freq: Three times a day (TID) | ORAL | Status: DC | PRN

## 2015-03-07 NOTE — Patient Instructions (Signed)

## 2015-03-07 NOTE — Progress Notes (Signed)
Pre visit review using our clinic review tool, if applicable. No additional management support is needed unless otherwise documented below in the visit note. 

## 2015-03-10 ENCOUNTER — Other Ambulatory Visit: Payer: Self-pay | Admitting: Family Medicine

## 2015-03-10 DIAGNOSIS — M25512 Pain in left shoulder: Secondary | ICD-10-CM

## 2015-03-11 ENCOUNTER — Other Ambulatory Visit: Payer: Self-pay | Admitting: Family Medicine

## 2015-03-11 MED ORDER — HYDROCODONE-ACETAMINOPHEN 10-325 MG PO TABS: ORAL_TABLET | ORAL | Status: DC

## 2015-03-11 NOTE — Telephone Encounter (Signed)
Printed hydrocodone 10/325 per PCP instructions. Patient informed to pickup hardcopy at the front desk.

## 2015-03-16 ENCOUNTER — Encounter: Payer: Self-pay | Admitting: Family Medicine

## 2015-03-16 NOTE — Progress Notes (Signed)
Heather Roberts  Heather Roberts  [id number] 1946/09/25 03/16/2015      Progress Note-Follow Up  Subjective  Chief Complaint  Chief Complaint  Patient presents with  . Hospitalization Follow-up    HPI  Patient is a 69 y.o. female in today for routine medical care. Patient is in today for hospital follow-up. She presented to the emergency room with back pain and is continuing to have some neck pain and bilateral shoulder pain it is worse after lying still. No radicular symptoms down either arm. No recent falls or trauma. Denies CP/palp/SOB/HA/congestion/fevers/GI or GU c/o. Taking meds as prescribed  Past Medical History  Diagnosis Date  . History of DVT (deep vein thrombosis) 1994  . Hyperlipidemia   . Hypertension   . DJD (degenerative joint disease), lumbar   . Bouchard nodes (DJD hand)   . Allergic rhinitis   . Diabetes mellitus type II   . Anxiety   . Glaucoma   . Fibroids   . Palpitations 12/18/2012  . Nocturia 12/18/2012  . Anemia 03/19/2013  . Carotid artery disease 03/24/2014    Past Surgical History  Procedure Laterality Date  . Knee arthroscopy    . Cholecystectomy    . Abdominal hysterectomy    . Eye surgery  09/2014, 08/2014    Cataracts removal    Family History  Problem Relation Age of Onset  . Stroke    . Diabetes Paternal Grandmother   . Diabetes Mother   . Kidney disease Mother   . COPD Father     smoker  . Stroke Sister   . Breast cancer Sister     s/p mastectomy  . COPD Brother     History   Social History  . Marital Status: Married    Spouse Name: Essick  . Number of Children: 3  . Years of Education: N/A   Occupational History  . Homemaker     used to work for [employer]  . Smoking status: Former Smoker -- 0.30 packs/day for 8 years    Types: Cigarettes  . Smokeless tobacco: Never Used  . Alcohol Use: Yes     Comment: Rare  . Drug Use: No  . Sexual Activity:    Partners: Male   Comment: lives with husband, no dietary restrictions, minimizes dairy is walking more now   Other Topics Concern  . 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 aspirin 81 MG tablet Take 81 mg by mouth daily.      . Cholecalciferol (VITAMIN D) 1000 UNITS capsule Take 1,000 Units by mouth. ocassionally    . Cyanocobalamin (B-12) 1000 MCG CAPS Take 1 tablet by mouth daily.    . fluticasone (FLONASE) 50 MCG/ACT nasal spray Place 2 sprays into both nostrils daily as needed for allergies or rhinitis. 16 g 6  . losartan (COZAAR) 100 MG tablet TAKE 1 TABLET BY MOUTH EVERY DAY 30 tablet 0  . Probiotic Product (PROBIOTIC DAILY PO) Take by mouth as needed. Digestive Advantage Probiotic.    . Turmeric 500 MG CAPS Take 1 tablet by mouth daily.  No current facility-administered medications on file prior to visit.    No Known Allergies  Review of Systems  Review of Systems  Constitutional: Negative for fever and malaise/fatigue.  HENT: Negative for congestion.   Eyes: Negative for discharge.  Respiratory: Negative for shortness of breath.   Cardiovascular: Negative for chest pain, palpitations and leg swelling.  Gastrointestinal: Negative for nausea, abdominal pain and diarrhea.  Genitourinary: Negative for dysuria.  Musculoskeletal: Positive for back pain, joint pain and neck pain. Negative for falls.  Skin: Negative for rash.  Neurological: Negative for loss of consciousness and headaches.  Endo/Heme/Allergies: Negative for polydipsia.  Psychiatric/Behavioral: Negative for depression and suicidal ideas. The patient is not nervous/anxious and does not have insomnia.     Objective  BP 124/70 mmHg  Pulse 78  Temp(Src) 98.4 F (36.9 C) (Oral)  Ht 5'  1" (1.549 m)  Wt 184 lb (83.462 kg)  BMI 34.78 kg/m2  SpO2 98%  Physical Exam  Physical Exam  Constitutional: She is oriented to person, place, and time and well-developed, well-nourished, and in no distress. No distress.  HENT:  Head: Normocephalic and atraumatic.  Eyes: Conjunctivae are normal.  Neck: Neck supple. No thyromegaly present.  Spasm SCM on right>left  Cardiovascular: Normal rate, regular rhythm and normal heart sounds.   No murmur heard. Pulmonary/Chest: Effort normal and breath sounds normal. She has no wheezes.  Abdominal: She exhibits no distension and no mass.  Musculoskeletal: She exhibits no edema.  Lymphadenopathy:    She has no cervical adenopathy.  Neurological: She is alert and oriented to person, place, and time.  Skin: Skin is warm and dry. No rash noted. She is not diaphoretic.  Psychiatric: Memory, affect and judgment normal.    Lab Results  Component Value Date   TSH 1.01 01/21/2015   Lab Results  Component Value Date   WBC 6.1 01/21/2015   HGB 13.2 01/21/2015   HCT 39.6 01/21/2015   MCV 90.6 01/21/2015   PLT 290.0 01/21/2015   Lab Results  Component Value Date   CREATININE 0.83 01/21/2015   BUN 11 01/21/2015   NA 137 01/21/2015   K 4.1 01/21/2015   CL 100 01/21/2015   CO2 28 01/21/2015   Lab Results  Component Value Date   ALT 15 01/21/2015   AST 22 01/21/2015   ALKPHOS 114 01/21/2015   BILITOT 0.8 01/21/2015   Lab Results  Component Value Date   CHOL 165 01/21/2015   Lab Results  Component Value Date   HDL 66.00 01/21/2015   Lab Results  Component Value Date   LDLCALC 80 01/21/2015   Lab Results  Component Value Date   TRIG 97.0 01/21/2015   Lab Results  Component Value Date   CHOLHDL 3 01/21/2015     Assessment & Plan  Essential hypertension Well controlled, no changes to meds. Encouraged heart healthy diet such as the DASH diet and exercise as tolerated.    Diabetes mellitus type 2 in obese [id number]  acceptable, minimize simple carbs. Increase exercise as tolerated. Continue current meds   Hyperlipidemia associated with type 2 diabetes mellitus Tolerating statin, encouraged heart healthy diet, avoid trans fats, minimize simple carbs and saturated fats. Increase exercise as tolerated   Osteoarthritis of spine Neck pain and shoulder pain, Encouraged moist heat and gentle stretching as tolerated. May try NSAIDs and prescription meds as directed and report if symptoms worsen or seek immediate care. May try prescribed meds prn. Referred to orthopaedics.

## 2015-03-16 NOTE — Assessment & Plan Note (Signed)
Neck pain and shoulder pain, Encouraged moist heat and gentle stretching as tolerated. May try NSAIDs and prescription meds as directed and report if symptoms worsen or seek immediate care. May try prescribed meds prn. Referred to orthopaedics.

## 2015-03-16 NOTE — Assessment & Plan Note (Signed)
hgba1c acceptable, minimize simple carbs. Increase exercise as tolerated. Continue current meds 

## 2015-03-16 NOTE — Assessment & Plan Note (Signed)
Tolerating statin, encouraged heart healthy diet, avoid trans fats, minimize simple carbs and saturated fats. Increase exercise as tolerated 

## 2015-03-16 NOTE — Assessment & Plan Note (Signed)
Well controlled, no changes to meds. Encouraged heart healthy diet such as the DASH diet and exercise as tolerated.  °

## 2015-03-19 DIAGNOSIS — M25512 Pain in left shoulder: Secondary | ICD-10-CM | POA: Diagnosis not present

## 2015-03-19 DIAGNOSIS — M25511 Pain in right shoulder: Secondary | ICD-10-CM | POA: Diagnosis not present

## 2015-04-11 ENCOUNTER — Telehealth: Payer: Self-pay | Admitting: Family Medicine

## 2015-04-11 NOTE — Telephone Encounter (Signed)
Patient left message on VM on 6/24 stating that she was returning a call. I do not see where anyone called patient. Left message for patient to return my call.

## 2015-04-16 ENCOUNTER — Encounter: Payer: Self-pay | Admitting: Cardiology

## 2015-04-16 ENCOUNTER — Ambulatory Visit (INDEPENDENT_AMBULATORY_CARE_PROVIDER_SITE_OTHER): Payer: Medicare Other | Admitting: Cardiology

## 2015-04-16 VITALS — BP 147/80 | HR 72 | Ht 61.0 in | Wt 181.0 lb

## 2015-04-16 DIAGNOSIS — M25562 Pain in left knee: Secondary | ICD-10-CM

## 2015-04-16 DIAGNOSIS — E1169 Type 2 diabetes mellitus with other specified complication: Secondary | ICD-10-CM

## 2015-04-16 DIAGNOSIS — I10 Essential (primary) hypertension: Secondary | ICD-10-CM

## 2015-04-16 DIAGNOSIS — I679 Cerebrovascular disease, unspecified: Secondary | ICD-10-CM

## 2015-04-16 DIAGNOSIS — E785 Hyperlipidemia, unspecified: Secondary | ICD-10-CM

## 2015-04-16 MED ORDER — AMLODIPINE BESYLATE 10 MG PO TABS: 10.0000 mg | ORAL_TABLET | Freq: Every day | ORAL | Status: DC

## 2015-04-16 MED ORDER — ATORVASTATIN CALCIUM 20 MG PO TABS: 20.0000 mg | ORAL_TABLET | Freq: Every day | ORAL | Status: DC

## 2015-04-16 NOTE — Assessment & Plan Note (Signed)
Discontinue simvastatin given potential interaction with amlodipine. Begin Lipitor 20 mg daily. Check lipids and liver in 4 weeks.

## 2015-04-16 NOTE — Assessment & Plan Note (Signed)
Follow-up carotid Dopplers May 2017.

## 2015-04-16 NOTE — Progress Notes (Signed)
HPI: FU cerebrovascular disease, CP and palpitations. Stress echocardiogram in April of 2014 showed normal wall motion. There was a hypertensive response. Event monitor in April of 2014 showed sinus rhythm. CTA in March of 2014 showed no pulmonary embolus. TSH normal. Carotid Dopplers in May 2015 showed 1--39% bilateral stenosis. Followup recommended in 2 years. Since I last saw her, she has dyspnea with more extreme activities. No chest pain or syncope. She recently resumed walking and developed pain in her left lower extremity. It increases at night and improves with walking. No edema.  Current Outpatient Prescriptions  Medication Sig Dispense Refill  . amLODipine (NORVASC) 5 MG tablet Take 1 tablet (5 mg total) by mouth daily. 90 tablet 1  . aspirin 81 MG tablet Take 81 mg by mouth daily.      . Cholecalciferol (VITAMIN D) 1000 UNITS capsule Take 1,000 Units by mouth. ocassionally    . Cyanocobalamin (B-12) 1000 MCG CAPS Take 1 tablet by mouth daily.    . fluticasone (FLONASE) 50 MCG/ACT nasal spray Place 2 sprays into both nostrils daily as needed for allergies or rhinitis. 16 g 6  . HYDROcodone-acetaminophen (NORCO) 10-325 MG per tablet Take 1 to 2 tablets by mouth twice daily as needed for severe pain. 10 tablet 0  . losartan (COZAAR) 100 MG tablet TAKE 1 TABLET BY MOUTH EVERY DAY 30 tablet 0  . metFORMIN (GLUCOPHAGE) 500 MG tablet Take 1 tablet (500 mg total) by mouth 2 (two) times daily with a meal. 180 tablet 1  . methocarbamol (ROBAXIN) 500 MG tablet Take 1 tablet (500 mg total) by mouth every 8 (eight) hours as needed for muscle spasms. May cause sedation so start with bedtime use 30 tablet 1  . methylPREDNISolone (MEDROL) 4 MG tablet 6 tabs po daily x1 d then 5 tabs po daily x 1 d then 4 tabs po daily x 1 d then 3 tabs po x 1 d then 2 tabs po x 1 d then 1 tab po x 1 day and stop 21 tablet 1  . Probiotic Product (PROBIOTIC DAILY PO) Take by mouth as needed. Digestive Advantage  Probiotic.    . simvastatin (ZOCOR) 40 MG tablet Take 1 tablet (40 mg total) by mouth at bedtime. 30 tablet 6  . Turmeric 500 MG CAPS Take 1 tablet by mouth daily.     No current facility-administered medications for this visit.     Past Medical History  Diagnosis Date  . History of DVT (deep vein thrombosis) 1994  . Hyperlipidemia   . Hypertension   . DJD (degenerative joint disease), lumbar   . Bouchard nodes (DJD hand)   . Allergic rhinitis   . Diabetes mellitus type II   . Anxiety   . Glaucoma   . Fibroids   . Palpitations 12/18/2012  . Nocturia 12/18/2012  . Anemia 03/19/2013  . Carotid artery disease 03/24/2014    Past Surgical History  Procedure Laterality Date  . Knee arthroscopy    . Cholecystectomy    . Abdominal hysterectomy    . Eye surgery  09/2014, 08/2014    Cataracts removal    History   Social History  . Marital Status: Married    Spouse Name: Essick  . Number of Children: 3  . Years of Education: N/A   Occupational History  . Homemaker     used to work for [employer]  . Smoking status: Former Smoker --  0.30 packs/day for 8 years    Types: Cigarettes  . Smokeless tobacco: Never Used  . Alcohol Use: Yes     Comment: Rare  . Drug Use: No  . Sexual Activity:    Partners: Male     Comment: lives with husband, no dietary restrictions, minimizes dairy is walking more now   Other Topics Concern  . 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 Remaining systems are negative.  Physical Exam: Well-developed well-nourished in no acute distress.  Skin is warm  and dry.  HEENT is normal.  Neck is supple.  Chest is clear to auscultation with normal expansion.  Cardiovascular exam is regular rate and rhythm.  Abdominal exam nontender or distended. No masses palpated. Extremities show no edema. neuro grossly intact  ECG normal sinus rhythm at a rate of 72. No ST changes.

## 2015-04-16 NOTE — Assessment & Plan Note (Signed)
Blood pressure elevated. Increase amlodipine to 10 mg daily and follow.

## 2015-04-16 NOTE — Patient Instructions (Signed)
Your physician wants you to follow-up in: [place] will receive a reminder letter in the mail two months in advance. If you don't receive a letter, please call our office to schedule the follow-up appointment.   STOP SIMVASTATIN  START ATORVASTATIN 20 MG ONCE DAILY  INCREASE AMLODIPINE TO 10 MG ONCE DAILY= 2 OF THE 5 MG TABLETS ONCE DAILY  Your physician recommends that you return for lab work in: [place] ATORVASTATIN= DO NOT EAT PRIOR TO LAB WORK

## 2015-04-16 NOTE — Assessment & Plan Note (Signed)
Patient with complaints of pain in left lower extremity. She has 2+ dorsalis pedis pulse and symptoms not consistent with arterial insufficiency. No edema. Probable musculoskeletal pain.

## 2015-04-28 ENCOUNTER — Other Ambulatory Visit: Payer: Self-pay | Admitting: Family Medicine

## 2015-05-09 ENCOUNTER — Ambulatory Visit: Payer: Medicare Other | Admitting: Cardiology

## 2015-06-11 DIAGNOSIS — M25562 Pain in left knee: Secondary | ICD-10-CM | POA: Diagnosis not present

## 2015-06-11 DIAGNOSIS — M545 Low back pain: Secondary | ICD-10-CM | POA: Diagnosis not present

## 2015-06-11 DIAGNOSIS — M170 Bilateral primary osteoarthritis of knee: Secondary | ICD-10-CM | POA: Diagnosis not present

## 2015-06-11 DIAGNOSIS — M25561 Pain in right knee: Secondary | ICD-10-CM | POA: Diagnosis not present

## 2015-07-17 ENCOUNTER — Encounter: Payer: Self-pay | Admitting: *Deleted

## 2015-07-31 ENCOUNTER — Encounter: Payer: Self-pay | Admitting: Family Medicine

## 2015-07-31 ENCOUNTER — Other Ambulatory Visit (HOSPITAL_COMMUNITY)
Admission: RE | Admit: 2015-07-31 | Discharge: 2015-07-31 | Disposition: A | Discharge status: Home / Self Care | Payer: Medicare Other | Source: Ambulatory Visit | Attending: Family Medicine | Admitting: Family Medicine

## 2015-07-31 ENCOUNTER — Ambulatory Visit (INDEPENDENT_AMBULATORY_CARE_PROVIDER_SITE_OTHER): Payer: Medicare Other | Admitting: Family Medicine

## 2015-07-31 VITALS — BP 118/72 | HR 69 | Temp 97.8°F | Ht 61.0 in | Wt 181.2 lb

## 2015-07-31 DIAGNOSIS — IMO0002 Reserved for concepts with insufficient information to code with codable children: Secondary | ICD-10-CM

## 2015-07-31 DIAGNOSIS — E669 Obesity, unspecified: Secondary | ICD-10-CM

## 2015-07-31 DIAGNOSIS — M179 Osteoarthritis of knee, unspecified: Secondary | ICD-10-CM | POA: Diagnosis not present

## 2015-07-31 DIAGNOSIS — E785 Hyperlipidemia, unspecified: Secondary | ICD-10-CM

## 2015-07-31 DIAGNOSIS — J309 Allergic rhinitis, unspecified: Secondary | ICD-10-CM

## 2015-07-31 DIAGNOSIS — I10 Essential (primary) hypertension: Secondary | ICD-10-CM | POA: Diagnosis not present

## 2015-07-31 DIAGNOSIS — Z124 Encounter for screening for malignant neoplasm of cervix: Secondary | ICD-10-CM | POA: Insufficient documentation

## 2015-07-31 DIAGNOSIS — E1169 Type 2 diabetes mellitus with other specified complication: Secondary | ICD-10-CM | POA: Diagnosis not present

## 2015-07-31 DIAGNOSIS — Z23 Encounter for immunization: Secondary | ICD-10-CM

## 2015-07-31 DIAGNOSIS — M1710 Unilateral primary osteoarthritis, unspecified knee: Secondary | ICD-10-CM

## 2015-07-31 HISTORY — DX: Encounter for screening for malignant neoplasm of cervix: Z12.4

## 2015-07-31 HISTORY — DX: Obesity, unspecified: E66.9

## 2015-07-31 LAB — BASIC METABOLIC PANEL
BUN: 16 mg/dL (ref 6–23)
CALCIUM: 10.1 mg/dL (ref 8.4–10.5)
CO2: 27 mEq/L (ref 19–32)
CREATININE: 0.94 mg/dL (ref 0.40–1.20)
Chloride: 100 mEq/L (ref 96–112)
GFR: 75.91 mL/min (ref >60.00)
GLUCOSE: 99 mg/dL (ref 70–99)
Potassium: 3.6 mEq/L (ref 3.5–5.1)
SODIUM: 139 meq/L (ref 135–145)

## 2015-07-31 LAB — CBC
HCT: 40.8 % (ref 36.0–46.0)
Hemoglobin: 13.4 g/dL (ref 12.0–15.0)
MCHC: 32.9 g/dL (ref 30.0–36.0)
MCV: 92.4 fl (ref 78.0–100.0)
Platelets: 294 10*3/uL (ref 150.0–400.0)
RBC: 4.42 Mil/uL (ref 3.87–5.11)
RDW: 14.4 % (ref 11.5–15.5)
WBC: 7.2 10*3/uL (ref 4.0–10.5)

## 2015-07-31 LAB — HEMOGLOBIN A1C: Hgb A1c MFr Bld: 6.8 % — ABNORMAL HIGH (ref 4.6–6.5)

## 2015-07-31 LAB — TSH: TSH: 1.12 u[IU]/mL (ref 0.35–4.50)

## 2015-07-31 MED ORDER — GLUCOSE BLOOD VI STRP: ORAL_STRIP | Status: DC

## 2015-07-31 NOTE — Progress Notes (Signed)
Pre visit review using our clinic review tool, if applicable. No additional management support is needed unless otherwise documented below in the visit note. 

## 2015-07-31 NOTE — Patient Instructions (Signed)
Preventive Care for Adults, Female A healthy lifestyle and preventive care can promote health and wellness. Preventive health guidelines for women include the following key practices.  A routine yearly physical is a good way to check with your health care provider about your health and preventive screening. It is a chance to share any concerns and updates on your health and to receive a thorough exam.  Visit your dentist for a routine exam and preventive care every 6 months. Brush your teeth twice a day and floss once a day. Good oral hygiene prevents tooth decay and gum disease.  The frequency of eye exams is based on your age, health, family medical history, use of contact lenses, and other factors. Follow your health care provider's recommendations for frequency of eye exams.  Eat a healthy diet. Foods like vegetables, fruits, whole grains, low-fat dairy products, and lean protein foods contain the nutrients you need without too many calories. Decrease your intake of foods high in solid fats, added sugars, and salt. Eat the right amount of calories for you.Get information about a proper diet from your health care provider, if necessary.  Regular physical exercise is one of the most important things you can do for your health. Most adults should get at least 150 minutes of moderate-intensity exercise (any activity that increases your heart rate and causes you to sweat) each week. In addition, most adults need muscle-strengthening exercises on 2 or more days a week.  Maintain a healthy weight. The body mass index (BMI) is a screening tool to identify possible weight problems. It provides an estimate of body fat based on height and weight. Your health care provider can find your BMI and can help you achieve or maintain a healthy weight.For adults 20 years and older:  A BMI below 18.5 is considered underweight.  A BMI of 18.5 to 24.9 is normal.  A BMI of 25 to 29.9 is considered overweight.  A  BMI of 30 and above is considered obese.  Maintain normal blood lipids and cholesterol levels by exercising and minimizing your intake of saturated fat. Eat a balanced diet with plenty of fruit and vegetables. Blood tests for lipids and cholesterol should begin at age 45 and be repeated every 5 years. If your lipid or cholesterol levels are high, you are over 50, or you are at high risk for heart disease, you may need your cholesterol levels checked more frequently.Ongoing high lipid and cholesterol levels should be treated with medicines if diet and exercise are not working.  If you smoke, find out from your health care provider how to quit. If you do not use tobacco, do not start.  Lung cancer screening is recommended for adults aged 45-80 years who are at high risk for developing lung cancer because of a history of smoking. A yearly low-dose CT scan of the lungs is recommended for people who have at least a 30-pack-year history of smoking and are a current smoker or have quit within the past 15 years. A pack year of smoking is smoking an average of 1 pack of cigarettes a day for 1 year (for example: 1 pack a day for 30 years or 2 packs a day for 15 years). Yearly screening should continue until the smoker has stopped smoking for at least 15 years. Yearly screening should be stopped for people who develop a health problem that would prevent them from having lung cancer treatment.  If you are pregnant, do not drink alcohol. If you are  breastfeeding, be very cautious about drinking alcohol. If you are not pregnant and choose to drink alcohol, do not have more than 1 drink per day. One drink is considered to be 12 ounces (355 mL) of beer, 5 ounces (148 mL) of wine, or 1.5 ounces (44 mL) of liquor.  Avoid use of street drugs. Do not share needles with anyone. Ask for help if you need support or instructions about stopping the use of drugs.  High blood pressure causes heart disease and increases the risk  of stroke. Your blood pressure should be checked at least every 1 to 2 years. Ongoing high blood pressure should be treated with medicines if weight loss and exercise do not work.  If you are 55-79 years old, ask your health care provider if you should take aspirin to prevent strokes.  Diabetes screening is done by taking a blood sample to check your blood glucose level after you have not eaten for a certain period of time (fasting). If you are not overweight and you do not have risk factors for diabetes, you should be screened once every 3 years starting at age 45. If you are overweight or obese and you are 40-70 years of age, you should be screened for diabetes every year as part of your cardiovascular risk assessment.  Breast cancer screening is essential preventive care for women. You should practice "breast self-awareness." This means understanding the normal appearance and feel of your breasts and may include breast self-examination. Any changes detected, no matter how small, should be reported to a health care provider. Women in their 20s and 30s should have a clinical breast exam (CBE) by a health care provider as part of a regular health exam every 1 to 3 years. After age 40, women should have a CBE every year. Starting at age 40, women should consider having a mammogram (breast X-ray test) every year. Women who have a family history of breast cancer should talk to their health care provider about genetic screening. Women at a high risk of breast cancer should talk to their health care providers about having an MRI and a mammogram every year.  Breast cancer gene (BRCA)-related cancer risk assessment is recommended for women who have family members with BRCA-related cancers. BRCA-related cancers include breast, ovarian, tubal, and peritoneal cancers. Having family members with these cancers may be associated with an increased risk for harmful changes (mutations) in the breast cancer genes BRCA1 and  BRCA2. Results of the assessment will determine the need for genetic counseling and BRCA1 and BRCA2 testing.  Your health care provider may recommend that you be screened regularly for cancer of the pelvic organs (ovaries, uterus, and vagina). This screening involves a pelvic examination, including checking for microscopic changes to the surface of your cervix (Pap test). You may be encouraged to have this screening done every 3 years, beginning at age 21.  For women ages 30-65, health care providers may recommend pelvic exams and Pap testing every 3 years, or they may recommend the Pap and pelvic exam, combined with testing for human papilloma virus (HPV), every 5 years. Some types of HPV increase your risk of cervical cancer. Testing for HPV may also be done on women of any age with unclear Pap test results.  Other health care providers may not recommend any screening for nonpregnant women who are considered low risk for pelvic cancer and who do not have symptoms. Ask your health care provider if a screening pelvic exam is right for   you.  If you have had past treatment for cervical cancer or a condition that could lead to cancer, you need Pap tests and screening for cancer for at least 20 years after your treatment. If Pap tests have been discontinued, your risk factors (such as having a new sexual partner) need to be reassessed to determine if screening should resume. Some women have medical problems that increase the chance of getting cervical cancer. In these cases, your health care provider may recommend more frequent screening and Pap tests.  Colorectal cancer can be detected and often prevented. Most routine colorectal cancer screening begins at the age of 50 years and continues through age 75 years. However, your health care provider may recommend screening at an earlier age if you have risk factors for colon cancer. On a yearly basis, your health care provider may provide home test kits to check  for hidden blood in the stool. Use of a small camera at the end of a tube, to directly examine the colon (sigmoidoscopy or colonoscopy), can detect the earliest forms of colorectal cancer. Talk to your health care provider about this at age 50, when routine screening begins. Direct exam of the colon should be repeated every 5-10 years through age 75 years, unless early forms of precancerous polyps or small growths are found.  People who are at an increased risk for hepatitis B should be screened for this virus. You are considered at high risk for hepatitis B if:  You were born in a country where hepatitis B occurs often. Talk with your health care provider about which countries are considered high risk.  Your parents were born in a high-risk country and you have not received a shot to protect against hepatitis B (hepatitis B vaccine).  You have HIV or AIDS.  You use needles to inject street drugs.  You live with, or have sex with, someone who has hepatitis B.  You get hemodialysis treatment.  You take certain medicines for conditions like cancer, organ transplantation, and autoimmune conditions.  Hepatitis C blood testing is recommended for all people born from 1945 through 1965 and any individual with known risks for hepatitis C.  Practice safe sex. Use condoms and avoid high-risk sexual practices to reduce the spread of sexually transmitted infections (STIs). STIs include gonorrhea, chlamydia, syphilis, trichomonas, herpes, HPV, and human immunodeficiency virus (HIV). Herpes, HIV, and HPV are viral illnesses that have no cure. They can result in disability, cancer, and death.  You should be screened for sexually transmitted illnesses (STIs) including gonorrhea and chlamydia if:  You are sexually active and are younger than 24 years.  You are older than 24 years and your health care provider tells you that you are at risk for this type of infection.  Your sexual activity has changed  since you were last screened and you are at an increased risk for chlamydia or gonorrhea. Ask your health care provider if you are at risk.  If you are at risk of being infected with HIV, it is recommended that you take a prescription medicine daily to prevent HIV infection. This is called preexposure prophylaxis (PrEP). You are considered at risk if:  You are sexually active and do not regularly use condoms or know the HIV status of your partner(s).  You take drugs by injection.  You are sexually active with a partner who has HIV.  Talk with your health care provider about whether you are at high risk of being infected with HIV. If   you choose to begin PrEP, you should first be tested for HIV. You should then be tested every 3 months for as long as you are taking PrEP.  Osteoporosis is a disease in which the bones lose minerals and strength with aging. This can result in serious bone fractures or breaks. The risk of osteoporosis can be identified using a bone density scan. Women ages 67 years and over and women at risk for fractures or osteoporosis should discuss screening with their health care providers. Ask your health care provider whether you should take a calcium supplement or vitamin D to reduce the rate of osteoporosis.  Menopause can be associated with physical symptoms and risks. Hormone replacement therapy is available to decrease symptoms and risks. You should talk to your health care provider about whether hormone replacement therapy is right for you.  Use sunscreen. Apply sunscreen liberally and repeatedly throughout the day. You should seek shade when your shadow is shorter than you. Protect yourself by wearing long sleeves, pants, a wide-brimmed hat, and sunglasses year round, whenever you are outdoors.  Once a month, do a whole body skin exam, using a mirror to look at the skin on your back. Tell your health care provider of new moles, moles that have irregular borders, moles that  are larger than a pencil eraser, or moles that have changed in shape or color.  Stay current with required vaccines (immunizations).  Influenza vaccine. All adults should be immunized every year.  Tetanus, diphtheria, and acellular pertussis (Td, Tdap) vaccine. Pregnant women should receive 1 dose of Tdap vaccine during each pregnancy. The dose should be obtained regardless of the length of time since the last dose. Immunization is preferred during the 27th-36th week of gestation. An adult who has not previously received Tdap or who does not know her vaccine status should receive 1 dose of Tdap. This initial dose should be followed by tetanus and diphtheria toxoids (Td) booster doses every 10 years. Adults with an unknown or incomplete history of completing a 3-dose immunization series with Td-containing vaccines should begin or complete a primary immunization series including a Tdap dose. Adults should receive a Td booster every 10 years.  Varicella vaccine. An adult without evidence of immunity to varicella should receive 2 doses or a second dose if she has previously received 1 dose. Pregnant females who do not have evidence of immunity should receive the first dose after pregnancy. This first dose should be obtained before leaving the health care facility. The second dose should be obtained 4-8 weeks after the first dose.  Human papillomavirus (HPV) vaccine. Females aged 13-26 years who have not received the vaccine previously should obtain the 3-dose series. The vaccine is not recommended for use in pregnant females. However, pregnancy testing is not needed before receiving a dose. If a female is found to be pregnant after receiving a dose, no treatment is needed. In that case, the remaining doses should be delayed until after the pregnancy. Immunization is recommended for any person with an immunocompromised condition through the age of 61 years if she did not get any or all doses earlier. During the  3-dose series, the second dose should be obtained 4-8 weeks after the first dose. The third dose should be obtained 24 weeks after the first dose and 16 weeks after the second dose.  Zoster vaccine. One dose is recommended for adults aged 30 years or older unless certain conditions are present.  Measles, mumps, and rubella (MMR) vaccine. Adults born  before 1957 generally are considered immune to measles and mumps. Adults born in 1957 or later should have 1 or more doses of MMR vaccine unless there is a contraindication to the vaccine or there is laboratory evidence of immunity to each of the three diseases. A routine second dose of MMR vaccine should be obtained at least 28 days after the first dose for students attending postsecondary schools, health care workers, or international travelers. People who received inactivated measles vaccine or an unknown type of measles vaccine during 1963-1967 should receive 2 doses of MMR vaccine. People who received inactivated mumps vaccine or an unknown type of mumps vaccine before 1979 and are at high risk for mumps infection should consider immunization with 2 doses of MMR vaccine. For females of childbearing age, rubella immunity should be determined. If there is no evidence of immunity, females who are not pregnant should be vaccinated. If there is no evidence of immunity, females who are pregnant should delay immunization until after pregnancy. Unvaccinated health care workers born before 1957 who lack laboratory evidence of measles, mumps, or rubella immunity or laboratory confirmation of disease should consider measles and mumps immunization with 2 doses of MMR vaccine or rubella immunization with 1 dose of MMR vaccine.  Pneumococcal 13-valent conjugate (PCV13) vaccine. When indicated, a person who is uncertain of his immunization history and has no record of immunization should receive the PCV13 vaccine. All adults 65 years of age and older should receive this  vaccine. An adult aged 19 years or older who has certain medical conditions and has not been previously immunized should receive 1 dose of PCV13 vaccine. This PCV13 should be followed with a dose of pneumococcal polysaccharide (PPSV23) vaccine. Adults who are at high risk for pneumococcal disease should obtain the PPSV23 vaccine at least 8 weeks after the dose of PCV13 vaccine. Adults older than 69 years of age who have normal immune system function should obtain the PPSV23 vaccine dose at least 1 year after the dose of PCV13 vaccine.  Pneumococcal polysaccharide (PPSV23) vaccine. When PCV13 is also indicated, PCV13 should be obtained first. All adults aged 65 years and older should be immunized. An adult younger than age 65 years who has certain medical conditions should be immunized. Any person who resides in a nursing home or long-term care facility should be immunized. An adult smoker should be immunized. People with an immunocompromised condition and certain other conditions should receive both PCV13 and PPSV23 vaccines. People with human immunodeficiency virus (HIV) infection should be immunized as soon as possible after diagnosis. Immunization during chemotherapy or radiation therapy should be avoided. Routine use of PPSV23 vaccine is not recommended for American Indians, [place] Natives, or people younger than 65 years unless there are medical conditions that require PPSV23 vaccine. When indicated, people who have unknown immunization and have no record of immunization should receive PPSV23 vaccine. One-time revaccination 5 years after the first dose of PPSV23 is recommended for people aged 19-64 years who have chronic kidney failure, nephrotic syndrome, asplenia, or immunocompromised conditions. People who received 1-2 doses of PPSV23 before age 65 years should receive another dose of PPSV23 vaccine at age 65 years or later if at least 5 years have passed since the previous dose. Doses of PPSV23 are not  needed for people immunized with PPSV23 at or after age 65 years.  Meningococcal vaccine. Adults with asplenia or persistent complement component deficiencies should receive 2 doses of quadrivalent meningococcal conjugate (MenACWY-D) vaccine. The doses should be obtained   at least 2 months apart. Microbiologists working with certain meningococcal bacteria, [place] recruits, people at risk during an outbreak, and people who travel to or live in countries with a high rate of meningitis should be immunized. A first-year college student up through age 34 years who is living in a residence hall should receive a dose if she did not receive a dose on or after her 16th birthday. Adults who have certain high-risk conditions should receive one or more doses of vaccine.  Hepatitis A vaccine. Adults who wish to be protected from this disease, have certain high-risk conditions, work with hepatitis A-infected animals, work in hepatitis A research labs, or travel to or work in countries with a high rate of hepatitis A should be immunized. Adults who were previously unvaccinated and who anticipate close contact with an international adoptee during the first 60 days after arrival in the [place] from a country with a high rate of hepatitis A should be immunized.  Hepatitis B vaccine. Adults who wish to be protected from this disease, have certain high-risk conditions, may be exposed to blood or other infectious body fluids, are household contacts or sex partners of hepatitis B positive people, are clients or workers in certain care facilities, or travel to or work in countries with a high rate of hepatitis B should be immunized.  Haemophilus influenzae type b (Hib) vaccine. A previously unvaccinated person with asplenia or sickle cell disease or having a scheduled splenectomy should receive 1 dose of Hib vaccine. Regardless of previous immunization, a recipient of a hematopoietic stem cell transplant should receive a  3-dose series 6-12 months after her successful transplant. Hib vaccine is not recommended for adults with HIV infection. Preventive Services / Frequency Ages 35 to 4 years  Blood pressure check.** / Every 3-5 years.  Lipid and cholesterol check.** / Every 5 years beginning at age 60.  Clinical breast exam.** / Every 3 years for women in their 71s and 10s.  BRCA-related cancer risk assessment.** / For women who have family members with a BRCA-related cancer (breast, ovarian, tubal, or peritoneal cancers).  Pap test.** / Every 2 years from ages 76 through 26. Every 3 years starting at age 61 through age 76 or 93 with a history of 3 consecutive normal Pap tests.  HPV screening.** / Every 3 years from ages 37 through ages 60 to 51 with a history of 3 consecutive normal Pap tests.  Hepatitis C blood test.** / For any individual with known risks for hepatitis C.  Skin self-exam. / Monthly.  Influenza vaccine. / Every year.  Tetanus, diphtheria, and acellular pertussis (Tdap, Td) vaccine.** / Consult your health care provider. Pregnant women should receive 1 dose of Tdap vaccine during each pregnancy. 1 dose of Td every 10 years.  Varicella vaccine.** / Consult your health care provider. Pregnant females who do not have evidence of immunity should receive the first dose after pregnancy.  HPV vaccine. / 3 doses over 6 months, if 93 and younger. The vaccine is not recommended for use in pregnant females. However, pregnancy testing is not needed before receiving a dose.  Measles, mumps, rubella (MMR) vaccine.** / You need at least 1 dose of MMR if you were born in 1957 or later. You may also need a 2nd dose. For females of childbearing age, rubella immunity should be determined. If there is no evidence of immunity, females who are not pregnant should be vaccinated. If there is no evidence of immunity, females who are  pregnant should delay immunization until after pregnancy.  Pneumococcal  13-valent conjugate (PCV13) vaccine.** / Consult your health care provider.  Pneumococcal polysaccharide (PPSV23) vaccine.** / 1 to 2 doses if you smoke cigarettes or if you have certain conditions.  Meningococcal vaccine.** / 1 dose if you are age 68 to 8 years and a Market researcher living in a residence hall, or have one of several medical conditions, you need to get vaccinated against meningococcal disease. You may also need additional booster doses.  Hepatitis A vaccine.** / Consult your health care provider.  Hepatitis B vaccine.** / Consult your health care provider.  Haemophilus influenzae type b (Hib) vaccine.** / Consult your health care provider. Ages 7 to 53 years  Blood pressure check.** / Every year.  Lipid and cholesterol check.** / Every 5 years beginning at age 25 years.  Lung cancer screening. / Every year if you are aged 11-80 years and have a 30-pack-year history of smoking and currently smoke or have quit within the past 15 years. Yearly screening is stopped once you have quit smoking for at least 15 years or develop a health problem that would prevent you from having lung cancer treatment.  Clinical breast exam.** / Every year after age 48 years.  BRCA-related cancer risk assessment.** / For women who have family members with a BRCA-related cancer (breast, ovarian, tubal, or peritoneal cancers).  Mammogram.** / Every year beginning at age 41 years and continuing for as long as you are in good health. Consult with your health care provider.  Pap test.** / Every 3 years starting at age 65 years through age 37 or 70 years with a history of 3 consecutive normal Pap tests.  HPV screening.** / Every 3 years from ages 72 years through ages 60 to 40 years with a history of 3 consecutive normal Pap tests.  Fecal occult blood test (FOBT) of stool. / Every year beginning at age 21 years and continuing until age 5 years. You may not need to do this test if you get  a colonoscopy every 10 years.  Flexible sigmoidoscopy or colonoscopy.** / Every 5 years for a flexible sigmoidoscopy or every 10 years for a colonoscopy beginning at age 35 years and continuing until age 48 years.  Hepatitis C blood test.** / For all people born from [birth year] through 1965 and any individual with known risks for hepatitis C.  Skin self-exam. / Monthly.  Influenza vaccine. / Every year.  Tetanus, diphtheria, and acellular pertussis (Tdap/Td) vaccine.** / Consult your health care provider. Pregnant women should receive 1 dose of Tdap vaccine during each pregnancy. 1 dose of Td every 10 years.  Varicella vaccine.** / Consult your health care provider. Pregnant females who do not have evidence of immunity should receive the first dose after pregnancy.  Zoster vaccine.** / 1 dose for adults aged 30 years or older.  Measles, mumps, rubella (MMR) vaccine.** / You need at least 1 dose of MMR if you were born in 1957 or later. You may also need a second dose. For females of childbearing age, rubella immunity should be determined. If there is no evidence of immunity, females who are not pregnant should be vaccinated. If there is no evidence of immunity, females who are pregnant should delay immunization until after pregnancy.  Pneumococcal 13-valent conjugate (PCV13) vaccine.** / Consult your health care provider.  Pneumococcal polysaccharide (PPSV23) vaccine.** / 1 to 2 doses if you smoke cigarettes or if you have certain conditions.  Meningococcal vaccine.** /  Consult your health care provider.  Hepatitis A vaccine.** / Consult your health care provider.  Hepatitis B vaccine.** / Consult your health care provider.  Haemophilus influenzae type b (Hib) vaccine.** / Consult your health care provider. Ages 64 years and over  Blood pressure check.** / Every year.  Lipid and cholesterol check.** / Every 5 years beginning at age 23 years.  Lung cancer screening. / Every year if you  are aged 16-80 years and have a 30-pack-year history of smoking and currently smoke or have quit within the past 15 years. Yearly screening is stopped once you have quit smoking for at least 15 years or develop a health problem that would prevent you from having lung cancer treatment.  Clinical breast exam.** / Every year after age 74 years.  BRCA-related cancer risk assessment.** / For women who have family members with a BRCA-related cancer (breast, ovarian, tubal, or peritoneal cancers).  Mammogram.** / Every year beginning at age 44 years and continuing for as long as you are in good health. Consult with your health care provider.  Pap test.** / Every 3 years starting at age 58 years through age 22 or 39 years with 3 consecutive normal Pap tests. Testing can be stopped between 65 and 70 years with 3 consecutive normal Pap tests and no abnormal Pap or HPV tests in the past 10 years.  HPV screening.** / Every 3 years from ages 64 years through ages 70 or 61 years with a history of 3 consecutive normal Pap tests. Testing can be stopped between 65 and 70 years with 3 consecutive normal Pap tests and no abnormal Pap or HPV tests in the past 10 years.  Fecal occult blood test (FOBT) of stool. / Every year beginning at age 40 years and continuing until age 27 years. You may not need to do this test if you get a colonoscopy every 10 years.  Flexible sigmoidoscopy or colonoscopy.** / Every 5 years for a flexible sigmoidoscopy or every 10 years for a colonoscopy beginning at age 7 years and continuing until age 32 years.  Hepatitis C blood test.** / For all people born from [birth year] through 1965 and any individual with known risks for hepatitis C.  Osteoporosis screening.** / A one-time screening for women ages 30 years and over and women at risk for fractures or osteoporosis.  Skin self-exam. / Monthly.  Influenza vaccine. / Every year.  Tetanus, diphtheria, and acellular pertussis (Tdap/Td)  vaccine.** / 1 dose of Td every 10 years.  Varicella vaccine.** / Consult your health care provider.  Zoster vaccine.** / 1 dose for adults aged 35 years or older.  Pneumococcal 13-valent conjugate (PCV13) vaccine.** / Consult your health care provider.  Pneumococcal polysaccharide (PPSV23) vaccine.** / 1 dose for all adults aged 46 years and older.  Meningococcal vaccine.** / Consult your health care provider.  Hepatitis A vaccine.** / Consult your health care provider.  Hepatitis B vaccine.** / Consult your health care provider.  Haemophilus influenzae type b (Hib) vaccine.** / Consult your health care provider. ** Family history and personal history of risk and conditions may change your health care provider's recommendations.   This information is not intended to replace advice given to you by your health care provider. Make sure you discuss any questions you have with your health care provider.   Document Released: 11/30/2001 Document Revised: 10/25/2014 Document Reviewed: 03/01/2011 Elsevier Interactive Patient Education [employer].

## 2015-08-01 ENCOUNTER — Encounter: Payer: Self-pay | Admitting: *Deleted

## 2015-08-01 LAB — HEPATIC FUNCTION PANEL
ALT: 18 U/L (ref 6–29)
AST: 26 U/L (ref 10–35)
Albumin: 4.4 g/dL (ref 3.6–5.1)
Alkaline Phosphatase: 115 U/L (ref 33–130)
BILIRUBIN INDIRECT: 0.8 mg/dL (ref 0.2–1.2)
BILIRUBIN TOTAL: 1 mg/dL (ref 0.2–1.2)
Bilirubin, Direct: 0.2 mg/dL (ref <=0.2)
Total Protein: 7.7 g/dL (ref 6.1–8.1)

## 2015-08-01 LAB — LIPID PANEL
CHOL/HDL RATIO: 2.1 ratio (ref <=5.0)
CHOLESTEROL: 152 mg/dL (ref 125–200)
HDL: 73 mg/dL (ref >=46)
LDL Cholesterol: 59 mg/dL (ref <130)
Triglycerides: 101 mg/dL (ref <150)
VLDL: 20 mg/dL (ref <30)

## 2015-08-01 LAB — CYTOLOGY - PAP

## 2015-08-10 NOTE — Assessment & Plan Note (Signed)
Well controlled, no changes to meds. Encouraged heart healthy diet such as the DASH diet and exercise as tolerated.  °

## 2015-08-10 NOTE — Assessment & Plan Note (Signed)
Tolerating statin, encouraged heart healthy diet, avoid trans fats, minimize simple carbs and saturated fats. Increase exercise as tolerated. Given flu shot

## 2015-08-10 NOTE — Assessment & Plan Note (Signed)
Follows with Dr Patience Musca of ortho in [place]. May try topical treaments

## 2015-08-10 NOTE — Assessment & Plan Note (Signed)
Set up with new meter. hgba1c acceptable, minimize simple carbs. Increase exercise as tolerated. Continue current meds

## 2015-08-10 NOTE — Progress Notes (Signed)
Subjective:    Patient ID: Heather Roberts, female    DOB: 03-12-1946, 69 y.o.   MRN: [id number]  Chief Complaint  Patient presents with  . Follow-up    6 month    HPI Patient is in today for follow-up. Is generally doing well. Does note has had some increased nasal congestion and postnasal drip recently but no fevers or chills. Denies polyuria or polydipsia. Fasting sugar this morning was 119. Continues to struggle with arthritic pains has some notable knee pain and shoulder pain. Is following with orthopedics in [facility]. No recent trauma or injury. Denies CP/palp/SOB/HA/congestion/fevers/GI or GU c/o. Taking meds as prescribed  Past Medical History  Diagnosis Date  . History of DVT (deep vein thrombosis) 1994  . Hyperlipidemia   . Hypertension   . DJD (degenerative joint disease), lumbar   . Bouchard nodes (DJD hand)   . Allergic rhinitis   . Diabetes mellitus type II   . Anxiety   . Glaucoma   . Fibroids   . Palpitations 12/18/2012  . Nocturia 12/18/2012  . Anemia 03/19/2013  . Carotid artery disease ([place]) 03/24/2014  . Cervical cancer screening 07/31/2015  . Obesity 07/31/2015    Past Surgical History  Procedure Laterality Date  . Knee arthroscopy    . Cholecystectomy    . Abdominal hysterectomy    . Eye surgery  09/2014, 08/2014    Cataracts removal    Family History  Problem Relation Age of Onset  . Stroke    . Diabetes Paternal Grandmother   . Diabetes Mother   . Kidney disease Mother   . COPD Father     smoker  . Stroke Sister   . Breast cancer Sister     s/p mastectomy  . COPD Brother     Social History   Social History  . Marital Status: Married    Spouse Name: Essick  . Number of Children: 3  . Years of Education: N/A   Occupational History  . Homemaker     used to work for [employer]  . Smoking status: Former Smoker -- 0.30 packs/day for 8 years    Types: Cigarettes  . Smokeless tobacco:  Never Used  . Alcohol Use: Yes     Comment: Rare  . Drug Use: No  . Sexual Activity:    Partners: Male     Comment: lives with husband, no dietary restrictions, minimizes dairy is walking more now   Other Topics Concern  . 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 amLODipine (NORVASC) 10 MG tablet Take 1 tablet (10 mg total) by mouth daily. 90 tablet 3  . aspirin 81 MG tablet Take 81 mg by mouth daily.      . Cholecalciferol (VITAMIN D) 1000 UNITS capsule Take 1,000 Units by mouth. ocassionally    . Cyanocobalamin (B-12) 1000 MCG CAPS Take 1 tablet by mouth daily.    . fluticasone (FLONASE) 50 MCG/ACT nasal spray Place 2 sprays into both nostrils daily as needed for allergies or rhinitis. 16 g 6  . losartan (COZAAR) 100 MG tablet TAKE 1 TABLET  BY MOUTH EVERY DAY 30 tablet 0  . metFORMIN (GLUCOPHAGE) 500 MG tablet TAKE 1 TABLET BY MOUTH TWICE DAILY WITH MEALS 180 tablet 3  . Probiotic Product (PROBIOTIC DAILY PO) Take by mouth as needed. Digestive Advantage Probiotic.    . Turmeric 500 MG CAPS Take 1 tablet by mouth daily.    Marland Kitchen atorvastatin (LIPITOR) 20 MG tablet Take 1 tablet (20 mg total) by mouth daily. (Patient not taking: Reported on 07/31/2015) 90 tablet 3  . HYDROcodone-acetaminophen (NORCO) 10-325 MG per tablet Take 1 to 2 tablets by mouth twice daily as needed for severe pain. 10 tablet 0  . methocarbamol (ROBAXIN) 500 MG tablet Take 1 tablet (500 mg total) by mouth every 8 (eight) hours as needed for muscle spasms. May cause sedation so start with bedtime use 30 tablet 1  . methylPREDNISolone (MEDROL) 4 MG tablet 6 tabs po daily x1 d then 5 tabs po daily x 1 d then 4 tabs po daily x 1 d then 3 tabs po x 1 d then 2 tabs po x 1 d then 1 tab po x 1 day and stop  21 tablet 1   No facility-administered medications prior to visit.    No Known Allergies  Review of Systems  Constitutional: Negative for fever and malaise/fatigue.  HENT: Negative for congestion.   Eyes: Negative for discharge.  Respiratory: Negative for shortness of breath.   Cardiovascular: Negative for chest pain, palpitations and leg swelling.  Gastrointestinal: Negative for nausea and abdominal pain.  Genitourinary: Negative for dysuria.  Musculoskeletal: Negative for falls.  Skin: Negative for rash.  Neurological: Negative for loss of consciousness and headaches.  Endo/Heme/Allergies: Negative for environmental allergies.  Psychiatric/Behavioral: Negative for depression. The patient is not nervous/anxious.        Objective:    Physical Exam  Constitutional: She is oriented to person, place, and time. She appears well-developed and well-nourished. No distress.  HENT:  Head: Normocephalic and atraumatic.  Nose: Nose normal.  Eyes: Right eye exhibits no discharge. Left eye exhibits no discharge.  Neck: Normal range of motion. Neck supple.  Cardiovascular: Normal rate and regular rhythm.   No murmur heard. Pulmonary/Chest: Effort normal and breath sounds normal.  Abdominal: Soft. Bowel sounds are normal. There is no tenderness.  Musculoskeletal: She exhibits no edema.  Neurological: She is alert and oriented to person, place, and time.  Skin: Skin is warm and dry.  Psychiatric: She has a normal mood and affect.  Nursing note and vitals reviewed.   BP 118/72 mmHg  Pulse 69  Temp(Src) 97.8 F (36.6 C) (Oral)  Ht 5\' 1"  (1.549 m)  Wt 181 lb 4 oz (82.214 kg)  BMI 34.26 kg/m2  SpO2 96% Wt Readings from Last 3 Encounters:  07/31/15 181 lb 4 oz (82.214 kg)  04/16/15 181 lb (82.101 kg)  03/07/15 184 lb (83.462 kg)     Lab Results  Component Value Date   WBC 7.2 07/31/2015   HGB 13.4 07/31/2015   HCT 40.8 07/31/2015   PLT 294.0 07/31/2015   GLUCOSE 99  07/31/2015   CHOL 152 07/31/2015   TRIG 101 07/31/2015   HDL 73 07/31/2015   LDLDIRECT 118.5 10/04/2007   LDLCALC 59 07/31/2015   ALT 18 07/31/2015   AST 26 07/31/2015   NA 139 07/31/2015   K 3.6 07/31/2015   CL 100 07/31/2015   CREATININE 0.94 07/31/2015   BUN 16 07/31/2015   CO2 27 07/31/2015   TSH 1.12 07/31/2015  HGBA1C 6.8* 07/31/2015   MICROALBUR 0.50 06/14/2012    Lab Results  Component Value Date   TSH 1.12 07/31/2015   Lab Results  Component Value Date   WBC 7.2 07/31/2015   HGB 13.4 07/31/2015   HCT 40.8 07/31/2015   MCV 92.4 07/31/2015   PLT 294.0 07/31/2015   Lab Results  Component Value Date   NA 139 07/31/2015   K 3.6 07/31/2015   CO2 27 07/31/2015   GLUCOSE 99 07/31/2015   BUN 16 07/31/2015   CREATININE 0.94 07/31/2015   BILITOT 1.0 07/31/2015   ALKPHOS 115 07/31/2015   AST 26 07/31/2015   ALT 18 07/31/2015   PROT 7.7 07/31/2015   ALBUMIN 4.4 07/31/2015   CALCIUM 10.1 07/31/2015   GFR 75.91 07/31/2015   Lab Results  Component Value Date   CHOL 152 07/31/2015   Lab Results  Component Value Date   HDL 73 07/31/2015   Lab Results  Component Value Date   LDLCALC 59 07/31/2015   Lab Results  Component Value Date   TRIG 101 07/31/2015   Lab Results  Component Value Date   CHOLHDL 2.1 07/31/2015   Lab Results  Component Value Date   HGBA1C 6.8* 07/31/2015       Assessment & Plan:   Problem List Items Addressed This Visit    Osteoarthrosis, unspecified whether generalized or localized, involving lower leg    Follows with Dr Patience Musca of ortho in [facility]. May try topical treaments      Obesity   Hyperlipidemia associated with type 2 diabetes mellitus ([place])    Tolerating statin, encouraged heart healthy diet, avoid trans fats, minimize simple carbs and saturated fats. Increase exercise as tolerated. Given flu shot      Relevant Orders   TSH (Completed)   CBC (Completed)   Hemoglobin A1c (Completed)   Basic Metabolic Panel  (BMET) (Completed)   Essential hypertension    Well controlled, no changes to meds. Encouraged heart healthy diet such as the DASH diet and exercise as tolerated.       Relevant Orders   TSH (Completed)   CBC (Completed)   Hemoglobin A1c (Completed)   Basic Metabolic Panel (BMET) (Completed)   Diabetes mellitus type 2 in obese ([place])    Set up with new meter. hgba1c acceptable, minimize simple carbs. Increase exercise as tolerated. Continue current meds      Relevant Orders   TSH (Completed)   CBC (Completed)   Hemoglobin A1c (Completed)   Basic Metabolic Panel (BMET) (Completed)   Cervical cancer screening   Relevant Orders   TSH (Completed)   CBC (Completed)   Hemoglobin A1c (Completed)   Basic Metabolic Panel (BMET) (Completed)   Cytology - PAP (Completed)   Allergic rhinitis    Encouraged daily antihistamine, flonase and nasal saline       Other Visit Diagnoses    Encounter for immunization    -  Primary       I have discontinued Ms. Watkins Williams's methylPREDNISolone, methocarbamol, and HYDROcodone-acetaminophen. I have changed her Glucose Blood (GLUCOSE METER TEST VI) to glucose blood (GLUCOSE METER TEST) test strip. I have also changed her glucose blood. Additionally, I am having her maintain her aspirin, Vitamin D, Probiotic Product (PROBIOTIC DAILY PO), fluticasone, losartan, Turmeric, B-12, atorvastatin, amLODipine, and metFORMIN.  Meds ordered this encounter  Medications  . DISCONTD: glucose blood (ONETOUCH VERIO) test strip    Sig: Use as instructed  . DISCONTD: Glucose Blood (GLUCOSE METER TEST  VI)    Sig: To use with onetouch verio flex DX E11.9  . glucose blood (GLUCOSE METER TEST) test strip    Sig: To use with onetouch verio flex DX E11.9    Dispense:  100 each    Refill:  6  . glucose blood (ONETOUCH VERIO) test strip    Sig: Check blood sugar once daily.  DX E11.9    Dispense:  100 each    Refill:  6     [name], MD

## 2015-08-10 NOTE — Assessment & Plan Note (Signed)
Encouraged daily antihistamine, flonase and nasal saline

## 2015-09-16 ENCOUNTER — Telehealth: Payer: Self-pay | Admitting: *Deleted

## 2015-09-16 NOTE — Telephone Encounter (Signed)
Form filled out and forwarded to Dr. Blyth for signature. JG//CMA  

## 2015-09-18 NOTE — Telephone Encounter (Signed)
Completed/signed form faxed to [facility] at [phone] successfully. Sent for scanning. JG//CMA

## 2015-09-19 ENCOUNTER — Telehealth: Payer: Self-pay | Admitting: Family Medicine

## 2015-09-19 NOTE — Telephone Encounter (Signed)
Pt states she has a very dry throat every night and makes it hard to swallow. She is asking for advise on what to take or do to help prevent it.

## 2015-09-19 NOTE — Telephone Encounter (Signed)
Have her get some glycerin swabs from the pharmacy or some elderberry liquid to take 1 tsp before bed. Both are OTC, also just needs to drink plenty of fluids every day to stay hydrated

## 2015-09-19 NOTE — Telephone Encounter (Signed)
Patient informed of PCP instructions. 

## 2015-09-29 NOTE — Progress Notes (Signed)
HPI: FU cerebrovascular disease, CP and palpitations. Stress echocardiogram in April of 2014 showed normal wall motion. There was a hypertensive response. Event monitor in April of 2014 showed sinus rhythm. CTA in March of 2014 showed no pulmonary embolus. TSH normal. Carotid Dopplers in May 2015 showed 1--39% bilateral stenosis. Followup recommended in 2 years. Since I last saw her, the patient denies any dyspnea on exertion, orthopnea, PND, pedal edema, palpitations, syncope or chest pain.   Current Outpatient Prescriptions  Medication Sig Dispense Refill  . amLODipine (NORVASC) 10 MG tablet Take 1 tablet (10 mg total) by mouth daily. 90 tablet 3  . aspirin 81 MG tablet Take 81 mg by mouth daily.      Marland Kitchen atorvastatin (LIPITOR) 20 MG tablet Take 1 tablet (20 mg total) by mouth daily. 90 tablet 3  . Chlorphen-Phenyleph-APAP (CORICIDIN D COLD/FLU/SINUS) 2-5-325 MG TABS Take 1 tablet by mouth as directed.    . Cholecalciferol (VITAMIN D) 1000 UNITS capsule Take 1,000 Units by mouth. ocassionally    . Cyanocobalamin (B-12) 1000 MCG CAPS Take 1 tablet by mouth daily.    Marland Kitchen ELDERBERRY PO Take by mouth.    . fluticasone (FLONASE) 50 MCG/ACT nasal spray Place 2 sprays into both nostrils daily as needed for allergies or rhinitis. 16 g 6  . glucose blood (GLUCOSE METER TEST) test strip To use with onetouch verio flex DX E11.9 100 each 6  . glucose blood (ONETOUCH VERIO) test strip Check blood sugar once daily.  DX E11.9 100 each 6  . losartan (COZAAR) 100 MG tablet TAKE 1 TABLET BY MOUTH EVERY DAY 30 tablet 0  . metFORMIN (GLUCOPHAGE) 500 MG tablet TAKE 1 TABLET BY MOUTH TWICE DAILY WITH MEALS 180 tablet 3  . mometasone (NASONEX) 50 MCG/ACT nasal spray Place 2 sprays into the nose daily.    . nitroGLYCERIN (NITROSTAT) 0.4 MG SL tablet Place 0.4 mg under the tongue every 5 (five) minutes as needed for chest pain.    . simvastatin (ZOCOR) 40 MG tablet Take 40 mg by mouth at bedtime as needed and may  repeat dose one time if needed.    . Turmeric 500 MG CAPS Take 1 tablet by mouth daily.     No current facility-administered medications for this visit.     Past Medical History  Diagnosis Date  . History of DVT (deep vein thrombosis) 1994  . Hyperlipidemia   . Hypertension   . DJD (degenerative joint disease), lumbar   . Bouchard nodes (DJD hand)   . Allergic rhinitis   . Diabetes mellitus type II   . Anxiety   . Glaucoma   . Fibroids   . Palpitations 12/18/2012  . Nocturia 12/18/2012  . Anemia 03/19/2013  . Carotid artery disease ([place]) 03/24/2014  . Cervical cancer screening 07/31/2015  . Obesity 07/31/2015    Past Surgical History  Procedure Laterality Date  . Knee arthroscopy    . Cholecystectomy    . Abdominal hysterectomy    . Eye surgery  09/2014, 08/2014    Cataracts removal    Social History   Social History  . Marital Status: Married    Spouse Name: Essick  . Number of Children: 3  . Years of Education: N/A   Occupational History  . Homemaker     used to work for [employer]  . Smoking status: Former Smoker -- 0.30 packs/day for 8 years    Types: Cigarettes  .  Smokeless tobacco: Never Used  . Alcohol Use: Yes     Comment: Rare  . Drug Use: No  . Sexual Activity:    Partners: Male     Comment: lives with husband, no dietary restrictions, minimizes dairy is walking more now   Other Topics Concern  . 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 Remaining systems are negative.  Physical Exam: Well-developed well-nourished in no acute distress.  Skin is warm and dry.  HEENT is  normal.  Neck is supple.  Chest is clear to auscultation with normal expansion.  Cardiovascular exam is regular rate and rhythm.  Abdominal exam nontender or distended. No masses palpated. Extremities show no edema. neuro grossly intact  ECG Sinus rhythm at a rate of 69. No ST changes.

## 2015-10-01 ENCOUNTER — Ambulatory Visit (INDEPENDENT_AMBULATORY_CARE_PROVIDER_SITE_OTHER): Payer: Medicare Other | Admitting: Cardiology

## 2015-10-01 ENCOUNTER — Encounter: Payer: Self-pay | Admitting: Cardiology

## 2015-10-01 ENCOUNTER — Other Ambulatory Visit: Payer: Self-pay | Admitting: Cardiology

## 2015-10-01 VITALS — BP 169/84 | HR 69 | Ht 61.0 in | Wt 180.4 lb

## 2015-10-01 DIAGNOSIS — I10 Essential (primary) hypertension: Secondary | ICD-10-CM | POA: Diagnosis not present

## 2015-10-01 MED ORDER — METOPROLOL SUCCINATE ER 50 MG PO TB24: 50.0000 mg | ORAL_TABLET | Freq: Every day | ORAL | Status: DC

## 2015-10-01 NOTE — Assessment & Plan Note (Signed)
Blood pressure elevated. Add Toprol 50 mg daily and follow.

## 2015-10-01 NOTE — Patient Instructions (Addendum)
Medication Instructions:  STOP Simvastatin (Zocor) START Metoprolol Succinate (ToprolXL) 50 mg - take 1 tablet by mouth daily  >>A new prescription has been sent to the pharmacy electronically.  Labwork: NONE  Testing/Procedures: NONE  Follow-Up: Dr Stanford Breed recommends that you schedule a follow-up appointment in 1 year. You will receive a reminder letter in the mail two months in advance. If you don't receive a letter, please call our office to schedule the follow-up appointment.  If you need a refill on your cardiac medications before your next appointment, please call your pharmacy.

## 2015-10-01 NOTE — Assessment & Plan Note (Signed)
Patient is taking two statins. I have asked her to discontinue Zocor And continue Lipitor.

## 2015-10-01 NOTE — Assessment & Plan Note (Signed)
Schedule follow-up carotid Dopplers May 2017.

## 2015-10-01 NOTE — Telephone Encounter (Signed)
Rx has been sent to the pharmacy electronically. ° °

## 2015-10-24 ENCOUNTER — Telehealth: Payer: Self-pay | Admitting: *Deleted

## 2015-10-24 NOTE — Telephone Encounter (Signed)
Received Order for [facility] from [facility], phoned patient to verify oif order was Valid; Patient strongly denied requesting order and stated that she has been called about this prior; informed pt hat I would placed note in her EMR, so that we would be aware and have a reference to go back to when/if more paperwork comes in/SLS

## 2015-12-01 ENCOUNTER — Ambulatory Visit: Payer: Medicare Other

## 2015-12-01 ENCOUNTER — Telehealth: Payer: Self-pay | Admitting: Behavioral Health

## 2015-12-01 NOTE — Telephone Encounter (Signed)
Appointment confirmed for Medicare Wellness visit on 12/02/15 at 8:45 AM.

## 2015-12-02 ENCOUNTER — Ambulatory Visit: Payer: Medicare Other

## 2015-12-02 VITALS — BP 140/66 | HR 65 | Ht 62.5 in | Wt 184.0 lb

## 2015-12-02 DIAGNOSIS — Z0000 Encounter for general adult medical examination without abnormal findings: Secondary | ICD-10-CM

## 2015-12-02 DIAGNOSIS — Z1159 Encounter for screening for other viral diseases: Secondary | ICD-10-CM | POA: Diagnosis not present

## 2015-12-02 DIAGNOSIS — E669 Obesity, unspecified: Secondary | ICD-10-CM

## 2015-12-02 DIAGNOSIS — E119 Type 2 diabetes mellitus without complications: Secondary | ICD-10-CM | POA: Diagnosis not present

## 2015-12-02 DIAGNOSIS — E1169 Type 2 diabetes mellitus with other specified complication: Secondary | ICD-10-CM

## 2015-12-02 NOTE — Patient Instructions (Addendum)
Bring in a copy of Advanced Directive/Living Will at next visit.    Eat heart healthy diet (full of fruits, vegetables, whole grains, lean protein, water--limit salt, fat, and sugar intake) and increase physical activity as tolerated.  Continue doing brain stimulating activities (puzzles, reading, adult coloring books, staying active) to keep memory sharp.   Keep appt for mammogram.  Schedule colonoscopy be the end of the year.    Follow up with Dr. Charlett Blake as scheduled.    Complete Hep C screening at next visit.    Colonoscopy A colonoscopy is an exam to look at the entire large intestine (colon). This exam can help find problems such as tumors, polyps, inflammation, and areas of bleeding. The exam takes about 1 hour.  LET [facility] CARE PROVIDER KNOW ABOUT:   Any allergies you have.  All medicines you are taking, including vitamins, herbs, eye drops, creams, and over-the-counter medicines.  Previous problems you or members of your family have had with the use of anesthetics.  Any blood disorders you have.  Previous surgeries you have had.  Medical conditions you have. RISKS AND COMPLICATIONS  Generally, this is a safe procedure. However, as with any procedure, complications can occur. Possible complications include:  Bleeding.  Tearing or rupture of the colon wall.  Reaction to medicines given during the exam.  Infection (rare). BEFORE THE PROCEDURE   Ask your health care provider about changing or stopping your regular medicines.  You may be prescribed an oral bowel prep. This involves drinking a large amount of medicated liquid, starting the day before your procedure. The liquid will cause you to have multiple loose stools until your stool is almost clear or light green. This cleans out your colon in preparation for the procedure.  Do not eat or drink anything else once you have started the bowel prep, unless your health care provider tells you it is safe to do  so.  Arrange for someone to drive you home after the procedure. PROCEDURE   You will be given medicine to help you relax (sedative).  You will lie on your side with your knees bent.  A long, flexible tube with a light and camera on the end (colonoscope) will be inserted through the rectum and into the colon. The camera sends video back to a computer screen as it moves through the colon. The colonoscope also releases carbon dioxide gas to inflate the colon. This helps your health care provider see the area better.  During the exam, your health care provider may take a small tissue sample (biopsy) to be examined under a microscope if any abnormalities are found.  The exam is finished when the entire colon has been viewed. AFTER THE PROCEDURE   Do not drive for 24 hours after the exam.  You may have a small amount of blood in your stool.  You may pass moderate amounts of gas and have mild abdominal cramping or bloating. This is caused by the gas used to inflate your colon during the exam.  Ask when your test results will be ready and how you will get your results. Make sure you get your test results.   This information is not intended to replace advice given to you by your health care provider. Make sure you discuss any questions you have with your health care provider.   Document Released: 10/01/2000 Document Revised: 07/25/2013 Document Reviewed: 06/11/2013 Elsevier Interactive Patient Education 2016 [place].  Diabetes and Foot Care Diabetes may cause you  to have problems because of poor blood supply (circulation) to your feet and legs. This may cause the skin on your feet to become thinner, break easier, and heal more slowly. Your skin may become dry, and the skin may peel and crack. You may also have nerve damage in your legs and feet causing decreased feeling in them. You may not notice minor injuries to your feet that could lead to infections or more serious problems. Taking  care of your feet is one of the most important things you can do for yourself.  HOME CARE INSTRUCTIONS  Wear shoes at all times, even in the house. Do not go barefoot. Bare feet are easily injured.  Check your feet daily for blisters, cuts, and redness. If you cannot see the bottom of your feet, use a mirror or ask someone for help.  Wash your feet with warm water (do not use hot water) and mild soap. Then pat your feet and the areas between your toes until they are completely dry. Do not soak your feet as this can dry your skin.  Apply a moisturizing lotion or petroleum jelly (that does not contain alcohol and is unscented) to the skin on your feet and to dry, brittle toenails. Do not apply lotion between your toes.  Trim your toenails straight across. Do not dig under them or around the cuticle. File the edges of your nails with an emery board or nail file.  Do not cut corns or calluses or try to remove them with medicine.  Wear clean socks or stockings every day. Make sure they are not too tight. Do not wear knee-high stockings since they may decrease blood flow to your legs.  Wear shoes that fit properly and have enough cushioning. To break in new shoes, wear them for just a few hours a day. This prevents you from injuring your feet. Always look in your shoes before you put them on to be sure there are no objects inside.  Do not cross your legs. This may decrease the blood flow to your feet.  If you find a minor scrape, cut, or break in the skin on your feet, keep it and the skin around it clean and dry. These areas may be cleansed with mild soap and water. Do not cleanse the area with peroxide, alcohol, or iodine.  When you remove an adhesive bandage, be sure not to damage the skin around it.  If you have a wound, look at it several times a day to make sure it is healing.  Do not use heating pads or hot water bottles. They may burn your skin. If you have lost feeling in your feet or  legs, you may not know it is happening until it is too late.  Make sure your health care provider performs a complete foot exam at least annually or more often if you have foot problems. Report any cuts, sores, or bruises to your health care provider immediately. SEEK MEDICAL CARE IF:   You have an injury that is not healing.  You have cuts or breaks in the skin.  You have an ingrown nail.  You notice redness on your legs or feet.  You feel burning or tingling in your legs or feet.  You have pain or cramps in your legs and feet.  Your legs or feet are numb.  Your feet always feel cold. SEEK IMMEDIATE MEDICAL CARE IF:   There is increasing redness, swelling, or pain in or around a  wound.  There is a red line that goes up your leg.  Pus is coming from a wound.  You develop a fever or as directed by your health care provider.  You notice a bad smell coming from an ulcer or wound.   This information is not intended to replace advice given to you by your health care provider. Make sure you discuss any questions you have with your health care provider.   Document Released: 10/01/2000 Document Revised: 06/06/2013 Document Reviewed: 03/13/2013 Elsevier Interactive Patient Education 2016 [place] Maintenance, Female Adopting a healthy lifestyle and getting preventive care can go a long way to promote health and wellness. Talk with your health care provider about what schedule of regular examinations is right for you. This is a good chance for you to check in with your provider about disease prevention and staying healthy. In between checkups, there are plenty of things you can do on your own. Experts have done a lot of research about which lifestyle changes and preventive measures are most likely to keep you healthy. Ask your health care provider for more information. WEIGHT AND DIET  Eat a healthy diet  Be sure to include plenty of vegetables, fruits, low-fat dairy  products, and lean protein.  Do not eat a lot of foods high in solid fats, added sugars, or salt.  Get regular exercise. This is one of the most important things you can do for your health.  Most adults should exercise for at least 150 minutes each week. The exercise should increase your heart rate and make you sweat (moderate-intensity exercise).  Most adults should also do strengthening exercises at least twice a week. This is in addition to the moderate-intensity exercise.  Maintain a healthy weight  Body mass index (BMI) is a measurement that can be used to identify possible weight problems. It estimates body fat based on height and weight. Your health care provider can help determine your BMI and help you achieve or maintain a healthy weight.  For females 22 years of age and older:   A BMI below 18.5 is considered underweight.  A BMI of 18.5 to 24.9 is normal.  A BMI of 25 to 29.9 is considered overweight.  A BMI of 30 and above is considered obese.  Watch levels of cholesterol and blood lipids  You should start having your blood tested for lipids and cholesterol at 70 years of age, then have this test every 5 years.  You may need to have your cholesterol levels checked more often if:  Your lipid or cholesterol levels are high.  You are older than 70 years of age.  You are at high risk for heart disease.  CANCER SCREENING   Lung Cancer  Lung cancer screening is recommended for adults 37-60 years old who are at high risk for lung cancer because of a history of smoking.  A yearly low-dose CT scan of the lungs is recommended for people who:  Currently smoke.  Have quit within the past 15 years.  Have at least a 30-pack-year history of smoking. A pack year is smoking an average of one pack of cigarettes a day for 1 year.  Yearly screening should continue until it has been 15 years since you quit.  Yearly screening should stop if you develop a health problem that  would prevent you from having lung cancer treatment.  Breast Cancer  Practice breast self-awareness. This means understanding how your breasts normally appear and feel.  It also means doing regular breast self-exams. Let your health care provider know about any changes, no matter how small.  If you are in your 20s or 30s, you should have a clinical breast exam (CBE) by a health care provider every 1-3 years as part of a regular health exam.  If you are 5 or older, have a CBE every year. Also consider having a breast X-ray (mammogram) every year.  If you have a family history of breast cancer, talk to your health care provider about genetic screening.  If you are at high risk for breast cancer, talk to your health care provider about having an MRI and a mammogram every year.  Breast cancer gene (BRCA) assessment is recommended for women who have family members with BRCA-related cancers. BRCA-related cancers include:  Breast.  Ovarian.  Tubal.  Peritoneal cancers.  Results of the assessment will determine the need for genetic counseling and BRCA1 and BRCA2 testing. Cervical Cancer Your health care provider may recommend that you be screened regularly for cancer of the pelvic organs (ovaries, uterus, and vagina). This screening involves a pelvic examination, including checking for microscopic changes to the surface of your cervix (Pap test). You may be encouraged to have this screening done every 3 years, beginning at age 63.  For women ages 16-65, health care providers may recommend pelvic exams and Pap testing every 3 years, or they may recommend the Pap and pelvic exam, combined with testing for human papilloma virus (HPV), every 5 years. Some types of HPV increase your risk of cervical cancer. Testing for HPV may also be done on women of any age with unclear Pap test results.  Other health care providers may not recommend any screening for nonpregnant women who are considered low  risk for pelvic cancer and who do not have symptoms. Ask your health care provider if a screening pelvic exam is right for you.  If you have had past treatment for cervical cancer or a condition that could lead to cancer, you need Pap tests and screening for cancer for at least 20 years after your treatment. If Pap tests have been discontinued, your risk factors (such as having a new sexual partner) need to be reassessed to determine if screening should resume. Some women have medical problems that increase the chance of getting cervical cancer. In these cases, your health care provider may recommend more frequent screening and Pap tests. Colorectal Cancer  This type of cancer can be detected and often prevented.  Routine colorectal cancer screening usually begins at 70 years of age and continues through 70 years of age.  Your health care provider may recommend screening at an earlier age if you have risk factors for colon cancer.  Your health care provider may also recommend using home test kits to check for hidden blood in the stool.  A small camera at the end of a tube can be used to examine your colon directly (sigmoidoscopy or colonoscopy). This is done to check for the earliest forms of colorectal cancer.  Routine screening usually begins at age 69.  Direct examination of the colon should be repeated every 5-10 years through 70 years of age. However, you may need to be screened more often if early forms of precancerous polyps or small growths are found. Skin Cancer  Check your skin from head to toe regularly.  Tell your health care provider about any new moles or changes in moles, especially if there is a change in a mole's  shape or color.  Also tell your health care provider if you have a mole that is larger than the size of a pencil eraser.  Always use sunscreen. Apply sunscreen liberally and repeatedly throughout the day.  Protect yourself by wearing long sleeves, pants, a  wide-brimmed hat, and sunglasses whenever you are outside. HEART DISEASE, DIABETES, AND HIGH BLOOD PRESSURE   High blood pressure causes heart disease and increases the risk of stroke. High blood pressure is more likely to develop in:  People who have blood pressure in the high end of the normal range (130-139/85-89 mm Hg).  People who are overweight or obese.  People who are African American.  If you are 38-26 years of age, have your blood pressure checked every 3-5 years. If you are 46 years of age or older, have your blood pressure checked every year. You should have your blood pressure measured twice--once when you are at a hospital or clinic, and once when you are not at a hospital or clinic. Record the average of the two measurements. To check your blood pressure when you are not at a hospital or clinic, you can use:  An automated blood pressure machine at a pharmacy.  A home blood pressure monitor.  If you are between 64 years and 46 years old, ask your health care provider if you should take aspirin to prevent strokes.  Have regular diabetes screenings. This involves taking a blood sample to check your fasting blood sugar level.  If you are at a normal weight and have a low risk for diabetes, have this test once every three years after 70 years of age.  If you are overweight and have a high risk for diabetes, consider being tested at a younger age or more often. PREVENTING INFECTION  Hepatitis B  If you have a higher risk for hepatitis B, you should be screened for this virus. You are considered at high risk for hepatitis B if:  You were born in a country where hepatitis B is common. Ask your health care provider which countries are considered high risk.  Your parents were born in a high-risk country, and you have not been immunized against hepatitis B (hepatitis B vaccine).  You have HIV or AIDS.  You use needles to inject street drugs.  You live with someone who has  hepatitis B.  You have had sex with someone who has hepatitis B.  You get hemodialysis treatment.  You take certain medicines for conditions, including cancer, organ transplantation, and autoimmune conditions. Hepatitis C  Blood testing is recommended for:  Everyone born from [birth year] through 1965.  Anyone with known risk factors for hepatitis C. Sexually transmitted infections (STIs)  You should be screened for sexually transmitted infections (STIs) including gonorrhea and chlamydia if:  You are sexually active and are younger than 70 years of age.  You are older than 70 years of age and your health care provider tells you that you are at risk for this type of infection.  Your sexual activity has changed since you were last screened and you are at an increased risk for chlamydia or gonorrhea. Ask your health care provider if you are at risk.  If you do not have HIV, but are at risk, it may be recommended that you take a prescription medicine daily to prevent HIV infection. This is called pre-exposure prophylaxis (PrEP). You are considered at risk if:  You are sexually active and do not regularly use condoms or know the  HIV status of your partner(s).  You take drugs by injection.  You are sexually active with a partner who has HIV. Talk with your health care provider about whether you are at high risk of being infected with HIV. If you choose to begin PrEP, you should first be tested for HIV. You should then be tested every 3 months for as long as you are taking PrEP.  PREGNANCY   If you are premenopausal and you may become pregnant, ask your health care provider about preconception counseling.  If you may become pregnant, take 400 to 800 micrograms (mcg) of folic acid every day.  If you want to prevent pregnancy, talk to your health care provider about birth control (contraception). OSTEOPOROSIS AND MENOPAUSE   Osteoporosis is a disease in which the bones lose minerals and  strength with aging. This can result in serious bone fractures. Your risk for osteoporosis can be identified using a bone density scan.  If you are 79 years of age or older, or if you are at risk for osteoporosis and fractures, ask your health care provider if you should be screened.  Ask your health care provider whether you should take a calcium or vitamin D supplement to lower your risk for osteoporosis.  Menopause may have certain physical symptoms and risks.  Hormone replacement therapy may reduce some of these symptoms and risks. Talk to your health care provider about whether hormone replacement therapy is right for you.  HOME CARE INSTRUCTIONS   Schedule regular health, dental, and eye exams.  Stay current with your immunizations.   Do not use any tobacco products including cigarettes, chewing tobacco, or electronic cigarettes.  If you are pregnant, do not drink alcohol.  If you are breastfeeding, limit how much and how often you drink alcohol.  Limit alcohol intake to no more than 1 drink per day for nonpregnant women. One drink equals 12 ounces of beer, 5 ounces of wine, or 1 ounces of hard liquor.  Do not use street drugs.  Do not share needles.  Ask your health care provider for help if you need support or information about quitting drugs.  Tell your health care provider if you often feel depressed.  Tell your health care provider if you have ever been abused or do not feel safe at home.   This information is not intended to replace advice given to you by your health care provider. Make sure you discuss any questions you have with your health care provider.   Document Released: 04/19/2011 Document Revised: 10/25/2014 Document Reviewed: 09/05/2013 Elsevier Interactive Patient Education [employer].

## 2015-12-02 NOTE — Progress Notes (Signed)
Subjective:   Heather Roberts is a 70 y.o. female who presents for Medicare Annual (Subsequent) preventive examination.  Review of Systems:  NO ROS Cardiac Risk Factors include: advanced age (>35men, >82 women);obesity (BMI >30kg/m2);hypertension;diabetes mellitus Sleep patterns:   Sleeps at least 7 hours per night/occasionally wakes up during the night to void.   Home Safety/Smoke Alarms:  Feels safe at home.  Lives with husband two story home.  Smokes alarms present.  Carbon monoxide detector present.   Firearm Safety:  Kept in a safe place.   Seat Belt Safety/Bike Helmet:  Always wears seat belt.    Counseling:   Eye Exam- 10/22/14--Dr. Leeroy Bock; has an appt scheduled for 5/17. Dental- Upper and lowere dentures.   Female:  Pap-07/31/15-negative     Mammo-12/07/13; has one scheduled for 12/15/15      Dexa scan-11/10/11-normal       CCS-11/11/05-normal Dr Norwood Levo; discussed-pt plans to schedule by the end of this year.    Immunization:  Prevnar 13- declined; Tetanus/TDAP-declined    Objective:     Vitals: BP 140/66 mmHg  Pulse 65  Ht 5' 2.5" (1.588 m)  Wt 184 lb (83.462 kg)  BMI 33.10 kg/m2  SpO2 97%  Tobacco History  Smoking status  . Former Smoker -- 0.30 packs/day for 8 years  . Types: Cigarettes  Smokeless tobacco  . Never Used     Counseling given: No   Past Medical History  Diagnosis Date  . History of DVT (deep vein thrombosis) 1994  . Hyperlipidemia   . Hypertension   . DJD (degenerative joint disease), lumbar   . Bouchard nodes (DJD hand)   . Allergic rhinitis   . Diabetes mellitus type II   . Anxiety   . Glaucoma   . Fibroids   . Palpitations 12/18/2012  . Nocturia 12/18/2012  . Anemia 03/19/2013  . Carotid artery disease ([place]) 03/24/2014  . Cervical cancer screening 07/31/2015  . Obesity 07/31/2015   Past Surgical History  Procedure Laterality Date  . Knee arthroscopy    . Cholecystectomy    . Abdominal hysterectomy    . Eye surgery   09/2014, 08/2014    Cataracts removal   Family History  Problem Relation Age of Onset  . Stroke    . Diabetes Paternal Grandmother   . Diabetes Mother   . Kidney disease Mother   . COPD Father     smoker  . Stroke Sister   . Breast cancer Sister     s/p mastectomy  . COPD Brother    History  Sexual Activity  . Sexual Activity:  . Partners: Male    Comment: lives with husband, no dietary restrictions, minimizes dairy is walking more now    Outpatient Encounter Prescriptions as of 12/02/2015  Medication Sig  . amLODipine (NORVASC) 10 MG tablet Take 1 tablet (10 mg total) by mouth daily.  Marland Kitchen aspirin 81 MG tablet Take 81 mg by mouth daily.    Marland Kitchen atorvastatin (LIPITOR) 20 MG tablet Take 1 tablet (20 mg total) by mouth daily.  . Chlorphen-Phenyleph-APAP (CORICIDIN D COLD/FLU/SINUS) 2-5-325 MG TABS Take 1 tablet by mouth as directed.  . Cholecalciferol (VITAMIN D) 1000 UNITS capsule Take 1,000 Units by mouth. ocassionally  . Cyanocobalamin (B-12) 1000 MCG CAPS Take 1 tablet by mouth daily.  . fluticasone (FLONASE) 50 MCG/ACT nasal spray Place 2 sprays into both nostrils daily as needed for allergies or rhinitis.  Marland Kitchen glucose blood (GLUCOSE METER TEST) test strip To  use with onetouch verio flex DX E11.9  . glucose blood (ONETOUCH VERIO) test strip Check blood sugar once daily.  DX E11.9  . losartan (COZAAR) 100 MG tablet TAKE 1 TABLET BY MOUTH EVERY DAY  . metFORMIN (GLUCOPHAGE) 500 MG tablet TAKE 1 TABLET BY MOUTH TWICE DAILY WITH MEALS  . metoprolol succinate (TOPROL-XL) 50 MG 24 hr tablet TAKE 1 TABLET BY MOUTH DAILY. TAKE WITH OR IMMEDIATELY FOLLOWING A MEAL  . mometasone (NASONEX) 50 MCG/ACT nasal spray Place 2 sprays into the nose daily.  . nitroGLYCERIN (NITROSTAT) 0.4 MG SL tablet Place 0.4 mg under the tongue every 5 (five) minutes as needed for chest pain.  . Turmeric 500 MG CAPS Take 1 tablet by mouth daily.  . [DISCONTINUED] ELDERBERRY PO Take by mouth. Reported on 12/02/2015     No facility-administered encounter medications on file as of 12/02/2015.    Activities of Daily Living In your present state of health, do you have any difficulty performing the following activities: 12/02/2015 01/21/2015  Hearing? N N  Vision? N N  Difficulty concentrating or making decisions? N N  Walking or climbing stairs? N N  Dressing or bathing? N N  Doing errands, shopping? N N  Preparing Food and eating ? N -  Using the Toilet? N -  In the past six months, have you accidently leaked urine? N -  Do you have problems with loss of bowel control? N -  Managing your Medications? N -  Managing your Finances? N -  Housekeeping or managing your Housekeeping? N -    Patient Care Team: Mosie Lukes, MD as PCP - General (Family Medicine) Lelon Perla, MD as Consulting Physician (Cardiology)  Dr. Leane Platt    Assessment:  Physical assessment deferred to PCP.    Exercise Activities and Dietary recommendations Current Exercise Habits:: The patient does not participate in regular exercise at present (Stays active around the house and walks 5 blocks to visit a friend 2-3 times per week. )   Diet:  Regular diet.  Eats 3-4 meals per day.  Most eats cooked meals.  Tries to limit simple carb intake.  Occasionally goes out to eat.    Goals    . Schedule colonoscopy by the end of the year.       Fall Risk Fall Risk  12/02/2015 01/21/2015  Falls in the past year? No No   Depression Screen PHQ 2/9 Scores 12/02/2015 01/21/2015  PHQ - 2 Score 0 0  Exception Documentation - Patient refusal     Cognitive Testing MMSE - Mini Mental State Exam 12/02/2015  Orientation to time 5  Orientation to Place 5  Registration 3  Attention/ Calculation 5  Recall 3  Language- name 2 objects 2  Language- repeat 1  Language- follow 3 step command 3  Language- read & follow direction 1  Write a sentence 1  Copy design 1  Total score 30    Immunization History  Administered Date(s)  Administered  . Influenza Split 06/23/2012  . Influenza Whole 08/09/2008, 07/31/2009, 06/19/2010  . Influenza,inj,Quad PF,36+ Mos 06/25/2013, 07/22/2014, 07/31/2015  . Pneumococcal Polysaccharide-23 08/06/2011  . Td 12/16/2004  . Zoster 01/03/2007   Screening Tests Health Maintenance  Topic Date Due  . Hepatitis C Screening  07/26/1946  . PNA vac Low Risk Adult (2 of 2 - PCV13) 08/05/2012  . TETANUS/TDAP  12/17/2014  . COLONOSCOPY  11/12/2015  . OPHTHALMOLOGY EXAM  11/02/2015  . MAMMOGRAM  12/08/2015  . HEMOGLOBIN  A1C  01/29/2016  . INFLUENZA VACCINE  05/18/2016  . FOOT EXAM  12/01/2016  . DEXA SCAN  Completed  . ZOSTAVAX  Completed      Plan:  Bring in a copy of Advanced Directive/Living Will at next visit.    Eat heart healthy diet (full of fruits, vegetables, whole grains, lean protein, water--limit salt, fat, and sugar intake) and increase physical activity as tolerated.  Continue doing brain stimulating activities (puzzles, reading, adult coloring books, staying active) to keep memory sharp.   Keep appt for mammogram.  Schedule colonoscopy be the end of the year.    Follow up with Dr. Charlett Blake as scheduled.    Complete Hep C screening at next visit.     During the course of the visit the patient was educated and counseled about the following appropriate screening and preventive services:   Vaccines to include Pneumoccal, Influenza, Hepatitis B, Td, Zostavax, HCV  Electrocardiogram  Cardiovascular Disease  Colorectal cancer screening  Bone density screening  Diabetes screening  Glaucoma screening  Mammography/PAP  Nutrition counseling   Patient Instructions (the written plan) was given to the patient.   Rudene Anda, RN  12/02/2015

## 2015-12-02 NOTE — Assessment & Plan Note (Signed)
Managed with oral antidiabetic medication.  A1C acceptable.  Fast blood sugar this morning:119.  Encouraged to limit simple carbs and increase physical activity.  Followed by Dr. Charlett Blake.

## 2015-12-02 NOTE — Progress Notes (Signed)
RN AWV reviewed. Agree with documention and plan. 

## 2015-12-02 NOTE — Progress Notes (Signed)
Pre visit review using our clinic review tool, if applicable. No additional management support is needed unless otherwise documented below in the visit note. 

## 2015-12-15 DIAGNOSIS — Z1231 Encounter for screening mammogram for malignant neoplasm of breast: Secondary | ICD-10-CM | POA: Diagnosis not present

## 2015-12-15 LAB — HM MAMMOGRAPHY

## 2015-12-19 ENCOUNTER — Encounter: Payer: Self-pay | Admitting: Family Medicine

## 2015-12-22 ENCOUNTER — Telehealth: Payer: Self-pay | Admitting: *Deleted

## 2015-12-22 ENCOUNTER — Telehealth: Payer: Self-pay | Admitting: Family Medicine

## 2015-12-22 NOTE — Telephone Encounter (Signed)
Received Mammogram results which recommends the patient have additional diagnostic imaging; signed by provider and faxed/SLS 03/06

## 2015-12-22 NOTE — Telephone Encounter (Signed)
PCP informed the left breast was positive for assymetry.  PCP informed the left breast looks different than the right and additonal imaging to confirm if ok or not.   Informed the patient of PCP's response.

## 2015-12-22 NOTE — Telephone Encounter (Signed)
Caller name: Lorn Junes Relation to pt: self Call back [phone] Pharmacy:  Reason for call: Pt called stating wanting to talk to Dr. Charlett Blake about needing a second ultrasound and a mammogram since pt had one done from last Monday (December 15, 2015) pt is really worry about her results and would like to be called ASAP.

## 2015-12-22 NOTE — Telephone Encounter (Signed)
The patient is calling to get her mammogram results.  She knows they have found something as an U/S has been ordered.  Please review the results and patient needs to be contacted asap as she is very worried. Report is on the counter for review.

## 2015-12-24 DIAGNOSIS — R922 Inconclusive mammogram: Secondary | ICD-10-CM | POA: Diagnosis not present

## 2015-12-24 DIAGNOSIS — R928 Other abnormal and inconclusive findings on diagnostic imaging of breast: Secondary | ICD-10-CM | POA: Diagnosis not present

## 2015-12-31 ENCOUNTER — Emergency Department (HOSPITAL_BASED_OUTPATIENT_CLINIC_OR_DEPARTMENT_OTHER)
Admission: EM | Admit: 2015-12-31 | Discharge: 2015-12-31 | Disposition: A | Discharge status: Home / Self Care | Payer: Medicare Other | Attending: Emergency Medicine | Admitting: Emergency Medicine

## 2015-12-31 ENCOUNTER — Encounter (HOSPITAL_BASED_OUTPATIENT_CLINIC_OR_DEPARTMENT_OTHER): Payer: Self-pay

## 2015-12-31 DIAGNOSIS — Z862 Personal history of diseases of the blood and blood-forming organs and certain disorders involving the immune mechanism: Secondary | ICD-10-CM | POA: Diagnosis not present

## 2015-12-31 DIAGNOSIS — F419 Anxiety disorder, unspecified: Secondary | ICD-10-CM | POA: Diagnosis not present

## 2015-12-31 DIAGNOSIS — R2241 Localized swelling, mass and lump, right lower limb: Secondary | ICD-10-CM | POA: Diagnosis not present

## 2015-12-31 DIAGNOSIS — Z7951 Long term (current) use of inhaled steroids: Secondary | ICD-10-CM | POA: Diagnosis not present

## 2015-12-31 DIAGNOSIS — Z86718 Personal history of other venous thrombosis and embolism: Secondary | ICD-10-CM | POA: Insufficient documentation

## 2015-12-31 DIAGNOSIS — Z7984 Long term (current) use of oral hypoglycemic drugs: Secondary | ICD-10-CM | POA: Insufficient documentation

## 2015-12-31 DIAGNOSIS — Z86018 Personal history of other benign neoplasm: Secondary | ICD-10-CM | POA: Insufficient documentation

## 2015-12-31 DIAGNOSIS — M79671 Pain in right foot: Secondary | ICD-10-CM | POA: Diagnosis not present

## 2015-12-31 DIAGNOSIS — Z79899 Other long term (current) drug therapy: Secondary | ICD-10-CM | POA: Insufficient documentation

## 2015-12-31 DIAGNOSIS — E669 Obesity, unspecified: Secondary | ICD-10-CM | POA: Diagnosis not present

## 2015-12-31 DIAGNOSIS — I10 Essential (primary) hypertension: Secondary | ICD-10-CM | POA: Insufficient documentation

## 2015-12-31 DIAGNOSIS — E119 Type 2 diabetes mellitus without complications: Secondary | ICD-10-CM | POA: Insufficient documentation

## 2015-12-31 DIAGNOSIS — E785 Hyperlipidemia, unspecified: Secondary | ICD-10-CM | POA: Diagnosis not present

## 2015-12-31 DIAGNOSIS — Z87891 Personal history of nicotine dependence: Secondary | ICD-10-CM | POA: Insufficient documentation

## 2015-12-31 NOTE — ED Notes (Signed)
Pt with right foot pain since Monday. Denies known injury. New shoes.

## 2015-12-31 NOTE — Discharge Instructions (Signed)
Buy new shoes.  Use Moleskin (from the pharmacy) to pad the sore area.

## 2015-12-31 NOTE — ED Provider Notes (Signed)
CSN: [id number]     Arrival date & time 12/31/15  1911 History  By signing my name below, I, Rowan Blase, attest that this documentation has been prepared under the direction and in the presence of Tanna Furry, MD . Electronically Signed: Rowan Blase, Scribe. 12/31/2015. 7:31 PM.   Chief Complaint  Patient presents with  . Foot Pain   The history is provided by the patient. No language interpreter was used.   HPI Comments:  Heather Roberts is a 70 y.o. female with PMHx of DVT, HLD, HTN, DM, and CAD who presents to the Emergency Department complaining of gradual onset, mild right foot pain onset three days ago. Pt reports associated right knee swelling and soreness. She took Advil with mild relief. Pt states she changed to new shoes ~ 1 wk ago. She notes surgeries on both knees. Pt denies any recent injury.  Past Medical History  Diagnosis Date  . History of DVT (deep vein thrombosis) 1994  . Hyperlipidemia   . Hypertension   . DJD (degenerative joint disease), lumbar   . Bouchard nodes (DJD hand)   . Allergic rhinitis   . Diabetes mellitus type II   . Anxiety   . Glaucoma   . Fibroids   . Palpitations 12/18/2012  . Nocturia 12/18/2012  . Anemia 03/19/2013  . Carotid artery disease ([place]) 03/24/2014  . Cervical cancer screening 07/31/2015  . Obesity 07/31/2015   Past Surgical History  Procedure Laterality Date  . Knee arthroscopy    . Cholecystectomy    . Abdominal hysterectomy    . Eye surgery  09/2014, 08/2014    Cataracts removal   Family History  Problem Relation Age of Onset  . Stroke    . Diabetes Paternal Grandmother   . Diabetes Mother   . Kidney disease Mother   . COPD Father     smoker  . Stroke Sister   . Breast cancer Sister     s/p mastectomy  . COPD Brother    Social History  Substance Use Topics  . Smoking status: Former Smoker -- 0.30 packs/day for 8 years    Types: Cigarettes  . Smokeless tobacco: Never Used  . Alcohol Use: 0.0  oz/week    0 Standard drinks or equivalent per week     Comment: Rarely---glass of wine with a meeting   OB History    Gravida Para Term Preterm AB TAB SAB Ectopic Multiple Living   3 3 3       3      Review of Systems  Constitutional: Negative for fever, chills, diaphoresis, appetite change and fatigue.  HENT: Negative for mouth sores, sore throat and trouble swallowing.   Eyes: Negative for visual disturbance.  Respiratory: Negative for cough, chest tightness, shortness of breath and wheezing.   Cardiovascular: Negative for chest pain.  Gastrointestinal: Negative for nausea, vomiting, abdominal pain, diarrhea and abdominal distention.  Endocrine: Negative for polydipsia, polyphagia and polyuria.  Genitourinary: Negative for dysuria, frequency and hematuria.  Musculoskeletal: Positive for joint swelling (right knee) and arthralgias (right foot). Negative for gait problem.  Skin: Negative for color change, pallor and rash.  Neurological: Negative for dizziness, syncope, light-headedness and headaches.  Hematological: Does not bruise/bleed easily.  Psychiatric/Behavioral: Negative for behavioral problems and confusion.   Allergies  Review of patient's allergies indicates no known allergies.  Home Medications   Prior to Admission medications   Medication Sig Start Date End Date Taking? Authorizing Provider  amLODipine (NORVASC) 10  MG tablet Take 1 tablet (10 mg total) by mouth daily. 04/16/15  Yes Lelon Perla, MD  aspirin 81 MG tablet Take 81 mg by mouth daily.     Yes Historical Provider, MD  atorvastatin (LIPITOR) 20 MG tablet Take 1 tablet (20 mg total) by mouth daily. 04/16/15  Yes Lelon Perla, MD  Chlorphen-Phenyleph-APAP (CORICIDIN D COLD/FLU/SINUS) 2-5-325 MG TABS Take 1 tablet by mouth as directed.   Yes Historical Provider, MD  Cholecalciferol (VITAMIN D) 1000 UNITS capsule Take 1,000 Units by mouth. ocassionally   Yes Historical Provider, MD  Cyanocobalamin (B-12)  1000 MCG CAPS Take 1 tablet by mouth daily.   Yes Historical Provider, MD  fluticasone (FLONASE) 50 MCG/ACT nasal spray Place 2 sprays into both nostrils daily as needed for allergies or rhinitis. 12/17/13  Yes Mosie Lukes, MD  glucose blood (GLUCOSE METER TEST) test strip To use with onetouch verio flex DX E11.9 07/31/15  Yes Mosie Lukes, MD  glucose blood (ONETOUCH VERIO) test strip Check blood sugar once daily.  DX E11.9 07/31/15  Yes Mosie Lukes, MD  losartan (COZAAR) 100 MG tablet TAKE 1 TABLET BY MOUTH EVERY DAY 05/20/14  Yes Lelon Perla, MD  metFORMIN (GLUCOPHAGE) 500 MG tablet TAKE 1 TABLET BY MOUTH TWICE DAILY WITH MEALS 04/28/15  Yes Mosie Lukes, MD  metoprolol succinate (TOPROL-XL) 50 MG 24 hr tablet TAKE 1 TABLET BY MOUTH DAILY. TAKE WITH OR IMMEDIATELY FOLLOWING A MEAL 10/01/15  Yes Lelon Perla, MD  mometasone (NASONEX) 50 MCG/ACT nasal spray Place 2 sprays into the nose daily.   Yes Historical Provider, MD  nitroGLYCERIN (NITROSTAT) 0.4 MG SL tablet Place 0.4 mg under the tongue every 5 (five) minutes as needed for chest pain.   Yes Historical Provider, MD  Turmeric 500 MG CAPS Take 1 tablet by mouth daily.   Yes Historical Provider, MD   BP 168/88 mmHg  Pulse 96  Temp(Src) 98.3 F (36.8 C) (Oral)  Resp 16  Ht 5\' 2"  (1.575 m)  Wt 177 lb (80.287 kg)  BMI 32.37 kg/m2  SpO2 100% Physical Exam  Constitutional: She is oriented to person, place, and time. She appears well-developed and well-nourished. No distress.  HENT:  Head: Normocephalic and atraumatic.  Right Ear: Hearing normal.  Left Ear: Hearing normal.  Nose: Nose normal.  Mouth/Throat: Oropharynx is clear and moist and mucous membranes are normal.  Eyes: Conjunctivae and EOM are normal. Pupils are equal, round, and reactive to light.  Neck: Normal range of motion. Neck supple.  Cardiovascular: Regular rhythm, S1 normal and S2 normal.  Exam reveals no gallop and no friction rub.   No murmur  heard. Pulmonary/Chest: Effort normal and breath sounds normal. No respiratory distress. She exhibits no tenderness.  Abdominal: Soft. Normal appearance and bowel sounds are normal. There is no hepatosplenomegaly. There is no tenderness. There is no rebound, no guarding, no tenderness at McBurney's point and negative Murphy's sign. No hernia.  Musculoskeletal:  Knee: normal exam Right foot: TTP and soft tissue swelling base right fifth metatarsal; pain with dorsi flexion inversion; reproduces pain proximally   Neurological: She is alert and oriented to person, place, and time. She has normal strength. No cranial nerve deficit or sensory deficit. Coordination normal. GCS eye subscore is 4. GCS verbal subscore is 5. GCS motor subscore is 6.  Skin: Skin is warm, dry and intact. No rash noted. No cyanosis.  Psychiatric: She has a normal mood and affect. Her  speech is normal and behavior is normal. Thought content normal.  Nursing note and vitals reviewed.   ED Course  Procedures  DIAGNOSTIC STUDIES:  Oxygen Saturation is 100% on RA, normal by my interpretation.    COORDINATION OF CARE:  7:28 PM Informed pt she did not need any imaging studies. Recommended different shoes and use of moleskin. Discussed treatment plan with pt at bedside and pt agreed to plan.  Labs Review Labs Reviewed - No data to display  Imaging Review No results found. I have personally reviewed and evaluated these images and lab results as part of my medical decision-making.   EKG Interpretation None      MDM   Final diagnoses:  Right foot pain    I agree with  the patient. Her shoes are too tight and is making her fifth metatarsal sore. Advised either buy new shoes, loosen her shoes, or cover with moleskin.   Tanna Furry, MD 12/31/15 [phone]

## 2016-01-01 ENCOUNTER — Encounter: Payer: Self-pay | Admitting: Family Medicine

## 2016-01-01 ENCOUNTER — Ambulatory Visit (HOSPITAL_BASED_OUTPATIENT_CLINIC_OR_DEPARTMENT_OTHER)
Admission: RE | Admit: 2016-01-01 | Discharge: 2016-01-01 | Disposition: A | Discharge status: Home / Self Care | Payer: Medicare Other | Source: Ambulatory Visit | Attending: Family Medicine | Admitting: Family Medicine

## 2016-01-01 ENCOUNTER — Telehealth: Payer: Self-pay | Admitting: General Practice

## 2016-01-01 ENCOUNTER — Ambulatory Visit (INDEPENDENT_AMBULATORY_CARE_PROVIDER_SITE_OTHER): Payer: Medicare Other | Admitting: Family Medicine

## 2016-01-01 ENCOUNTER — Other Ambulatory Visit: Payer: Self-pay | Admitting: Family Medicine

## 2016-01-01 VITALS — BP 132/84 | HR 86 | Temp 98.2°F | Resp 16 | Wt 185.2 lb

## 2016-01-01 DIAGNOSIS — M25561 Pain in right knee: Secondary | ICD-10-CM

## 2016-01-01 DIAGNOSIS — M19071 Primary osteoarthritis, right ankle and foot: Secondary | ICD-10-CM | POA: Diagnosis not present

## 2016-01-01 DIAGNOSIS — M2011 Hallux valgus (acquired), right foot: Secondary | ICD-10-CM | POA: Diagnosis not present

## 2016-01-01 DIAGNOSIS — M7989 Other specified soft tissue disorders: Secondary | ICD-10-CM | POA: Diagnosis not present

## 2016-01-01 DIAGNOSIS — M7741 Metatarsalgia, right foot: Secondary | ICD-10-CM | POA: Insufficient documentation

## 2016-01-01 DIAGNOSIS — M25461 Effusion, right knee: Secondary | ICD-10-CM

## 2016-01-01 NOTE — Assessment & Plan Note (Signed)
New.  Pt's PE consistent w/ likely Baker's cyst.  Get [place] to assess.  Refer to sports med for complete evaluation and tx.  Pt expressed understanding and is in agreement w/ plan.

## 2016-01-01 NOTE — Patient Instructions (Signed)
Go downstairs to get your ultrasound and xray done- they'll do it now Our referral coordinator will call you with your Ortho appt Call with any questions or concerns We're going to figure this out!!!

## 2016-01-01 NOTE — Telephone Encounter (Signed)
Error

## 2016-01-01 NOTE — Progress Notes (Signed)
   Subjective:    Patient ID: Heather Roberts, female    DOB: 1946/06/02, 70 y.o.   MRN: [id number]  HPI ER f/u- pt went to ER yesterday w/ R foot pain.  Also having R knee swelling.  Pt had TTP over base of 5th metatarsal but no imaging was done.  Pt was told to change her footwear b/c this was too tight or add Moleskin.  Pt plans to return her new shoes today that she got a few weeks ago.  Pain w/ knee flexion.  Pt has deformity of R lateral foot.  Pt reports pain in knee and foot improved w/ hot tub and subsequent icing.   Review of Systems For ROS see HPI     Objective:   Physical Exam  Constitutional: She is oriented to person, place, and time. She appears well-developed and well-nourished. No distress.  Cardiovascular: Intact distal pulses.   Musculoskeletal:  Large soft tissue mass posterior to R knee- most consistent w/ Baker's cyst Pain w/ R knee flexion, no pain w/ extension until she reached terminal extension TTP over R 5th metatarsal bony prominence w/o swelling or induration  Neurological: She is alert and oriented to person, place, and time.  Skin: Skin is warm and dry. No erythema.  Psychiatric: She has a normal mood and affect. Her behavior is normal. Thought content normal.  Vitals reviewed.         Assessment & Plan:

## 2016-01-01 NOTE — Assessment & Plan Note (Signed)
New.  Get xray to r/o stress fracture.  Discussed need for footwear w/ wide toebox to prevent additional pressure.  Pt referred to ortho for evaluation and ongoing management.  Pt expressed understanding and is in agreement w/ plan.

## 2016-01-01 NOTE — Progress Notes (Signed)
Pre visit review using our clinic review tool, if applicable. No additional management support is needed unless otherwise documented below in the visit note. 

## 2016-01-14 DIAGNOSIS — Z6833 Body mass index (BMI) 33.0-33.9, adult: Secondary | ICD-10-CM | POA: Diagnosis not present

## 2016-01-14 DIAGNOSIS — M1711 Unilateral primary osteoarthritis, right knee: Secondary | ICD-10-CM | POA: Diagnosis not present

## 2016-01-14 DIAGNOSIS — M25561 Pain in right knee: Secondary | ICD-10-CM | POA: Diagnosis not present

## 2016-01-29 ENCOUNTER — Ambulatory Visit: Payer: Medicare Other | Admitting: Family Medicine

## 2016-02-03 ENCOUNTER — Encounter: Payer: Self-pay | Admitting: Family Medicine

## 2016-02-03 ENCOUNTER — Ambulatory Visit (INDEPENDENT_AMBULATORY_CARE_PROVIDER_SITE_OTHER): Payer: Medicare Other | Admitting: Family Medicine

## 2016-02-03 VITALS — BP 112/82 | HR 60 | Temp 98.4°F | Ht 62.0 in | Wt 186.1 lb

## 2016-02-03 DIAGNOSIS — E669 Obesity, unspecified: Secondary | ICD-10-CM

## 2016-02-03 DIAGNOSIS — E119 Type 2 diabetes mellitus without complications: Secondary | ICD-10-CM | POA: Diagnosis not present

## 2016-02-03 DIAGNOSIS — Z13820 Encounter for screening for osteoporosis: Secondary | ICD-10-CM

## 2016-02-03 DIAGNOSIS — E1169 Type 2 diabetes mellitus with other specified complication: Secondary | ICD-10-CM

## 2016-02-03 DIAGNOSIS — G4700 Insomnia, unspecified: Secondary | ICD-10-CM

## 2016-02-03 DIAGNOSIS — Z23 Encounter for immunization: Secondary | ICD-10-CM

## 2016-02-03 DIAGNOSIS — E785 Hyperlipidemia, unspecified: Secondary | ICD-10-CM

## 2016-02-03 DIAGNOSIS — I10 Essential (primary) hypertension: Secondary | ICD-10-CM | POA: Diagnosis not present

## 2016-02-03 DIAGNOSIS — M47899 Other spondylosis, site unspecified: Secondary | ICD-10-CM

## 2016-02-03 HISTORY — DX: Insomnia, unspecified: G47.00

## 2016-02-03 LAB — COMPREHENSIVE METABOLIC PANEL
ALT: 20 U/L (ref 0–35)
AST: 24 U/L (ref 0–37)
Albumin: 4 g/dL (ref 3.5–5.2)
Alkaline Phosphatase: 96 U/L (ref 39–117)
BILIRUBIN TOTAL: 0.6 mg/dL (ref 0.2–1.2)
BUN: 12 mg/dL (ref 6–23)
CO2: 29 meq/L (ref 19–32)
Calcium: 9.8 mg/dL (ref 8.4–10.5)
Chloride: 101 mEq/L (ref 96–112)
Creatinine, Ser: 0.84 mg/dL (ref 0.40–1.20)
GFR: 86.3 mL/min (ref >60.00)
GLUCOSE: 190 mg/dL — AB (ref 70–99)
POTASSIUM: 3.8 meq/L (ref 3.5–5.1)
SODIUM: 137 meq/L (ref 135–145)
Total Protein: 7.7 g/dL (ref 6.0–8.3)

## 2016-02-03 LAB — LIPID PANEL
CHOL/HDL RATIO: 4
Cholesterol: 216 mg/dL — ABNORMAL HIGH (ref 0–200)
HDL: 58.1 mg/dL (ref >39.00)
LDL Cholesterol: 126 mg/dL — ABNORMAL HIGH (ref 0–99)
NONHDL: 157.6
TRIGLYCERIDES: 157 mg/dL — AB (ref 0.0–149.0)
VLDL: 31.4 mg/dL (ref 0.0–40.0)

## 2016-02-03 LAB — CBC
HCT: 39.4 % (ref 36.0–46.0)
HEMOGLOBIN: 13.3 g/dL (ref 12.0–15.0)
MCHC: 33.7 g/dL (ref 30.0–36.0)
MCV: 91.9 fl (ref 78.0–100.0)
Platelets: 256 10*3/uL (ref 150.0–400.0)
RBC: 4.28 Mil/uL (ref 3.87–5.11)
RDW: 14.7 % (ref 11.5–15.5)
WBC: 5.8 10*3/uL (ref 4.0–10.5)

## 2016-02-03 LAB — HEMOGLOBIN A1C: Hgb A1c MFr Bld: 6.9 % — ABNORMAL HIGH (ref 4.6–6.5)

## 2016-02-03 LAB — TSH: TSH: 1.12 u[IU]/mL (ref 0.35–4.50)

## 2016-02-03 MED ORDER — MELOXICAM 15 MG PO TABS: 15.0000 mg | ORAL_TABLET | Freq: Every day | ORAL | Status: DC

## 2016-02-03 NOTE — Progress Notes (Signed)
Pre visit review using our clinic review tool, if applicable. No additional management support is needed unless otherwise documented below in the visit note. 

## 2016-02-03 NOTE — Progress Notes (Signed)
Subjective:    Patient ID: Heather Roberts, female    DOB: 07-05-1946, 70 y.o.   MRN: [id number]  Chief Complaint  Patient presents with  . Follow-up    6 month    HPI Patient is in today for follow up and has some concerns with insomnia due to concerns with family daughter is dying and son is in army which has been causing patient to struggle with sleep.  Patent also has been taking meloxicam to help with knee pain, but still having some knee problems with medication.  Patient has an apt with optometry in June to continue monitoring her diabetic retinopathy. Denies CP/palp/SOB/HA/congestion/fevers/GI or GU c/o. Taking meds as prescribed   Past Medical History  Diagnosis Date  . History of DVT (deep vein thrombosis) 1994  . Hyperlipidemia   . Hypertension   . DJD (degenerative joint disease), lumbar   . Bouchard nodes (DJD hand)   . Allergic rhinitis   . Diabetes mellitus type II   . Anxiety   . Glaucoma   . Fibroids   . Palpitations 12/18/2012  . Nocturia 12/18/2012  . Anemia 03/19/2013  . Carotid artery disease ([place]) 03/24/2014  . Cervical cancer screening 07/31/2015  . Obesity 07/31/2015  . Insomnia 02/03/2016    Past Surgical History  Procedure Laterality Date  . Knee arthroscopy    . Cholecystectomy    . Abdominal hysterectomy    . Eye surgery  09/2014, 08/2014    Cataracts removal    Family History  Problem Relation Age of Onset  . Stroke    . Diabetes Paternal Grandmother   . Diabetes Mother   . Kidney disease Mother   . COPD Father     smoker  . Stroke Sister   . Breast cancer Sister     s/p mastectomy  . COPD Brother     Social History   Social History  . Marital Status: Married    Spouse Name: Essick  . Number of Children: 3  . Years of Education: N/A   Occupational History  . Homemaker     used to work for [employer]  . Smoking status: Former Smoker -- 0.30 packs/day for 8 years    Types:  Cigarettes  . Smokeless tobacco: Never Used  . Alcohol Use: 0.0 oz/week    0 Standard drinks or equivalent per week     Comment: Rarely---glass of wine with a meeting  . Drug Use: No  . Sexual Activity:    Partners: Male     Comment: lives with husband, no dietary restrictions, minimizes dairy is walking more now   Other Topics Concern  . 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 amLODipine (NORVASC) 10 MG tablet Take 1 tablet (10 mg total) by mouth daily. 90 tablet 3  . aspirin 81 MG tablet Take 81 mg by mouth daily.      Marland Kitchen atorvastatin (LIPITOR) 20 MG tablet Take 1 tablet (20 mg total) by mouth daily. 90 tablet 3  . Chlorphen-Phenyleph-APAP (CORICIDIN D COLD/FLU/SINUS) 2-5-325 MG TABS Take 1 tablet by mouth as directed.    Marland Kitchen  Cholecalciferol (VITAMIN D) 1000 UNITS capsule Take 1,000 Units by mouth. ocassionally    . Cyanocobalamin (B-12) 1000 MCG CAPS Take 1 tablet by mouth daily.    . fluticasone (FLONASE) 50 MCG/ACT nasal spray Place 2 sprays into both nostrils daily as needed for allergies or rhinitis. 16 g 6  . glucose blood (GLUCOSE METER TEST) test strip To use with onetouch verio flex DX E11.9 100 each 6  . glucose blood (ONETOUCH VERIO) test strip Check blood sugar once daily.  DX E11.9 100 each 6  . losartan (COZAAR) 100 MG tablet TAKE 1 TABLET BY MOUTH EVERY DAY 30 tablet 0  . metFORMIN (GLUCOPHAGE) 500 MG tablet TAKE 1 TABLET BY MOUTH TWICE DAILY WITH MEALS 180 tablet 3  . metoprolol succinate (TOPROL-XL) 50 MG 24 hr tablet TAKE 1 TABLET BY MOUTH DAILY. TAKE WITH OR IMMEDIATELY FOLLOWING A MEAL 90 tablet 3  . mometasone (NASONEX) 50 MCG/ACT nasal spray Place 2 sprays into the nose daily.    . nitroGLYCERIN (NITROSTAT) 0.4 MG SL tablet Place 0.4 mg  under the tongue every 5 (five) minutes as needed for chest pain.    . Turmeric 500 MG CAPS Take 1 tablet by mouth daily.     No facility-administered medications prior to visit.    No Known Allergies  Review of Systems  Constitutional: Positive for malaise/fatigue. Negative for fever.  HENT: Negative for congestion.   Eyes: Negative for blurred vision.  Respiratory: Negative for shortness of breath.   Cardiovascular: Negative for chest pain, palpitations and leg swelling.  Gastrointestinal: Negative for nausea, abdominal pain and blood in stool.  Genitourinary: Negative for dysuria and frequency.  Musculoskeletal: Positive for myalgias, back pain and joint pain. Negative for falls.  Skin: Negative for rash.  Neurological: Negative for dizziness, loss of consciousness and headaches.  Endo/Heme/Allergies: Negative for environmental allergies.  Psychiatric/Behavioral: Negative for depression. The patient is nervous/anxious and has insomnia.        Objective:    Physical Exam  Constitutional: She is oriented to person, place, and time. She appears well-developed and well-nourished. No distress.  HENT:  Head: Normocephalic and atraumatic.  Eyes: Conjunctivae are normal.  Neck: Neck supple. No thyromegaly present.  Cardiovascular: Normal rate, regular rhythm and normal heart sounds.   No murmur heard. Pulmonary/Chest: Effort normal and breath sounds normal. No respiratory distress.  Abdominal: Soft. Bowel sounds are normal. She exhibits no distension and no mass. There is no tenderness.  Musculoskeletal: She exhibits no edema.  Lymphadenopathy:    She has no cervical adenopathy.  Neurological: She is alert and oriented to person, place, and time.  Skin: Skin is warm and dry.  Psychiatric: She has a normal mood and affect. Her behavior is normal.    BP 112/82 mmHg  Pulse 60  Temp(Src) 98.4 F (36.9 C) (Oral)  Ht 5\' 2"  (1.575 m)  Wt 186 lb 2 oz (84.426 kg)  BMI 34.03 kg/m2   SpO2 98% Wt Readings from Last 3 Encounters:  02/03/16 186 lb 2 oz (84.426 kg)  01/01/16 185 lb 4 oz (84.029 kg)  12/31/15 177 lb (80.287 kg)     Lab Results  Component Value Date   WBC 7.2 07/31/2015   HGB 13.4 07/31/2015   HCT 40.8 07/31/2015   PLT 294.0 07/31/2015   GLUCOSE 99 07/31/2015   CHOL 152 07/31/2015   TRIG 101 07/31/2015   HDL 73 07/31/2015   LDLDIRECT 118.5 10/04/2007   LDLCALC 59 07/31/2015   ALT  18 07/31/2015   AST 26 07/31/2015   NA 139 07/31/2015   K 3.6 07/31/2015   CL 100 07/31/2015   CREATININE 0.94 07/31/2015   BUN 16 07/31/2015   CO2 27 07/31/2015   TSH 1.12 07/31/2015   HGBA1C 6.8* 07/31/2015   MICROALBUR 0.50 06/14/2012    Lab Results  Component Value Date   TSH 1.12 07/31/2015   Lab Results  Component Value Date   WBC 7.2 07/31/2015   HGB 13.4 07/31/2015   HCT 40.8 07/31/2015   MCV 92.4 07/31/2015   PLT 294.0 07/31/2015   Lab Results  Component Value Date   NA 139 07/31/2015   K 3.6 07/31/2015   CO2 27 07/31/2015   GLUCOSE 99 07/31/2015   BUN 16 07/31/2015   CREATININE 0.94 07/31/2015   BILITOT 1.0 07/31/2015   ALKPHOS 115 07/31/2015   AST 26 07/31/2015   ALT 18 07/31/2015   PROT 7.7 07/31/2015   ALBUMIN 4.4 07/31/2015   CALCIUM 10.1 07/31/2015   GFR 75.91 07/31/2015   Lab Results  Component Value Date   CHOL 152 07/31/2015   Lab Results  Component Value Date   HDL 73 07/31/2015   Lab Results  Component Value Date   LDLCALC 59 07/31/2015   Lab Results  Component Value Date   TRIG 101 07/31/2015   Lab Results  Component Value Date   CHOLHDL 2.1 07/31/2015   Lab Results  Component Value Date   HGBA1C 6.8* 07/31/2015       Assessment & Plan:   Problem List Items Addressed This Visit    Diabetes mellitus type 2 in obese ([place])    hgba1c acceptable, minimize simple carbs. Increase exercise as tolerated. Continue current meds      Relevant Orders   CBC   Lipid panel   Comprehensive metabolic panel     TSH   Hemoglobin A1c   Essential hypertension - Primary    Well controlled, no changes to meds. Encouraged heart healthy diet such as the DASH diet and exercise as tolerated.  Prevnar given today      Relevant Orders   CBC   Lipid panel   Comprehensive metabolic panel   TSH   Hemoglobin A1c   Hyperlipidemia associated with type 2 diabetes mellitus ([place])    Encouraged heart healthy diet, increase exercise, avoid trans fats, consider a krill oil cap daily      Relevant Orders   CBC   Lipid panel   Comprehensive metabolic panel   TSH   Hemoglobin A1c   Insomnia    Encouraged good sleep hygiene such as dark, quiet room. No blue/green glowing lights such as computer screens in bedroom. No alcohol or stimulants in evening. Cut down on caffeine as able. Regular exercise is helpful but not just prior to bed time. Melatonin      Relevant Orders   CBC   Lipid panel   Comprehensive metabolic panel   TSH   Hemoglobin A1c   Obesity    Encouraged DASH diet, decrease po intake and increase exercise as tolerated. Needs 7-8 hours of sleep nightly. Avoid trans fats, eat small, frequent meals every 4-5 hours with lean proteins, complex carbs and healthy fats. Minimize simple carbs, GMO foods.      Relevant Orders   CBC   Lipid panel   Comprehensive metabolic panel   TSH   Hemoglobin A1c   Osteoarthritis of spine    Given a rx for Meloxicam to use prn  Relevant Medications   meloxicam (MOBIC) 15 MG tablet   Osteoporosis screening   Relevant Orders   CBC   Lipid panel   Comprehensive metabolic panel   TSH   Hemoglobin A1c    Other Visit Diagnoses    Need for vaccination with 13-polyvalent pneumococcal conjugate vaccine        Relevant Orders    Pneumococcal conjugate vaccine 13-valent       I have changed Ms. Watkins Williams's meloxicam. I am also having her maintain her aspirin, Vitamin D, fluticasone, losartan, Turmeric, B-12, atorvastatin, amLODipine, metFORMIN,  glucose blood, glucose blood, nitroGLYCERIN, Chlorphen-Phenyleph-APAP, mometasone, and metoprolol succinate.  Meds ordered this encounter  Medications  . DISCONTD: meloxicam (MOBIC) 15 MG tablet    Sig: Take 15 mg by mouth daily.  . meloxicam (MOBIC) 15 MG tablet    Sig: Take 1 tablet (15 mg total) by mouth daily.    Dispense:  30 tablet    Refill:  1     [name], MD

## 2016-02-03 NOTE — Assessment & Plan Note (Signed)
Given a rx for Meloxicam to use prn

## 2016-02-03 NOTE — Assessment & Plan Note (Signed)
Encouraged good sleep hygiene such as dark, quiet room. No blue/green glowing lights such as computer screens in bedroom. No alcohol or stimulants in evening. Cut down on caffeine as able. Regular exercise is helpful but not just prior to bed time. Melatonin

## 2016-02-03 NOTE — Assessment & Plan Note (Signed)
hgba1c acceptable, minimize simple carbs. Increase exercise as tolerated. Continue current meds 

## 2016-02-03 NOTE — Assessment & Plan Note (Signed)
Encouraged DASH diet, decrease po intake and increase exercise as tolerated. Needs 7-8 hours of sleep nightly. Avoid trans fats, eat small, frequent meals every 4-5 hours with lean proteins, complex carbs and healthy fats. Minimize simple carbs, GMO foods. 

## 2016-02-03 NOTE — Assessment & Plan Note (Addendum)
Well controlled, no changes to meds. Encouraged heart healthy diet such as the DASH diet and exercise as tolerated.  Prevnar given today

## 2016-02-03 NOTE — Patient Instructions (Addendum)
Try melatonin to help with sleep you can purchase otc [employer]. Go to lab.  Insomnia Insomnia is a sleep disorder that makes it difficult to fall asleep or to stay asleep. Insomnia can cause tiredness (fatigue), low energy, difficulty concentrating, mood swings, and poor performance at work or school.  There are three different ways to classify insomnia:  Difficulty falling asleep.  Difficulty staying asleep.  Waking up too early in the morning. Any type of insomnia can be long-term (chronic) or short-term (acute). Both are common. Short-term insomnia usually lasts for three months or less. Chronic insomnia occurs at least three times a week for longer than three months. CAUSES  Insomnia may be caused by another condition, situation, or substance, such as:  Anxiety.  Certain medicines.  Gastroesophageal reflux disease (GERD) or other gastrointestinal conditions.  Asthma or other breathing conditions.  Restless legs syndrome, sleep apnea, or other sleep disorders.  Chronic pain.  Menopause. This may include hot flashes.  Stroke.  Abuse of alcohol, tobacco, or illegal drugs.  Depression.  Caffeine.   Neurological disorders, such as Alzheimer disease.  An overactive thyroid (hyperthyroidism). The cause of insomnia may not be known. RISK FACTORS Risk factors for insomnia include:  Gender. Women are more commonly affected than men.  Age. Insomnia is more common as you get older.  Stress. This may involve your professional or personal life.  Income. Insomnia is more common in people with lower income.  Lack of exercise.   Irregular work schedule or night shifts.  Traveling between different time zones. SIGNS AND SYMPTOMS If you have insomnia, trouble falling asleep or trouble staying asleep is the main symptom. This may lead to other symptoms, such as:  Feeling fatigued.  Feeling nervous about going to sleep.  Not feeling rested in the morning.  Having  trouble concentrating.  Feeling irritable, anxious, or depressed. TREATMENT  Treatment for insomnia depends on the cause. If your insomnia is caused by an underlying condition, treatment will focus on addressing the condition. Treatment may also include:   Medicines to help you sleep.  Counseling or therapy.  Lifestyle adjustments. HOME CARE INSTRUCTIONS   Take medicines only as directed by your health care provider.  Keep regular sleeping and waking hours. Avoid naps.  Keep a sleep diary to help you and your health care provider figure out what could be causing your insomnia. Include:   When you sleep.  When you wake up during the night.  How well you sleep.   How rested you feel the next day.  Any side effects of medicines you are taking.  What you eat and drink.   Make your bedroom a comfortable place where it is easy to fall asleep:  Put up shades or special blackout curtains to block light from outside.  Use a white noise machine to block noise.  Keep the temperature cool.   Exercise regularly as directed by your health care provider. Avoid exercising right before bedtime.  Use relaxation techniques to manage stress. Ask your health care provider to suggest some techniques that may work well for you. These may include:  Breathing exercises.  Routines to release muscle tension.  Visualizing peaceful scenes.  Cut back on alcohol, caffeinated beverages, and cigarettes, especially close to bedtime. These can disrupt your sleep.  Do not overeat or eat spicy foods right before bedtime. This can lead to digestive discomfort that can make it hard for you to sleep.  Limit screen use before bedtime. This includes:  Watching TV.  Using your smartphone, tablet, and computer.  Stick to a routine. This can help you fall asleep faster. Try to do a quiet activity, brush your teeth, and go to bed at the same time each night.  Get out of bed if you are still awake  after 15 minutes of trying to sleep. Keep the lights down, but try reading or doing a quiet activity. When you feel sleepy, go back to bed.  Make sure that you drive carefully. Avoid driving if you feel very sleepy.  Keep all follow-up appointments as directed by your health care provider. This is important. SEEK MEDICAL CARE IF:   You are tired throughout the day or have trouble in your daily routine due to sleepiness.  You continue to have sleep problems or your sleep problems get worse. SEEK IMMEDIATE MEDICAL CARE IF:   You have serious thoughts about hurting yourself or someone else.   This information is not intended to replace advice given to you by your health care provider. Make sure you discuss any questions you have with your health care provider.   Document Released: 10/01/2000 Document Revised: 06/25/2015 Document Reviewed: 07/05/2014 Elsevier Interactive Patient Education [employer].

## 2016-02-03 NOTE — Assessment & Plan Note (Signed)
Encouraged heart healthy diet, increase exercise, avoid trans fats, consider a krill oil cap daily 

## 2016-02-06 DIAGNOSIS — E119 Type 2 diabetes mellitus without complications: Secondary | ICD-10-CM | POA: Diagnosis not present

## 2016-02-06 DIAGNOSIS — H40003 Preglaucoma, unspecified, bilateral: Secondary | ICD-10-CM | POA: Diagnosis not present

## 2016-02-06 LAB — HM DIABETES EYE EXAM

## 2016-02-09 ENCOUNTER — Encounter: Payer: Self-pay | Admitting: Family Medicine

## 2016-02-09 DIAGNOSIS — Z1211 Encounter for screening for malignant neoplasm of colon: Secondary | ICD-10-CM | POA: Diagnosis not present

## 2016-02-09 DIAGNOSIS — Z1212 Encounter for screening for malignant neoplasm of rectum: Secondary | ICD-10-CM | POA: Diagnosis not present

## 2016-02-09 LAB — COLOGUARD: Cologuard: NEGATIVE

## 2016-02-24 ENCOUNTER — Telehealth: Payer: Self-pay | Admitting: Family Medicine

## 2016-02-24 ENCOUNTER — Encounter: Payer: Self-pay | Admitting: Family Medicine

## 2016-02-24 NOTE — Telephone Encounter (Signed)
Received cologuard results/which were negative. Abstracted in her chart/health maintenance updated as well. Called the patient informed cologuard results negative.

## 2016-04-06 ENCOUNTER — Telehealth: Payer: Self-pay | Admitting: Cardiology

## 2016-04-06 NOTE — Telephone Encounter (Signed)
Pt c/o swelling: STAT is pt has developed SOB within 24 hours  1. How long have you been experiencing swelling? About 1 week   2. Where is the swelling located? Around left leg/left ankle   3.  Are you currently taking a "fluid pill"? amlodipine   4.  Are you currently SOB? Some SOB this past weekend- 6/17-6/18  5.  Have you traveled recently?no- but spent a long time sitting in the car last week.

## 2016-04-06 NOTE — Telephone Encounter (Signed)
Patient explains she had some swelling in her left leg after a long car drive on Saturday. This resolved. She also noted the same evening, she had a dry throat and trouble swallowing - drank some water and was OK. She denied chest pain, dizziness, palpitations.  Notes left leg not tender, she has been walking and this has been improving swelling. Asked if she wanted to be seen - she would prefer to see Dr. Stanford Breed, only in [facility] - does not want to see PA. Does not feel like she needs to be seen in office at this time.  I advised patient to call if she changes her mind & would like to schedule a visit or if she has new symptoms/concerns.

## 2016-04-13 ENCOUNTER — Emergency Department (HOSPITAL_BASED_OUTPATIENT_CLINIC_OR_DEPARTMENT_OTHER)
Admission: EM | Admit: 2016-04-13 | Discharge: 2016-04-13 | Disposition: A | Discharge status: Home / Self Care | Payer: Medicare Other | Attending: Emergency Medicine | Admitting: Emergency Medicine

## 2016-04-13 ENCOUNTER — Encounter (HOSPITAL_BASED_OUTPATIENT_CLINIC_OR_DEPARTMENT_OTHER): Payer: Self-pay | Admitting: Emergency Medicine

## 2016-04-13 DIAGNOSIS — Z87891 Personal history of nicotine dependence: Secondary | ICD-10-CM | POA: Insufficient documentation

## 2016-04-13 DIAGNOSIS — I2510 Atherosclerotic heart disease of native coronary artery without angina pectoris: Secondary | ICD-10-CM | POA: Diagnosis not present

## 2016-04-13 DIAGNOSIS — R350 Frequency of micturition: Secondary | ICD-10-CM | POA: Insufficient documentation

## 2016-04-13 DIAGNOSIS — E119 Type 2 diabetes mellitus without complications: Secondary | ICD-10-CM | POA: Insufficient documentation

## 2016-04-13 DIAGNOSIS — E785 Hyperlipidemia, unspecified: Secondary | ICD-10-CM | POA: Diagnosis not present

## 2016-04-13 DIAGNOSIS — Z7984 Long term (current) use of oral hypoglycemic drugs: Secondary | ICD-10-CM | POA: Insufficient documentation

## 2016-04-13 DIAGNOSIS — I10 Essential (primary) hypertension: Secondary | ICD-10-CM | POA: Insufficient documentation

## 2016-04-13 DIAGNOSIS — R300 Dysuria: Secondary | ICD-10-CM

## 2016-04-13 LAB — URINALYSIS, ROUTINE W REFLEX MICROSCOPIC
Bilirubin Urine: NEGATIVE
GLUCOSE, UA: NEGATIVE mg/dL
Hgb urine dipstick: NEGATIVE
Ketones, ur: NEGATIVE mg/dL
LEUKOCYTES UA: NEGATIVE
Nitrite: NEGATIVE
PH: 7 (ref 5.0–8.0)
Protein, ur: NEGATIVE mg/dL
Specific Gravity, Urine: 1.007 (ref 1.005–1.030)

## 2016-04-13 MED ORDER — CEPHALEXIN 500 MG PO CAPS: 500.0000 mg | ORAL_CAPSULE | Freq: Three times a day (TID) | ORAL | Status: DC

## 2016-04-13 MED ORDER — PHENAZOPYRIDINE HCL 200 MG PO TABS: 200.0000 mg | ORAL_TABLET | Freq: Three times a day (TID) | ORAL | Status: DC | PRN

## 2016-04-13 NOTE — ED Provider Notes (Signed)
CSN: [id number]     Arrival date & time 04/13/16  [id number] History   First MD Initiated Contact with Patient 04/13/16 [phone]     Chief Complaint  Patient presents with  . Urinary Tract Infection     (Consider location/radiation/quality/duration/timing/severity/associated sxs/prior Treatment) HPI  This is a 70 year old female with a history of DVT, hypertension, diabetes who presents with dysuria and frequency. Patient reports onset of symptoms on Sunday. She denies any fevers or back pain. She states that it got worse overnight. She has been taking an over-the-counter medication with minimal relief. She reports history of UTI with similar symptoms in the past. Denies any abdominal pain, nausea, vomiting.  Past Medical History  Diagnosis Date  . History of DVT (deep vein thrombosis) 1994  . Hyperlipidemia   . Hypertension   . DJD (degenerative joint disease), lumbar   . Bouchard nodes (DJD hand)   . Allergic rhinitis   . Diabetes mellitus type II   . Anxiety   . Glaucoma   . Fibroids   . Palpitations 12/18/2012  . Nocturia 12/18/2012  . Anemia 03/19/2013  . Carotid artery disease ([place]) 03/24/2014  . Cervical cancer screening 07/31/2015  . Obesity 07/31/2015  . Insomnia 02/03/2016   Past Surgical History  Procedure Laterality Date  . Knee arthroscopy    . Cholecystectomy    . Abdominal hysterectomy    . Eye surgery  09/2014, 08/2014    Cataracts removal   Family History  Problem Relation Age of Onset  . Stroke    . Diabetes Paternal Grandmother   . Diabetes Mother   . Kidney disease Mother   . COPD Father     smoker  . Stroke Sister   . Breast cancer Sister     s/p mastectomy  . COPD Brother    Social History  Substance Use Topics  . Smoking status: Former Smoker -- 0.30 packs/day for 8 years    Types: Cigarettes  . Smokeless tobacco: Never Used  . Alcohol Use: 0.0 oz/week    0 Standard drinks or equivalent per week     Comment: Rarely---glass of wine with a meeting    OB History    Gravida Para Term Preterm AB TAB SAB Ectopic Multiple Living   3 3 3       3      Review of Systems  Constitutional: Negative for fever.  Respiratory: Negative for shortness of breath.   Cardiovascular: Negative for chest pain.  Gastrointestinal: Negative for nausea, vomiting and abdominal pain.  Genitourinary: Positive for dysuria and frequency.  All other systems reviewed and are negative.     Allergies  Review of patient's allergies indicates no known allergies.  Home Medications   Prior to Admission medications   Medication Sig Start Date End Date Taking? Authorizing Provider  amLODipine (NORVASC) 10 MG tablet Take 1 tablet (10 mg total) by mouth daily. 04/16/15   Lelon Perla, MD  aspirin 81 MG tablet Take 81 mg by mouth daily.      Historical Provider, MD  atorvastatin (LIPITOR) 20 MG tablet Take 1 tablet (20 mg total) by mouth daily. 04/16/15   Lelon Perla, MD  cephALEXin (KEFLEX) 500 MG capsule Take 1 capsule (500 mg total) by mouth 3 (three) times daily. 04/13/16   Merryl Hacker, MD  Chlorphen-Phenyleph-APAP (CORICIDIN D COLD/FLU/SINUS) 2-5-325 MG TABS Take 1 tablet by mouth as directed.    Historical Provider, MD  Cholecalciferol (VITAMIN D) 1000 UNITS capsule Take  1,000 Units by mouth. ocassionally    Historical Provider, MD  Cyanocobalamin (B-12) 1000 MCG CAPS Take 1 tablet by mouth daily.    Historical Provider, MD  fluticasone (FLONASE) 50 MCG/ACT nasal spray Place 2 sprays into both nostrils daily as needed for allergies or rhinitis. 12/17/13   Mosie Lukes, MD  glucose blood (GLUCOSE METER TEST) test strip To use with onetouch verio flex DX E11.9 07/31/15   Mosie Lukes, MD  glucose blood (ONETOUCH VERIO) test strip Check blood sugar once daily.  DX E11.9 07/31/15   Mosie Lukes, MD  losartan (COZAAR) 100 MG tablet TAKE 1 TABLET BY MOUTH EVERY DAY 05/20/14   Lelon Perla, MD  metFORMIN (GLUCOPHAGE) 500 MG tablet TAKE 1 TABLET BY  MOUTH TWICE DAILY WITH MEALS 04/28/15   Mosie Lukes, MD  metoprolol succinate (TOPROL-XL) 50 MG 24 hr tablet TAKE 1 TABLET BY MOUTH DAILY. TAKE WITH OR IMMEDIATELY FOLLOWING A MEAL 10/01/15   Lelon Perla, MD  mometasone (NASONEX) 50 MCG/ACT nasal spray Place 2 sprays into the nose daily.    Historical Provider, MD  nitroGLYCERIN (NITROSTAT) 0.4 MG SL tablet Place 0.4 mg under the tongue every 5 (five) minutes as needed for chest pain.    Historical Provider, MD  phenazopyridine (PYRIDIUM) 200 MG tablet Take 1 tablet (200 mg total) by mouth 3 (three) times daily as needed for pain. 04/13/16   Merryl Hacker, MD  Turmeric 500 MG CAPS Take 1 tablet by mouth daily.    Historical Provider, MD   BP 164/75 mmHg  Pulse 63  Temp(Src) 98.3 F (36.8 C) (Oral)  Resp 18  SpO2 98% Physical Exam  Constitutional: She is oriented to person, place, and time. She appears well-developed and well-nourished. No distress.  HENT:  Head: Normocephalic and atraumatic.  Cardiovascular: Normal rate, regular rhythm and normal heart sounds.   No murmur heard. Pulmonary/Chest: Effort normal and breath sounds normal. No respiratory distress. She has no wheezes.  Abdominal: Soft. Bowel sounds are normal. There is no tenderness. There is no rebound.  Genitourinary:  No CVA tenderness  Neurological: She is alert and oriented to person, place, and time.  Skin: Skin is warm and dry.  Psychiatric: She has a normal mood and affect.  Nursing note and vitals reviewed.   ED Course  Procedures (including critical care time) Labs Review Labs Reviewed  URINE CULTURE  URINALYSIS, ROUTINE W REFLEX MICROSCOPIC (NOT AT [facility])    Imaging Review No results found. I have personally reviewed and evaluated these images and lab results as part of my medical decision-making.   EKG Interpretation None      MDM   Final diagnoses:  Dysuria    Patient presents with dysuria and frequency. She is nontoxic on exam.  Reports similar symptoms with prior urinary tract infections. Vital signs are reassuring. Urinalysis without obvious urinary tract infection. Urine culture sent. Given consistency of symptoms with prior urinary tract infections, we'll provide the patient with an antibiotic to hold until culture results. She was given pyridium. She was given strict return precautions.  After history, exam, and medical workup I feel the patient has been appropriately medically screened and is safe for discharge home. Pertinent diagnoses were discussed with the patient. Patient was given return precautions.     Merryl Hacker, MD 04/13/16 [phone]

## 2016-04-13 NOTE — ED Notes (Signed)
Pt reports frequent urination and a little pain during urination and feels like UTI she has had in the past reports normal BM pattern

## 2016-04-13 NOTE — Discharge Instructions (Signed)
You were seen today for dysuria. There is no obvious urinary tract infection however urine culture has been sent. Given that your symptoms are characteristic of prior UTIs, you will be given an antibiotic to hold until urine culture has resulted. If you develop fevers, back pain or any new or worsening symptoms she should be reevaluated.  Dysuria Dysuria is pain or discomfort while urinating. The pain or discomfort may be felt in the tube that carries urine out of the bladder (urethra) or in the surrounding tissue of the genitals. The pain may also be felt in the groin area, lower abdomen, and lower back. You may have to urinate frequently or have the sudden feeling that you have to urinate (urgency). Dysuria can affect both men and women, but is more common in women. Dysuria can be caused by many different things, including:  Urinary tract infection in women.  Infection of the kidney or bladder.  Kidney stones or bladder stones.  Certain sexually transmitted infections (STIs), such as chlamydia.  Dehydration.  Inflammation of the vagina.  Use of certain medicines.  Use of certain soaps or scented products that cause irritation. HOME CARE INSTRUCTIONS Watch your dysuria for any changes. The following actions may help to reduce any discomfort you are feeling:  Drink enough fluid to keep your urine clear or pale yellow.  Empty your bladder often. Avoid holding urine for long periods of time.  After a bowel movement or urination, women should cleanse from front to back, using each tissue only once.  Empty your bladder after sexual intercourse.  Take medicines only as directed by your health care provider.  If you were prescribed an antibiotic medicine, finish it all even if you start to feel better.  Avoid caffeine, tea, and alcohol. They can irritate the bladder and make dysuria worse. In men, alcohol may irritate the prostate.  Keep all follow-up visits as directed by your health  care provider. This is important.  If you had any tests done to find the cause of dysuria, it is your responsibility to obtain your test results. Ask the lab or department performing the test when and how you will get your results. Talk with your health care provider if you have any questions about your results. SEEK MEDICAL CARE IF:  You develop pain in your back or sides.  You have a fever.  You have nausea or vomiting.  You have blood in your urine.  You are not urinating as often as you usually do. SEEK IMMEDIATE MEDICAL CARE IF:  You pain is severe and not relieved with medicines.  You are unable to hold down any fluids.  You or someone else notices a change in your mental function.  You have a rapid heartbeat at rest.  You have shaking or chills.  You feel extremely weak.   This information is not intended to replace advice given to you by your health care provider. Make sure you discuss any questions you have with your health care provider.   Document Released: 07/02/2004 Document Revised: 10/25/2014 Document Reviewed: 05/30/2014 Elsevier Interactive Patient Education [employer].

## 2016-04-14 LAB — URINE CULTURE

## 2016-05-03 ENCOUNTER — Telehealth: Payer: Self-pay | Admitting: Family Medicine

## 2016-05-03 DIAGNOSIS — I10 Essential (primary) hypertension: Secondary | ICD-10-CM

## 2016-05-03 MED ORDER — METOPROLOL SUCCINATE ER 50 MG PO TB24: ORAL_TABLET | ORAL | Status: DC

## 2016-05-03 MED ORDER — AMLODIPINE BESYLATE 10 MG PO TABS: 10.0000 mg | ORAL_TABLET | Freq: Every day | ORAL | Status: DC

## 2016-05-03 NOTE — Telephone Encounter (Signed)
Relation to [id number] Call back number:[phone] Pharmacy: [facility], [place] - [address] AT [place] [phone] (Fax)         Reason for call:  Patient requesting a refill metoprolol succinate (TOPROL-XL) 100 MG 24 hr tablet and states specialist stated PCP can refill

## 2016-05-03 NOTE — Telephone Encounter (Signed)
Cardiologist last filled.  Advise on this refill please.  Last office visit with Dr. Charlett Blake was 02/03/16

## 2016-05-03 NOTE — Telephone Encounter (Signed)
Medication filled to pharmacy as requested.   

## 2016-05-03 NOTE — Telephone Encounter (Signed)
Refilled for #30 days only. Called to inform the patient left a message.

## 2016-05-03 NOTE — Telephone Encounter (Signed)
°  Relation to [id number] Call back [phone] Pharmacy: [place], [place] - [address] AT [place] [phone] (Fax)          Reason for call: Patient requesting a refill amLODipine (NORVASC) 10 MG tablet due to appointment for 05/04/16 having to be [facility] due to provider. Patient did [facility] to 05/07/16. Please advise

## 2016-05-03 NOTE — Telephone Encounter (Signed)
Ok to refill. Same sig. 17-month only. Additional fills will have to come from Dr. Charlett Blake

## 2016-05-04 ENCOUNTER — Ambulatory Visit: Payer: Medicare Other | Admitting: Family Medicine

## 2016-05-07 ENCOUNTER — Ambulatory Visit: Payer: Medicare Other | Admitting: Family Medicine

## 2016-05-17 ENCOUNTER — Other Ambulatory Visit: Payer: Self-pay | Admitting: Family Medicine

## 2016-05-17 MED ORDER — METFORMIN HCL 500 MG PO TABS: 500.0000 mg | ORAL_TABLET | Freq: Two times a day (BID) | ORAL | 3 refills | Status: DC

## 2016-05-31 ENCOUNTER — Other Ambulatory Visit: Payer: Self-pay | Admitting: Family Medicine

## 2016-06-01 ENCOUNTER — Ambulatory Visit (INDEPENDENT_AMBULATORY_CARE_PROVIDER_SITE_OTHER): Payer: Medicare Other | Admitting: Family Medicine

## 2016-06-01 ENCOUNTER — Other Ambulatory Visit: Payer: Self-pay | Admitting: Family Medicine

## 2016-06-01 ENCOUNTER — Encounter: Payer: Self-pay | Admitting: Family Medicine

## 2016-06-01 DIAGNOSIS — I10 Essential (primary) hypertension: Secondary | ICD-10-CM

## 2016-06-01 DIAGNOSIS — E785 Hyperlipidemia, unspecified: Secondary | ICD-10-CM

## 2016-06-01 DIAGNOSIS — M545 Low back pain: Secondary | ICD-10-CM | POA: Diagnosis not present

## 2016-06-01 DIAGNOSIS — E119 Type 2 diabetes mellitus without complications: Secondary | ICD-10-CM

## 2016-06-01 DIAGNOSIS — E1169 Type 2 diabetes mellitus with other specified complication: Secondary | ICD-10-CM | POA: Diagnosis not present

## 2016-06-01 DIAGNOSIS — E669 Obesity, unspecified: Secondary | ICD-10-CM | POA: Diagnosis not present

## 2016-06-01 LAB — COMPREHENSIVE METABOLIC PANEL WITH GFR
ALT: 18 U/L (ref 0–35)
AST: 23 U/L (ref 0–37)
Albumin: 4.3 g/dL (ref 3.5–5.2)
Alkaline Phosphatase: 98 U/L (ref 39–117)
BUN: 16 mg/dL (ref 6–23)
CO2: 27 meq/L (ref 19–32)
Calcium: 9.9 mg/dL (ref 8.4–10.5)
Chloride: 102 meq/L (ref 96–112)
Creatinine, Ser: 0.95 mg/dL (ref 0.40–1.20)
GFR: 74.8 mL/min
Glucose, Bld: 130 mg/dL — ABNORMAL HIGH (ref 70–99)
Potassium: 4.1 meq/L (ref 3.5–5.1)
Sodium: 139 meq/L (ref 135–145)
Total Bilirubin: 0.5 mg/dL (ref 0.2–1.2)
Total Protein: 7.8 g/dL (ref 6.0–8.3)

## 2016-06-01 LAB — HEMOGLOBIN A1C: HEMOGLOBIN A1C: 6.5 % (ref 4.6–6.5)

## 2016-06-01 LAB — LIPID PANEL
CHOLESTEROL: 220 mg/dL — AB (ref 0–200)
HDL: 60.7 mg/dL (ref >39.00)
LDL CALC: 133 mg/dL — AB (ref 0–99)
NonHDL: 159.02
TRIGLYCERIDES: 131 mg/dL (ref 0.0–149.0)
Total CHOL/HDL Ratio: 4
VLDL: 26.2 mg/dL (ref 0.0–40.0)

## 2016-06-01 LAB — CBC
HEMATOCRIT: 36.7 % (ref 36.0–46.0)
HEMOGLOBIN: 12.3 g/dL (ref 12.0–15.0)
MCHC: 33.5 g/dL (ref 30.0–36.0)
MCV: 92 fl (ref 78.0–100.0)
PLATELETS: 242 10*3/uL (ref 150.0–400.0)
RBC: 3.98 Mil/uL (ref 3.87–5.11)
RDW: 14.3 % (ref 11.5–15.5)
WBC: 7.4 10*3/uL (ref 4.0–10.5)

## 2016-06-01 LAB — TSH: TSH: 2.07 u[IU]/mL (ref 0.35–4.50)

## 2016-06-01 MED ORDER — TIZANIDINE HCL 2 MG PO TABS: 2.0000 mg | ORAL_TABLET | Freq: Every evening | ORAL | 1 refills | Status: DC | PRN

## 2016-06-01 MED ORDER — ATORVASTATIN CALCIUM 40 MG PO TABS: 40.0000 mg | ORAL_TABLET | Freq: Every day | ORAL | 3 refills | Status: DC

## 2016-06-01 NOTE — Assessment & Plan Note (Signed)
Encouraged DASH diet, decrease po intake and increase exercise as tolerated. Needs 7-8 hours of sleep nightly. Avoid trans fats, eat small, frequent meals every 4-5 hours with lean proteins, complex carbs and healthy fats. Minimize simple carbs 

## 2016-06-01 NOTE — Assessment & Plan Note (Signed)
Tolerating statin, encouraged heart healthy diet, avoid trans fats, minimize simple carbs and saturated fats. Increase exercise as tolerated 

## 2016-06-01 NOTE — Progress Notes (Signed)
Patient ID: Heather Roberts, female   DOB: 07-Mar-1946, 70 y.o.   MRN: [id number]   Subjective:    Patient ID: Heather Roberts, female    DOB: July 20, 1946, 70 y.o.   MRN: [id number]  Chief Complaint  Patient presents with  . Follow-up    HPI Patient is in today for follow up. She tripped on the stairs at home last month and twisted her left ankle, jarred her left thigh and low back. Was struggling with left ankle was swelling but is better now. Low back still hurts some but is improving. Uses Meloxicam prn. Continues with lots of stress but manages most days. Denies CP/palp/SOB/HA/congestion/fevers/GI or GU c/o. Taking meds as prescribed  Past Medical History:  Diagnosis Date  . Allergic rhinitis   . Anemia 03/19/2013  . Anxiety   . Bouchard nodes (DJD hand)   . Carotid artery disease ([place]) 03/24/2014  . Cervical cancer screening 07/31/2015  . Diabetes mellitus type II   . DJD (degenerative joint disease), lumbar   . Fibroids   . Glaucoma   . History of DVT (deep vein thrombosis) 1994  . Hyperlipidemia   . Hypertension   . Insomnia 02/03/2016  . Nocturia 12/18/2012  . Obesity 07/31/2015  . Palpitations 12/18/2012    Past Surgical History:  Procedure Laterality Date  . ABDOMINAL HYSTERECTOMY    . CHOLECYSTECTOMY    . EYE SURGERY  09/2014, 08/2014   Cataracts removal  . KNEE ARTHROSCOPY      Family History  Problem Relation Age of Onset  . Stroke    . Diabetes Paternal Grandmother   . Diabetes Mother   . Kidney disease Mother   . COPD Father     smoker  . Stroke Sister   . Breast cancer Sister     s/p mastectomy  . COPD Brother     Social History   Social History  . Marital status: Married    Spouse name: Essick  . Number of children: 3  . Years of education: N/A   Occupational History  . Homemaker Retired    used to work for [employer]  . Smoking status: Former Smoker    Packs/day: 0.30    Years: 8.00     Types: Cigarettes  . Smokeless tobacco: Never Used  . Alcohol use 0.0 oz/week     Comment: Rarely---glass of wine with a meeting  . Drug use: No  . Sexual activity: Yes    Partners: Male     Comment: lives with husband, no dietary restrictions, minimizes dairy is walking more now   Other Topics Concern  . 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 amLODipine (NORVASC) 10 MG tablet Take 1 tablet (10 mg total) by mouth daily. 90 tablet 1  . aspirin 81 MG tablet Take 81 mg by mouth daily.      Marland Kitchen atorvastatin (LIPITOR) 20 MG tablet Take 1 tablet (20 mg total) by mouth daily. 90 tablet 3  . Chlorphen-Phenyleph-APAP (CORICIDIN D COLD/FLU/SINUS) 2-5-325 MG TABS Take 1 tablet by mouth as directed.    . Cholecalciferol (VITAMIN D)  1000 UNITS capsule Take 1,000 Units by mouth. ocassionally    . Cyanocobalamin (B-12) 1000 MCG CAPS Take 1 tablet by mouth daily.    . fluticasone (FLONASE) 50 MCG/ACT nasal spray Place 2 sprays into both nostrils daily as needed for allergies or rhinitis. 16 g 6  . glucose blood (GLUCOSE METER TEST) test strip To use with onetouch verio flex DX E11.9 100 each 6  . glucose blood (ONETOUCH VERIO) test strip Check blood sugar once daily.  DX E11.9 100 each 6  . losartan (COZAAR) 100 MG tablet TAKE 1 TABLET BY MOUTH EVERY DAY 30 tablet 0  . metFORMIN (GLUCOPHAGE) 500 MG tablet Take 1 tablet (500 mg total) by mouth 2 (two) times daily with a meal. 180 tablet 3  . metoprolol succinate (TOPROL-XL) 50 MG 24 hr tablet TAKE 1 TABLET BY MOUTH DAILY WITH OR IMMEDIATELY FOLLOWING A MEAL 30 tablet 0  . mometasone (NASONEX) 50 MCG/ACT nasal spray Place 2 sprays into the nose daily.    . nitroGLYCERIN (NITROSTAT) 0.4 MG SL tablet Place 0.4 mg under the tongue  every 5 (five) minutes as needed for chest pain.    . phenazopyridine (PYRIDIUM) 200 MG tablet Take 1 tablet (200 mg total) by mouth 3 (three) times daily as needed for pain. 6 tablet 0  . Turmeric 500 MG CAPS Take 1 tablet by mouth daily.    . cephALEXin (KEFLEX) 500 MG capsule Take 1 capsule (500 mg total) by mouth 3 (three) times daily. 21 capsule 0   No facility-administered medications prior to visit.     No Known Allergies  Review of Systems  Constitutional: Negative for fever and malaise/fatigue.  HENT: Negative for congestion.   Eyes: Negative for blurred vision.  Respiratory: Negative for shortness of breath.   Cardiovascular: Negative for chest pain, palpitations and leg swelling.  Gastrointestinal: Negative for abdominal pain, blood in stool and nausea.  Genitourinary: Negative for dysuria and frequency.  Musculoskeletal: Negative for back pain and falls.  Skin: Negative for rash.  Neurological: Negative for dizziness, loss of consciousness and headaches.  Endo/Heme/Allergies: Negative for environmental allergies.  Psychiatric/Behavioral: Negative for depression. The patient is not nervous/anxious.        Objective:    Physical Exam  Constitutional: She is oriented to person, place, and time. She appears well-developed and well-nourished. No distress.  HENT:  Head: Normocephalic and atraumatic.  Nose: Nose normal.  Eyes: Right eye exhibits no discharge. Left eye exhibits no discharge.  Neck: Normal range of motion. Neck supple.  Cardiovascular: Normal rate and regular rhythm.   No murmur heard. Pulmonary/Chest: Effort normal and breath sounds normal.  Abdominal: Soft. Bowel sounds are normal. There is no tenderness.  Musculoskeletal: She exhibits no edema.  Neurological: She is alert and oriented to person, place, and time.  Skin: Skin is warm and dry.  Psychiatric: She has a normal mood and affect.  Nursing note and vitals reviewed.   BP 122/80 (BP Location:  Left Arm, Patient Position: Sitting, Cuff Size: Normal)   Pulse (!) 56   Temp 98.4 F (36.9 C) (Oral)   Ht 5\' 2"  (1.575 m)   Wt 185 lb 2 oz (84 kg)   BMI 33.86 kg/m  Wt Readings from Last 3 Encounters:  06/01/16 185 lb 2 oz (84 kg)  02/03/16 186 lb 2 oz (84.4 kg)  01/01/16 185 lb 4 oz (84 kg)     Lab Results  Component Value Date   WBC 5.8 02/03/2016  HGB 13.3 02/03/2016   HCT 39.4 02/03/2016   PLT 256.0 02/03/2016   GLUCOSE 190 (H) 02/03/2016   CHOL 216 (H) 02/03/2016   TRIG 157.0 (H) 02/03/2016   HDL 58.10 02/03/2016   LDLDIRECT 118.5 10/04/2007   LDLCALC 126 (H) 02/03/2016   ALT 20 02/03/2016   AST 24 02/03/2016   NA 137 02/03/2016   K 3.8 02/03/2016   CL 101 02/03/2016   CREATININE 0.84 02/03/2016   BUN 12 02/03/2016   CO2 29 02/03/2016   TSH 1.12 02/03/2016   HGBA1C 6.9 (H) 02/03/2016   MICROALBUR 0.50 06/14/2012    Lab Results  Component Value Date   TSH 1.12 02/03/2016   Lab Results  Component Value Date   WBC 5.8 02/03/2016   HGB 13.3 02/03/2016   HCT 39.4 02/03/2016   MCV 91.9 02/03/2016   PLT 256.0 02/03/2016   Lab Results  Component Value Date   NA 137 02/03/2016   K 3.8 02/03/2016   CO2 29 02/03/2016   GLUCOSE 190 (H) 02/03/2016   BUN 12 02/03/2016   CREATININE 0.84 02/03/2016   BILITOT 0.6 02/03/2016   ALKPHOS 96 02/03/2016   AST 24 02/03/2016   ALT 20 02/03/2016   PROT 7.7 02/03/2016   ALBUMIN 4.0 02/03/2016   CALCIUM 9.8 02/03/2016   GFR 86.30 02/03/2016   Lab Results  Component Value Date   CHOL 216 (H) 02/03/2016   Lab Results  Component Value Date   HDL 58.10 02/03/2016   Lab Results  Component Value Date   LDLCALC 126 (H) 02/03/2016   Lab Results  Component Value Date   TRIG 157.0 (H) 02/03/2016   Lab Results  Component Value Date   CHOLHDL 4 02/03/2016   Lab Results  Component Value Date   HGBA1C 6.9 (H) 02/03/2016       Assessment & Plan:   Problem List Items Addressed This Visit    Diabetes  mellitus type 2 in obese ([place])    hgba1c acceptable, minimize simple carbs. Increase exercise as tolerated. Continue current meds      Relevant Orders   Hemoglobin A1c   Hyperlipidemia associated with type 2 diabetes mellitus ([place])    Tolerating statin, encouraged heart healthy diet, avoid trans fats, minimize simple carbs and saturated fats. Increase exercise as tolerated      Relevant Orders   Lipid panel   Essential hypertension    Well controlled, no changes to meds. Encouraged heart healthy diet such as the DASH diet and exercise as tolerated.       Relevant Orders   TSH   CBC   Comprehensive metabolic panel   Back pain    Encouraged moist heat and gentle stretching as tolerated. May try NSAIDs and prescription meds as directed and report if symptoms worsen or seek immediate care. Has ben using her Meloxicam with some results but then recurs. May continue and add Lidocaine patches      Relevant Medications   tiZANidine (ZANAFLEX) 2 MG tablet   Obesity    Encouraged DASH diet, decrease po intake and increase exercise as tolerated. Needs 7-8 hours of sleep nightly. Avoid trans fats, eat small, frequent meals every 4-5 hours with lean proteins, complex carbs and healthy fats. Minimize simple carbs       Other Visit Diagnoses   None.     I have discontinued Ms. Watkins Williams's cephALEXin. I am also having her start on tiZANidine. Additionally, I am having her maintain her aspirin, Vitamin  D, fluticasone, losartan, Turmeric, B-12, atorvastatin, glucose blood, glucose blood, nitroGLYCERIN, Chlorphen-Phenyleph-APAP, mometasone, phenazopyridine, amLODipine, metFORMIN, and metoprolol succinate.  Meds ordered this encounter  Medications  . tiZANidine (ZANAFLEX) 2 MG tablet    Sig: Take 1 tablet (2 mg total) by mouth at bedtime as needed for muscle spasms.    Dispense:  30 tablet    Refill:  1     [name], MD

## 2016-06-01 NOTE — Assessment & Plan Note (Signed)
Well controlled, no changes to meds. Encouraged heart healthy diet such as the DASH diet and exercise as tolerated.  °

## 2016-06-01 NOTE — Assessment & Plan Note (Addendum)
Encouraged moist heat and gentle stretching as tolerated. May try NSAIDs and prescription meds as directed and report if symptoms worsen or seek immediate care. Has ben using her Meloxicam with some results but then recurs. May continue and add Lidocaine patches.

## 2016-06-01 NOTE — Progress Notes (Signed)
Pre visit review using our clinic review tool, if applicable. No additional management support is needed unless otherwise documented below in the visit note. 

## 2016-06-01 NOTE — Patient Instructions (Signed)
Consider NOW company probiotics and add Benefiber and increase fluids.  Back Pain, Adult Back pain is very common in adults.The cause of back pain is rarely dangerous and the pain often gets better over time.The cause of your back pain may not be known. Some common causes of back pain include:  Strain of the muscles or ligaments supporting the spine.  Wear and tear (degeneration) of the spinal disks.  Arthritis.  Direct injury to the back. For many people, back pain may return. Since back pain is rarely dangerous, most people can learn to manage this condition on their own. HOME CARE INSTRUCTIONS Watch your back pain for any changes. The following actions may help to lessen any discomfort you are feeling:  Remain active. It is stressful on your back to sit or stand in one place for long periods of time. Do not sit, drive, or stand in one place for more than 30 minutes at a time. Take short walks on even surfaces as soon as you are able.Try to increase the length of time you walk each day.  Exercise regularly as directed by your health care provider. Exercise helps your back heal faster. It also helps avoid future injury by keeping your muscles strong and flexible.  Do not stay in bed.Resting more than 1-2 days can delay your recovery.  Pay attention to your body when you bend and lift. The most comfortable positions are those that put less stress on your recovering back. Always use proper lifting techniques, including:  Bending your knees.  Keeping the load close to your body.  Avoiding twisting.  Find a comfortable position to sleep. Use a firm mattress and lie on your side with your knees slightly bent. If you lie on your back, put a pillow under your knees.  Avoid feeling anxious or stressed.Stress increases muscle tension and can worsen back pain.It is important to recognize when you are anxious or stressed and learn ways to manage it, such as with exercise.  Take medicines  only as directed by your health care provider. Over-the-counter medicines to reduce pain and inflammation are often the most helpful.Your health care provider may prescribe muscle relaxant drugs.These medicines help dull your pain so you can more quickly return to your normal activities and healthy exercise.  Apply ice to the injured area:  Put ice in a plastic bag.  Place a towel between your skin and the bag.  Leave the ice on for 20 minutes, 2-3 times a day for the first 2-3 days. After that, ice and heat may be alternated to reduce pain and spasms.  Maintain a healthy weight. Excess weight puts extra stress on your back and makes it difficult to maintain good posture. SEEK MEDICAL CARE IF:  You have pain that is not relieved with rest or medicine.  You have increasing pain going down into the legs or buttocks.  You have pain that does not improve in one week.  You have night pain.  You lose weight.  You have a fever or chills. SEEK IMMEDIATE MEDICAL CARE IF:   You develop new bowel or bladder control problems.  You have unusual weakness or numbness in your arms or legs.  You develop nausea or vomiting.  You develop abdominal pain.  You feel faint.   This information is not intended to replace advice given to you by your health care provider. Make sure you discuss any questions you have with your health care provider.   Document Released: 10/04/2005 Document  Revised: 10/25/2014 Document Reviewed: 02/05/2014 Elsevier Interactive Patient Education [employer].

## 2016-06-01 NOTE — Assessment & Plan Note (Signed)
hgba1c acceptable, minimize simple carbs. Increase exercise as tolerated. Continue current meds 

## 2016-06-28 ENCOUNTER — Other Ambulatory Visit: Payer: Self-pay | Admitting: Family Medicine

## 2016-07-08 ENCOUNTER — Ambulatory Visit (INDEPENDENT_AMBULATORY_CARE_PROVIDER_SITE_OTHER): Payer: Medicare Other | Admitting: Behavioral Health

## 2016-07-08 DIAGNOSIS — Z23 Encounter for immunization: Secondary | ICD-10-CM

## 2016-07-08 NOTE — Progress Notes (Addendum)
Pre visit review using our clinic review tool, if applicable. No additional management support is needed unless otherwise documented below in the visit note.  Patient in clinic today for Influenza vaccination. IM given in Left Deltoid. Patient tolerated injection well.

## 2016-07-14 ENCOUNTER — Other Ambulatory Visit: Payer: Self-pay | Admitting: Family Medicine

## 2016-07-27 ENCOUNTER — Other Ambulatory Visit: Payer: Self-pay | Admitting: Family Medicine

## 2016-08-23 ENCOUNTER — Other Ambulatory Visit: Payer: Self-pay | Admitting: Family Medicine

## 2016-08-26 ENCOUNTER — Other Ambulatory Visit: Payer: Self-pay | Admitting: Family Medicine

## 2016-09-02 ENCOUNTER — Ambulatory Visit (INDEPENDENT_AMBULATORY_CARE_PROVIDER_SITE_OTHER): Payer: Medicare Other | Admitting: Family Medicine

## 2016-09-02 ENCOUNTER — Other Ambulatory Visit: Payer: Self-pay | Admitting: Family Medicine

## 2016-09-02 VITALS — BP 118/64 | HR 62 | Temp 98.1°F | Wt 185.8 lb

## 2016-09-02 DIAGNOSIS — Z0000 Encounter for general adult medical examination without abnormal findings: Secondary | ICD-10-CM

## 2016-09-02 DIAGNOSIS — Z23 Encounter for immunization: Secondary | ICD-10-CM

## 2016-09-02 DIAGNOSIS — M25552 Pain in left hip: Secondary | ICD-10-CM | POA: Diagnosis not present

## 2016-09-02 DIAGNOSIS — E785 Hyperlipidemia, unspecified: Secondary | ICD-10-CM | POA: Diagnosis not present

## 2016-09-02 DIAGNOSIS — E1169 Type 2 diabetes mellitus with other specified complication: Secondary | ICD-10-CM

## 2016-09-02 DIAGNOSIS — M47899 Other spondylosis, site unspecified: Secondary | ICD-10-CM

## 2016-09-02 DIAGNOSIS — E875 Hyperkalemia: Secondary | ICD-10-CM

## 2016-09-02 DIAGNOSIS — I10 Essential (primary) hypertension: Secondary | ICD-10-CM

## 2016-09-02 DIAGNOSIS — K625 Hemorrhage of anus and rectum: Secondary | ICD-10-CM

## 2016-09-02 LAB — COMPREHENSIVE METABOLIC PANEL
ALBUMIN: 4.2 g/dL (ref 3.5–5.2)
ALT: 15 U/L (ref 0–35)
AST: 19 U/L (ref 0–37)
Alkaline Phosphatase: 111 U/L (ref 39–117)
BUN: 30 mg/dL — ABNORMAL HIGH (ref 6–23)
CALCIUM: 10.2 mg/dL (ref 8.4–10.5)
CHLORIDE: 104 meq/L (ref 96–112)
CO2: 28 meq/L (ref 19–32)
CREATININE: 1.29 mg/dL — AB (ref 0.40–1.20)
GFR: 52.51 mL/min — AB (ref >60.00)
Glucose, Bld: 164 mg/dL — ABNORMAL HIGH (ref 70–99)
POTASSIUM: 5.4 meq/L — AB (ref 3.5–5.1)
Sodium: 141 mEq/L (ref 135–145)
Total Bilirubin: 0.6 mg/dL (ref 0.2–1.2)
Total Protein: 7.6 g/dL (ref 6.0–8.3)

## 2016-09-02 LAB — CBC
HEMATOCRIT: 36.1 % (ref 36.0–46.0)
Hemoglobin: 11.9 g/dL — ABNORMAL LOW (ref 12.0–15.0)
MCHC: 32.9 g/dL (ref 30.0–36.0)
MCV: 92.7 fl (ref 78.0–100.0)
PLATELETS: 258 10*3/uL (ref 150.0–400.0)
RBC: 3.9 Mil/uL (ref 3.87–5.11)
RDW: 14.6 % (ref 11.5–15.5)
WBC: 6.2 10*3/uL (ref 4.0–10.5)

## 2016-09-02 LAB — HEPATITIS C ANTIBODY: HCV Ab: NEGATIVE

## 2016-09-02 LAB — LIPID PANEL
CHOL/HDL RATIO: 3
CHOLESTEROL: 156 mg/dL (ref 0–200)
HDL: 54.3 mg/dL (ref >39.00)
LDL CALC: 77 mg/dL (ref 0–99)
NonHDL: 101.29
TRIGLYCERIDES: 119 mg/dL (ref 0.0–149.0)
VLDL: 23.8 mg/dL (ref 0.0–40.0)

## 2016-09-02 LAB — HEMOGLOBIN A1C: Hgb A1c MFr Bld: 6.8 % — ABNORMAL HIGH (ref 4.6–6.5)

## 2016-09-02 LAB — TSH: TSH: 1.36 u[IU]/mL (ref 0.35–4.50)

## 2016-09-02 MED ORDER — METHOCARBAMOL 500 MG PO TABS: 500.0000 mg | ORAL_TABLET | Freq: Three times a day (TID) | ORAL | 1 refills | Status: DC | PRN

## 2016-09-02 NOTE — Assessment & Plan Note (Signed)
Well controlled, no changes to meds. Encouraged heart healthy diet such as the DASH diet and exercise as tolerated.  °

## 2016-09-02 NOTE — Progress Notes (Signed)
Pre visit review using our clinic review tool, if applicable. No additional management support is needed unless otherwise documented below in the visit note. 

## 2016-09-02 NOTE — Assessment & Plan Note (Addendum)
Encouraged moist heat and gentle stretching as tolerated. May try NSAIDs and prescription meds as directed and report if symptoms worsen or seek immediate care. Is struggling with heavy lifting of her daughter who had surgery in September. She has episodes of pain and stiffness in her back that is sometimes bad enough that her legs hurt and feel weak. It is better today. Will refer to Sports med for further consideration, Tizanidine 2 mg dose. Will have her add Lidocaine patches and try Robaxin

## 2016-09-02 NOTE — Assessment & Plan Note (Signed)
Tolerating statin, encouraged heart healthy diet, avoid trans fats, minimize simple carbs and saturated fats. Increase exercise as tolerated 

## 2016-09-02 NOTE — Patient Instructions (Addendum)
Moist heat and lidocaine patches to hip or back Back Pain, Adult Introduction Back pain is very common. The pain often gets better over time. The cause of back pain is usually not dangerous. Most people can learn to manage their back pain on their own. Follow these instructions at home: Watch your back pain for any changes. The following actions may help to lessen any pain you are feeling:  Stay active. Start with short walks on flat ground if you can. Try to walk farther each day.  Exercise regularly as told by your doctor. Exercise helps your back heal faster. It also helps avoid future injury by keeping your muscles strong and flexible.  Do not sit, drive, or stand in one place for more than 30 minutes.  Do not stay in bed. Resting more than 1-2 days can slow down your recovery.  Be careful when you bend or lift an object. Use good form when lifting:  Bend at your knees.  Keep the object close to your body.  Do not twist.  Sleep on a firm mattress. Lie on your side, and bend your knees. If you lie on your back, put a pillow under your knees.  Take medicines only as told by your doctor.  Put ice on the injured area.  Put ice in a plastic bag.  Place a towel between your skin and the bag.  Leave the ice on for 20 minutes, 2-3 times a day for the first 2-3 days. After that, you can switch between ice and heat packs.  Avoid feeling anxious or stressed. Find good ways to deal with stress, such as exercise.  Maintain a healthy weight. Extra weight puts stress on your back. Contact a doctor if:  You have pain that does not go away with rest or medicine.  You have worsening pain that goes down into your legs or buttocks.  You have pain that does not get better in one week.  You have pain at night.  You lose weight.  You have a fever or chills. Get help right away if:  You cannot control when you poop (bowel movement) or pee (urinate).  Your arms or legs feel  weak.  Your arms or legs lose feeling (numbness).  You feel sick to your stomach (nauseous) or throw up (vomit).  You have belly (abdominal) pain.  You feel like you may pass out (faint). This information is not intended to replace advice given to you by your health care provider. Make sure you discuss any questions you have with your health care provider. Document Released: 03/22/2008 Document Revised: 03/11/2016 Document Reviewed: 02/05/2014  2017 Elsevier

## 2016-09-02 NOTE — Progress Notes (Signed)
Patient ID: Renly Bertholf, female   DOB: March 28, 1946, 70 y.o.   MRN: [id number] .   Subjective:    Patient ID: Jazmon Lowing, female    DOB: [birth year], 70 y.o.   MRN: [id number]  No chief complaint on file.   HPI Patient is in today for follow up. She is noting some increased knee pain b/l also notes some pain in knees with walking. Also notes some trouble with consipation, is having subsequent pain in abdomen. No nausea or vomiting. No bloody or tarry stool. Denies CP/palp/SOB/HA/congestion/fevers or GU c/o. Taking meds as prescribed  Past Medical History:  Diagnosis Date  . Allergic rhinitis   . Anemia 03/19/2013  . Anxiety   . Bouchard nodes (DJD hand)   . Carotid artery disease ([place]) 03/24/2014  . Cervical cancer screening 07/31/2015  . Diabetes mellitus type II   . DJD (degenerative joint disease), lumbar   . Fibroids   . Glaucoma   . History of DVT (deep vein thrombosis) 1994  . Hyperlipidemia   . Hypertension   . Insomnia 02/03/2016  . Nocturia 12/18/2012  . Obesity 07/31/2015  . Palpitations 12/18/2012    Past Surgical History:  Procedure Laterality Date  . ABDOMINAL HYSTERECTOMY    . CHOLECYSTECTOMY    . EYE SURGERY  09/2014, 08/2014   Cataracts removal  . KNEE ARTHROSCOPY      Family History  Problem Relation Age of Onset  . Stroke    . Diabetes Paternal Grandmother   . Diabetes Mother   . Kidney disease Mother   . COPD Father     smoker  . Stroke Sister   . Breast cancer Sister     s/p mastectomy  . COPD Brother     Social History   Social History  . Marital status: Married    Spouse name: Essick  . Number of children: 3  . Years of education: N/A   Occupational History  . Homemaker Retired    used to work for [employer]  . Smoking status: Former Smoker    Packs/day: 0.30    Years: 8.00    Types: Cigarettes  . Smokeless tobacco: Never Used  . Alcohol use 0.0 oz/week     Comment:  Rarely---glass of wine with a meeting  . Drug use: No  . Sexual activity: Yes    Partners: Male     Comment: lives with husband, no dietary restrictions, minimizes dairy is walking more now   Other Topics Concern  . 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 amLODipine (NORVASC) 10 MG tablet Take 1 tablet (10 mg total) by mouth daily. 90 tablet 1  . aspirin 81 MG tablet Take 81 mg by mouth daily.      Marland Kitchen atorvastatin (LIPITOR) 40 MG tablet Take 1 tablet (40 mg total) by mouth daily. 30 tablet 3  . Chlorphen-Phenyleph-APAP (CORICIDIN D COLD/FLU/SINUS) 2-5-325 MG TABS Take 1 tablet by mouth as directed.    . Cholecalciferol (VITAMIN D) 1000 UNITS capsule Take 1,000 Units by mouth. ocassionally    . Cyanocobalamin (B-12) 1000 MCG CAPS Take  1 tablet by mouth daily.    . fluticasone (FLONASE) 50 MCG/ACT nasal spray Place 2 sprays into both nostrils daily as needed for allergies or rhinitis. 16 g 6  . glucose blood (GLUCOSE METER TEST) test strip To use with onetouch verio flex DX E11.9 100 each 6  . glucose blood (ONETOUCH VERIO) test strip Check blood sugar once daily.  DX E11.9 100 each 6  . losartan (COZAAR) 100 MG tablet TAKE 1 TABLET BY MOUTH EVERY DAY 30 tablet 0  . meloxicam (MOBIC) 15 MG tablet TAKE 1 TABLET(15 MG) BY MOUTH DAILY 30 tablet 1  . metFORMIN (GLUCOPHAGE) 500 MG tablet Take 1 tablet (500 mg total) by mouth 2 (two) times daily with a meal. 180 tablet 3  . metoprolol succinate (TOPROL-XL) 50 MG 24 hr tablet TAKE 1 TABLET BY MOUTH DAILY WITH OR IMMEDIATELY FOLLOWING A MEAL 30 tablet 0  . mometasone (NASONEX) 50 MCG/ACT nasal spray Place 2 sprays into the nose daily.    . nitroGLYCERIN (NITROSTAT) 0.4 MG SL tablet Place 0.4 mg under the tongue every 5 (five)  minutes as needed for chest pain.    . phenazopyridine (PYRIDIUM) 200 MG tablet Take 1 tablet (200 mg total) by mouth 3 (three) times daily as needed for pain. 6 tablet 0  . Turmeric 500 MG CAPS Take 1 tablet by mouth daily.    Marland Kitchen tiZANidine (ZANAFLEX) 2 MG tablet TAKE 1 TABLET(2 MG) BY MOUTH AT BEDTIME AS NEEDED FOR MUSCLE SPASMS (Patient not taking: Reported on 09/02/2016) 30 tablet 0   No facility-administered medications prior to visit.     No Known Allergies  Review of Systems  Constitutional: Positive for malaise/fatigue. Negative for fever.  HENT: Negative for congestion.   Eyes: Negative for blurred vision.  Respiratory: Negative for shortness of breath.   Cardiovascular: Negative for chest pain, palpitations and leg swelling.  Gastrointestinal: Negative for abdominal pain, blood in stool and nausea.  Genitourinary: Negative for dysuria and frequency.  Musculoskeletal: Positive for back pain, joint pain and myalgias. Negative for falls.  Skin: Negative for rash.  Neurological: Negative for dizziness, loss of consciousness and headaches.  Endo/Heme/Allergies: Negative for environmental allergies.  Psychiatric/Behavioral: Negative for depression. The patient is not nervous/anxious.        Objective:    Physical Exam  Constitutional: She is oriented to person, place, and time. She appears well-developed and well-nourished. No distress.  HENT:  Head: Normocephalic and atraumatic.  Nose: Nose normal.  Eyes: Right eye exhibits no discharge. Left eye exhibits no discharge.  Neck: Normal range of motion. Neck supple.  Cardiovascular: Normal rate and regular rhythm.   No murmur heard. Pulmonary/Chest: Effort normal and breath sounds normal.  Abdominal: Soft. Bowel sounds are normal. There is no tenderness.  Musculoskeletal: She exhibits tenderness. She exhibits no edema.  Pain with palp left posterior sacroiliac joint.   Neurological: She is alert and oriented to person, place,  and time.  Skin: Skin is warm and dry.  Psychiatric: She has a normal mood and affect.  Nursing note and vitals reviewed.   BP 118/64 (BP Location: Left Arm, Patient Position: Sitting, Cuff Size: Normal)   Pulse 62   Temp 98.1 F (36.7 C) (Oral)   Wt 185 lb 12.8 oz (84.3 kg)   SpO2 98%   BMI 33.98 kg/m  Wt Readings from Last 3 Encounters:  09/02/16 185 lb 12.8 oz (84.3 kg)  06/01/16 185 lb 2 oz (84 kg)  02/03/16 186 lb  2 oz (84.4 kg)     Lab Results  Component Value Date   WBC 7.4 06/01/2016   HGB 12.3 06/01/2016   HCT 36.7 06/01/2016   PLT 242.0 06/01/2016   GLUCOSE 130 (H) 06/01/2016   CHOL 220 (H) 06/01/2016   TRIG 131.0 06/01/2016   HDL 60.70 06/01/2016   LDLDIRECT 118.5 10/04/2007   LDLCALC 133 (H) 06/01/2016   ALT 18 06/01/2016   AST 23 06/01/2016   NA 139 06/01/2016   K 4.1 06/01/2016   CL 102 06/01/2016   CREATININE 0.95 06/01/2016   BUN 16 06/01/2016   CO2 27 06/01/2016   TSH 2.07 06/01/2016   HGBA1C 6.5 06/01/2016   MICROALBUR 0.50 06/14/2012    Lab Results  Component Value Date   TSH 2.07 06/01/2016   Lab Results  Component Value Date   WBC 7.4 06/01/2016   HGB 12.3 06/01/2016   HCT 36.7 06/01/2016   MCV 92.0 06/01/2016   PLT 242.0 06/01/2016   Lab Results  Component Value Date   NA 139 06/01/2016   K 4.1 06/01/2016   CO2 27 06/01/2016   GLUCOSE 130 (H) 06/01/2016   BUN 16 06/01/2016   CREATININE 0.95 06/01/2016   BILITOT 0.5 06/01/2016   ALKPHOS 98 06/01/2016   AST 23 06/01/2016   ALT 18 06/01/2016   PROT 7.8 06/01/2016   ALBUMIN 4.3 06/01/2016   CALCIUM 9.9 06/01/2016   GFR 74.80 06/01/2016   Lab Results  Component Value Date   CHOL 220 (H) 06/01/2016   Lab Results  Component Value Date   HDL 60.70 06/01/2016   Lab Results  Component Value Date   LDLCALC 133 (H) 06/01/2016   Lab Results  Component Value Date   TRIG 131.0 06/01/2016   Lab Results  Component Value Date   CHOLHDL 4 06/01/2016   Lab Results    Component Value Date   HGBA1C 6.5 06/01/2016       Assessment & Plan:   Problem List Items Addressed This Visit    Hyperlipidemia associated with type 2 diabetes mellitus ([place])    Tolerating statin, encouraged heart healthy diet, avoid trans fats, minimize simple carbs and saturated fats. Increase exercise as tolerated      Relevant Orders   Hemoglobin A1c   Lipid panel   Essential hypertension    Well controlled, no changes to meds. Encouraged heart healthy diet such as the DASH diet and exercise as tolerated.       Relevant Orders   CBC   Comprehensive metabolic panel   TSH   Osteoarthritis of spine    Encouraged moist heat and gentle stretching as tolerated. May try NSAIDs and prescription meds as directed and report if symptoms worsen or seek immediate care. Is struggling with heavy lifting of her daughter who had surgery in September. She has episodes of pain and stiffness in her back that is sometimes bad enough that her legs hurt and feel weak. It is better today. Will refer to Sports med for further consideration, Tizanidine 2 mg dose. Will have her add Lidocaine patches and try Robaxin      Relevant Medications   methocarbamol (ROBAXIN) 500 MG tablet   Other Relevant Orders   Lipid panel   Ambulatory referral to Sports Medicine    Other Visit Diagnoses    Preventative health care    -  Primary   Relevant Orders   Hepatitis C Antibody   Hip pain, acute, left  Relevant Orders   Ambulatory referral to Sports Medicine      I have discontinued Ms. Watkins Williams's tiZANidine. I am also having her start on methocarbamol. Additionally, I am having her maintain her aspirin, Vitamin D, fluticasone, losartan, Turmeric, B-12, glucose blood, glucose blood, nitroGLYCERIN, Chlorphen-Phenyleph-APAP, mometasone, phenazopyridine, amLODipine, metFORMIN, atorvastatin, meloxicam, and metoprolol succinate.  Meds ordered this encounter  Medications  . methocarbamol  (ROBAXIN) 500 MG tablet    Sig: Take 1 tablet (500 mg total) by mouth every 8 (eight) hours as needed for muscle spasms.    Dispense:  30 tablet    Refill:  1     [name], MD

## 2016-09-06 ENCOUNTER — Other Ambulatory Visit (INDEPENDENT_AMBULATORY_CARE_PROVIDER_SITE_OTHER): Payer: Medicare Other

## 2016-09-06 ENCOUNTER — Other Ambulatory Visit: Payer: Medicare Other

## 2016-09-06 ENCOUNTER — Ambulatory Visit (HOSPITAL_BASED_OUTPATIENT_CLINIC_OR_DEPARTMENT_OTHER)
Admission: RE | Admit: 2016-09-06 | Discharge: 2016-09-06 | Disposition: A | Discharge status: Home / Self Care | Payer: Medicare Other | Source: Ambulatory Visit | Attending: Family Medicine | Admitting: Family Medicine

## 2016-09-06 ENCOUNTER — Encounter: Payer: Self-pay | Admitting: Family Medicine

## 2016-09-06 ENCOUNTER — Ambulatory Visit (INDEPENDENT_AMBULATORY_CARE_PROVIDER_SITE_OTHER): Payer: Medicare Other | Admitting: Family Medicine

## 2016-09-06 VITALS — BP 101/68 | HR 137 | Ht 62.0 in | Wt 183.0 lb

## 2016-09-06 DIAGNOSIS — M47896 Other spondylosis, lumbar region: Secondary | ICD-10-CM | POA: Diagnosis not present

## 2016-09-06 DIAGNOSIS — M545 Low back pain, unspecified: Secondary | ICD-10-CM

## 2016-09-06 DIAGNOSIS — E875 Hyperkalemia: Secondary | ICD-10-CM

## 2016-09-06 DIAGNOSIS — M5136 Other intervertebral disc degeneration, lumbar region: Secondary | ICD-10-CM | POA: Diagnosis not present

## 2016-09-06 LAB — COMPREHENSIVE METABOLIC PANEL
ALK PHOS: 102 U/L (ref 39–117)
ALT: 15 U/L (ref 0–35)
AST: 19 U/L (ref 0–37)
Albumin: 4.1 g/dL (ref 3.5–5.2)
BILIRUBIN TOTAL: 0.6 mg/dL (ref 0.2–1.2)
BUN: 18 mg/dL (ref 6–23)
CO2: 24 meq/L (ref 19–32)
Calcium: 9.6 mg/dL (ref 8.4–10.5)
Chloride: 105 mEq/L (ref 96–112)
Creatinine, Ser: 1.16 mg/dL (ref 0.40–1.20)
GFR: 59.36 mL/min — AB (ref >60.00)
GLUCOSE: 119 mg/dL — AB (ref 70–99)
Potassium: 4.5 mEq/L (ref 3.5–5.1)
SODIUM: 138 meq/L (ref 135–145)
TOTAL PROTEIN: 7.5 g/dL (ref 6.0–8.3)

## 2016-09-06 NOTE — Patient Instructions (Signed)
You have arthritis of your low back - likely a little muscle strain where these insert on your spine also. These are the different medicines you can take for this: Glucosamine sulfate 750mg  twice a day is a supplement that may help. Capsaicin, aspercreme, or biofreeze topically up to four times a day may also help with pain. Advil or aleve with food for pain and inflammation Tizanidine as needed for spasms. Do home exercises as directed - pick 3-5 on the handout and do daily. Try to identify the exercises (like high leg kicks, tire runs) that seem to bother your back - cut these out for now but after a few weeks add one back each week. Consider physical therapy. Strengthening of low back muscles, abdominal musculature are key for long term pain relief. If not improving, will consider further imaging (MRI). Follow up with me in 5-6 weeks.

## 2016-09-07 NOTE — Progress Notes (Signed)
PCP and consultation requested by: [name], MD  Subjective:   HPI: Patient is a 70 y.o. female here for low back pain.  Patient report she started to get pain in low back on 10/28. Started when she was at a [employer] - no injury or trauma just started getting pain central low back. Pain is 0/10 at rest but can get up to 7/10 and stabbing. She is taking tizanidine on active days which helps some. Not taking any other medicines. Does stretching in morning and an army based exercise program (husband was in the [employer]). No radiation. No numbness or tingling with this. No bowel/bladder dysfunction.  Past Medical History:  Diagnosis Date  . Allergic rhinitis   . Anemia 03/19/2013  . Anxiety   . Bouchard nodes (DJD hand)   . Carotid artery disease ([place]) 03/24/2014  . Cervical cancer screening 07/31/2015  . Diabetes mellitus type II   . DJD (degenerative joint disease), lumbar   . Fibroids   . Glaucoma   . History of DVT (deep vein thrombosis) 1994  . Hyperlipidemia   . Hypertension   . Insomnia 02/03/2016  . Nocturia 12/18/2012  . Obesity 07/31/2015  . Palpitations 12/18/2012    Current Outpatient Prescriptions on File Prior to Visit  Medication Sig Dispense Refill  . amLODipine (NORVASC) 10 MG tablet Take 1 tablet (10 mg total) by mouth daily. 90 tablet 1  . aspirin 81 MG tablet Take 81 mg by mouth daily.      Marland Kitchen atorvastatin (LIPITOR) 40 MG tablet Take 1 tablet (40 mg total) by mouth daily. 30 tablet 3  . Chlorphen-Phenyleph-APAP (CORICIDIN D COLD/FLU/SINUS) 2-5-325 MG TABS Take 1 tablet by mouth as directed.    . Cholecalciferol (VITAMIN D) 1000 UNITS capsule Take 1,000 Units by mouth. ocassionally    . Cyanocobalamin (B-12) 1000 MCG CAPS Take 1 tablet by mouth daily.    . fluticasone (FLONASE) 50 MCG/ACT nasal spray Place 2 sprays into both nostrils daily as needed for allergies or rhinitis. 16 g 6  . glucose blood (GLUCOSE METER TEST) test strip To use with onetouch  verio flex DX E11.9 100 each 6  . glucose blood (ONETOUCH VERIO) test strip Check blood sugar once daily.  DX E11.9 100 each 6  . losartan (COZAAR) 100 MG tablet TAKE 1 TABLET BY MOUTH EVERY DAY 30 tablet 0  . meloxicam (MOBIC) 15 MG tablet TAKE 1 TABLET(15 MG) BY MOUTH DAILY 30 tablet 1  . metFORMIN (GLUCOPHAGE) 500 MG tablet Take 1 tablet (500 mg total) by mouth 2 (two) times daily with a meal. 180 tablet 3  . methocarbamol (ROBAXIN) 500 MG tablet Take 1 tablet (500 mg total) by mouth every 8 (eight) hours as needed for muscle spasms. 30 tablet 1  . metoprolol succinate (TOPROL-XL) 50 MG 24 hr tablet TAKE 1 TABLET BY MOUTH DAILY WITH OR IMMEDIATELY FOLLOWING A MEAL 30 tablet 0  . mometasone (NASONEX) 50 MCG/ACT nasal spray Place 2 sprays into the nose daily.    . nitroGLYCERIN (NITROSTAT) 0.4 MG SL tablet Place 0.4 mg under the tongue every 5 (five) minutes as needed for chest pain.    . phenazopyridine (PYRIDIUM) 200 MG tablet Take 1 tablet (200 mg total) by mouth 3 (three) times daily as needed for pain. 6 tablet 0  . Turmeric 500 MG CAPS Take 1 tablet by mouth daily.     No current facility-administered medications on file prior to visit.     Past Surgical  History:  Procedure Laterality Date  . ABDOMINAL HYSTERECTOMY    . CHOLECYSTECTOMY    . EYE SURGERY  09/2014, 08/2014   Cataracts removal  . KNEE ARTHROSCOPY      No Known Allergies  Social History   Social History  . Marital status: Married    Spouse name: Essick  . Number of children: 3  . Years of education: N/A   Occupational History  . Homemaker Retired    used to work for [employer]  . Smoking status: Former Smoker    Packs/day: 0.30    Years: 8.00    Types: Cigarettes  . Smokeless tobacco: Never Used  . Alcohol use 0.0 oz/week     Comment: Rarely---glass of wine with a meeting  . Drug use: No  . Sexual activity: Yes    Partners: Male     Comment: lives with  husband, no dietary restrictions, minimizes dairy is walking more now   Other Topics Concern  . 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 Stroke    . Diabetes Paternal Grandmother   . Diabetes Mother   . Kidney disease Mother   . COPD Father     smoker  . Stroke Sister   . Breast cancer Sister     s/p mastectomy  . COPD Brother     BP 101/68   Pulse (!) 137   Ht 5\' 2"  (1.575 m)   Wt 183 lb (83 kg)   BMI 33.47 kg/m   Review of Systems: See HPI above.     Objective:  Physical Exam:  Gen: NAD, comfortable in exam room  Back: No gross deformity, scoliosis. No TTP .  No midline or bony TTP. FROM. Strength LEs 5/5 all muscle groups.   2+ MSRs in patellar and achilles tendons, equal bilaterally. Negative SLRs. Sensation intact to light touch bilaterally. Negative logroll bilateral hips Negative fabers and piriformis stretches.   Assessment & Plan:  1. Low back pain - independently reviewed radiographs - severe DDD at L3-4 and L4-5 levels especially.  Her exam is reassuring, benign.  We discussed possibility of postural muscle strain as well.  We discussed glucosamine, tizanidine, topical medications, nsaids.  I am concerned she may be overdoing exercises - we reviewed ones that may be aggravating her pain (feels stiffness after tire runs, high leg kicks for example) - to focus on stretches we provided her and work way back into her usual workout one exercise at a time.  F/u in 5-6 weeks.

## 2016-09-08 NOTE — Assessment & Plan Note (Signed)
independently reviewed radiographs - severe DDD at L3-4 and L4-5 levels especially.  Her exam is reassuring, benign.  We discussed possibility of postural muscle strain as well.  We discussed glucosamine, tizanidine, topical medications, nsaids.  I am concerned she may be overdoing exercises - we reviewed ones that may be aggravating her pain (feels stiffness after tire runs, high leg kicks for example) - to focus on stretches we provided her and work way back into her usual workout one exercise at a time.  F/u in 5-6 weeks.

## 2016-09-11 ENCOUNTER — Other Ambulatory Visit: Payer: Self-pay | Admitting: Family Medicine

## 2016-09-19 ENCOUNTER — Other Ambulatory Visit: Payer: Self-pay | Admitting: Family Medicine

## 2016-09-30 ENCOUNTER — Other Ambulatory Visit (INDEPENDENT_AMBULATORY_CARE_PROVIDER_SITE_OTHER): Payer: Medicare Other

## 2016-09-30 ENCOUNTER — Telehealth: Payer: Self-pay | Admitting: Family Medicine

## 2016-09-30 DIAGNOSIS — K625 Hemorrhage of anus and rectum: Secondary | ICD-10-CM

## 2016-09-30 LAB — FECAL OCCULT BLOOD, IMMUNOCHEMICAL: Fecal Occult Bld: NEGATIVE

## 2016-09-30 NOTE — Telephone Encounter (Signed)
The patient is not scheduled to see Dr. Barbaraann Barthel until  10/20/2016 States her back has really been bothering her.  She is regularly taking the tizanidine but does not seem to be helping.  Advise please. Call before 10 am in the morning with response.

## 2016-09-30 NOTE — Telephone Encounter (Signed)
Have her keep taking the Meloxicam with the Tizanidine and give her an rx for Tramadol 50  Mg tab 1 tab po tid prn pain disp #40 to  use prn for painand warn her there is a chance it will make her sleepy

## 2016-10-01 ENCOUNTER — Other Ambulatory Visit: Payer: Self-pay | Admitting: Family Medicine

## 2016-10-01 MED ORDER — TRAMADOL HCL 50 MG PO TABS: 50.0000 mg | ORAL_TABLET | Freq: Three times a day (TID) | ORAL | 0 refills | Status: DC | PRN

## 2016-10-01 NOTE — Telephone Encounter (Signed)
Faxed hardcopy to [facility] [address] [place] After speaking to the patient did inform of pcp instructions

## 2016-10-01 NOTE — Telephone Encounter (Signed)
Printed and will fax to [facility].

## 2016-10-17 ENCOUNTER — Emergency Department (HOSPITAL_BASED_OUTPATIENT_CLINIC_OR_DEPARTMENT_OTHER)
Admission: EM | Admit: 2016-10-17 | Discharge: 2016-10-17 | Disposition: A | Discharge status: Home / Self Care | Payer: Medicare Other | Attending: Emergency Medicine | Admitting: Emergency Medicine

## 2016-10-17 ENCOUNTER — Encounter (HOSPITAL_BASED_OUTPATIENT_CLINIC_OR_DEPARTMENT_OTHER): Payer: Self-pay | Admitting: *Deleted

## 2016-10-17 DIAGNOSIS — E119 Type 2 diabetes mellitus without complications: Secondary | ICD-10-CM | POA: Diagnosis not present

## 2016-10-17 DIAGNOSIS — Z7982 Long term (current) use of aspirin: Secondary | ICD-10-CM | POA: Insufficient documentation

## 2016-10-17 DIAGNOSIS — Z7984 Long term (current) use of oral hypoglycemic drugs: Secondary | ICD-10-CM | POA: Diagnosis not present

## 2016-10-17 DIAGNOSIS — Z87891 Personal history of nicotine dependence: Secondary | ICD-10-CM | POA: Insufficient documentation

## 2016-10-17 DIAGNOSIS — J069 Acute upper respiratory infection, unspecified: Secondary | ICD-10-CM

## 2016-10-17 DIAGNOSIS — Z79899 Other long term (current) drug therapy: Secondary | ICD-10-CM | POA: Insufficient documentation

## 2016-10-17 DIAGNOSIS — R05 Cough: Secondary | ICD-10-CM | POA: Diagnosis present

## 2016-10-17 DIAGNOSIS — I10 Essential (primary) hypertension: Secondary | ICD-10-CM | POA: Insufficient documentation

## 2016-10-17 MED ORDER — HYDROCOD POLST-CPM POLST ER 10-8 MG/5ML PO SUER: 5.0000 mL | Freq: Two times a day (BID) | ORAL | 0 refills | Status: DC | PRN

## 2016-10-17 NOTE — ED Triage Notes (Signed)
Pt reports nasal congestion and drainage and congested cough since this past Thursday. Reports minimal sore throat and headache. Denies n/v/d, chest pain.

## 2016-10-17 NOTE — ED Provider Notes (Signed)
[facility] Provider Note   CSN: [id number] Arrival date & time: 10/17/16  [id number]     History   Chief Complaint Chief Complaint  Patient presents with  . Cough    HPI Heather Roberts is a 70 y.o. female. She's had a cough for the last 2 days. Last night. Some nasal congestion and dry cough. No fever no sinus symptoms no sore throat no body aches no GI complaints. She was immunized for influenza.  HPI  Past Medical History:  Diagnosis Date  . Allergic rhinitis   . Anemia 03/19/2013  . Anxiety   . Bouchard nodes (DJD hand)   . Carotid artery disease ([place]) 03/24/2014  . Cervical cancer screening 07/31/2015  . Diabetes mellitus type II   . DJD (degenerative joint disease), lumbar   . Fibroids   . Glaucoma   . History of DVT (deep vein thrombosis) 1994  . Hyperlipidemia   . Hypertension   . Insomnia 02/03/2016  . Nocturia 12/18/2012  . Obesity 07/31/2015  . Palpitations 12/18/2012    Patient Active Problem List   Diagnosis Date Noted  . Insomnia 02/03/2016  . Posterior right knee pain 01/01/2016  . Metatarsalgia of right foot 01/01/2016  . Cervical cancer screening 07/31/2015  . Obesity 07/31/2015  . Medicare annual wellness visit, subsequent 01/26/2015  . Sinusitis, acute maxillary 09/09/2014  . Carotid artery disease ([place]) 03/24/2014  . Anemia 03/19/2013  . Cerebrovascular disease 02/21/2013  . Chest pain 01/10/2013  . Palpitations 12/18/2012  . Nocturia 12/18/2012  . Joint pain 09/23/2012  . Finger pain 01/21/2012  . Osteoporosis screening 10/29/2011  . Low back pain 07/16/2011  . Bursitis, trochanteric 07/11/2011  . Left knee pain 10/01/2010  . CATARACTS 09/07/2010  . ECZEMA 08/21/2010  . Pain in joint, lower leg 08/21/2010  . ANXIETY 08/11/2008  . Diabetes mellitus type 2 in obese ([place]) 08/09/2008  . MENOPAUSAL DISORDER 08/09/2008  . SHOULDER STRAIN, RIGHT 08/09/2008  . Allergic rhinitis 02/08/2008  . Hyperlipidemia associated with  type 2 diabetes mellitus ([place]) 08/08/2007  . Essential hypertension 08/08/2007  . DVT, HX OF 08/08/2007  . Osteoarthrosis, hand 08/07/2007  . Osteoarthrosis, unspecified whether generalized or localized, involving lower leg 08/07/2007  . Osteoarthritis of spine 08/07/2007  . ARTHROSCOPY, KNEE, HX OF 08/07/2007    Past Surgical History:  Procedure Laterality Date  . ABDOMINAL HYSTERECTOMY    . CHOLECYSTECTOMY    . EYE SURGERY  09/2014, 08/2014   Cataracts removal  . KNEE ARTHROSCOPY      OB History    Gravida Para Term Preterm AB Living   3 3 3     3    SAB TAB Ectopic Multiple Live Births                   Home Medications    Prior to Admission medications   Medication Sig Start Date End Date Taking? Authorizing Provider  amLODipine (NORVASC) 10 MG tablet Take 1 tablet (10 mg total) by mouth daily. 05/03/16  Yes Mosie Lukes, MD  aspirin 81 MG tablet Take 81 mg by mouth daily.     Yes Historical Provider, MD  atorvastatin (LIPITOR) 40 MG tablet Take 1 tablet (40 mg total) by mouth daily. 06/01/16  Yes Mosie Lukes, MD  Cholecalciferol (VITAMIN D) 1000 UNITS capsule Take 1,000 Units by mouth. ocassionally   Yes Historical Provider, MD  Cyanocobalamin (B-12) 1000 MCG CAPS Take 1 tablet by mouth daily.   Yes  Historical Provider, MD  fluticasone (FLONASE) 50 MCG/ACT nasal spray Place 2 sprays into both nostrils daily as needed for allergies or rhinitis. 12/17/13  Yes Mosie Lukes, MD  glucose blood (GLUCOSE METER TEST) test strip To use with onetouch verio flex DX E11.9 07/31/15  Yes Mosie Lukes, MD  glucose blood (ONETOUCH VERIO) test strip Check blood sugar once daily.  DX E11.9 07/31/15  Yes Mosie Lukes, MD  losartan (COZAAR) 100 MG tablet TAKE 1 TABLET BY MOUTH EVERY DAY 05/20/14  Yes Lelon Perla, MD  meloxicam (MOBIC) 15 MG tablet TAKE 1 TABLET(15 MG) BY MOUTH DAILY 09/13/16  Yes Mosie Lukes, MD  metFORMIN (GLUCOPHAGE) 500 MG tablet Take 1 tablet (500 mg total)  by mouth 2 (two) times daily with a meal. 05/17/16  Yes Mosie Lukes, MD  methocarbamol (ROBAXIN) 500 MG tablet Take 1 tablet (500 mg total) by mouth every 8 (eight) hours as needed for muscle spasms. 09/02/16  Yes Mosie Lukes, MD  metoprolol succinate (TOPROL-XL) 50 MG 24 hr tablet TAKE 1 TABLET BY MOUTH DAILY WITH OR IMMEDIATELY FOLLOWING A MEAL 08/26/16  Yes Mosie Lukes, MD  mometasone (NASONEX) 50 MCG/ACT nasal spray Place 2 sprays into the nose daily.   Yes Historical Provider, MD  nitroGLYCERIN (NITROSTAT) 0.4 MG SL tablet Place 0.4 mg under the tongue every 5 (five) minutes as needed for chest pain.   Yes Historical Provider, MD  tiZANidine (ZANAFLEX) 2 MG tablet TAKE 1 TABLET(2 MG) BY MOUTH AT BEDTIME AS NEEDED FOR MUSCLE SPASMS 09/20/16  Yes Mosie Lukes, MD  traMADol (ULTRAM) 50 MG tablet Take 1 tablet (50 mg total) by mouth 3 (three) times daily as needed. 10/01/16  Yes Mosie Lukes, MD  Chlorphen-Phenyleph-APAP (CORICIDIN D COLD/FLU/SINUS) 2-5-325 MG TABS Take 1 tablet by mouth as directed.    Historical Provider, MD  chlorpheniramine-HYDROcodone (TUSSIONEX PENNKINETIC ER) 10-8 MG/5ML SUER Take 5 mLs by mouth every 12 (twelve) hours as needed for cough. 10/17/16   Tanna Furry, MD  phenazopyridine (PYRIDIUM) 200 MG tablet Take 1 tablet (200 mg total) by mouth 3 (three) times daily as needed for pain. 04/13/16   Merryl Hacker, MD  Turmeric 500 MG CAPS Take 1 tablet by mouth daily.    Historical Provider, MD    Family History Family History  Problem Relation Age of Onset  . Diabetes Mother   . Kidney disease Mother   . COPD Father     smoker  . Stroke Sister   . Breast cancer Sister     s/p mastectomy  . COPD Brother   . Stroke    . Diabetes Paternal Grandmother     Social History Social History  Substance Use Topics  . Smoking status: Former Smoker    Packs/day: 0.30    Years: 8.00    Types: Cigarettes  . Smokeless tobacco: Never Used  . Alcohol use 0.0  oz/week     Comment: Rarely---glass of wine with a meeting     Allergies   Patient has no known allergies.   Review of Systems Review of Systems  Constitutional: Negative for appetite change, chills, diaphoresis, fatigue and fever.  HENT: Positive for congestion, postnasal drip and rhinorrhea. Negative for mouth sores, sore throat and trouble swallowing.   Eyes: Negative for visual disturbance.  Respiratory: Positive for cough. Negative for chest tightness, shortness of breath and wheezing.   Cardiovascular: Negative for chest pain.  Gastrointestinal: Negative for abdominal  distention, abdominal pain, diarrhea, nausea and vomiting.  Endocrine: Negative for polydipsia, polyphagia and polyuria.  Genitourinary: Negative for dysuria, frequency and hematuria.  Musculoskeletal: Negative for gait problem.  Skin: Negative for color change, pallor and rash.  Neurological: Negative for dizziness, syncope, light-headedness and headaches.  Hematological: Does not bruise/bleed easily.  Psychiatric/Behavioral: Negative for behavioral problems and confusion.     Physical Exam Updated Vital Signs BP 156/85 (BP Location: Left Arm)   Pulse 102   Temp 98.5 F (36.9 C) (Oral)   Resp 18   Ht 5\' 1"  (1.549 m)   Wt 180 lb (81.6 kg)   SpO2 95%   BMI 34.01 kg/m   Physical Exam  Constitutional: She is oriented to person, place, and time. She appears well-developed and well-nourished. No distress.  HENT:  Head: Normocephalic.  Eyes: Conjunctivae are normal. Pupils are equal, round, and reactive to light. No scleral icterus.  Neck: Normal range of motion. Neck supple. No thyromegaly present.  Cardiovascular: Normal rate and regular rhythm.  Exam reveals no gallop and no friction rub.   No murmur heard. Pulmonary/Chest: Effort normal and breath sounds normal. No respiratory distress. She has no wheezes. She has no rales.  Abdominal: Soft. Bowel sounds are normal. She exhibits no distension. There  is no tenderness. There is no rebound.  Musculoskeletal: Normal range of motion.  Neurological: She is alert and oriented to person, place, and time.  Skin: Skin is warm and dry. No rash noted.  Psychiatric: She has a normal mood and affect. Her behavior is normal.     ED Treatments / Results  Labs (all labs ordered are listed, but only abnormal results are displayed) Labs Reviewed - No data to display  EKG  EKG Interpretation None       Radiology No results found.  Procedures Procedures (including critical care time)  Medications Ordered in ED Medications - No data to display   Initial Impression / Assessment and Plan / ED Course  I have reviewed the triage vital signs and the nursing notes.  Pertinent labs & imaging results that were available during my care of the patient were reviewed by me and considered in my medical decision making (see chart for details).  Clinical Course     Viral upper respiratory infection symptoms. No symptoms or findings to suggest acute suppurative bacterial infection. Plan expectant  management. Tussionex for her cough  Final Clinical Impressions(s) / ED Diagnoses   Final diagnoses:  Acute upper respiratory infection    New Prescriptions New Prescriptions   CHLORPHENIRAMINE-HYDROCODONE (TUSSIONEX PENNKINETIC ER) 10-8 MG/5ML SUER    Take 5 mLs by mouth every 12 (twelve) hours as needed for cough.     Tanna Furry, MD 10/17/16 [phone]

## 2016-10-20 ENCOUNTER — Ambulatory Visit: Payer: Medicare Other | Admitting: Family Medicine

## 2016-10-21 ENCOUNTER — Ambulatory Visit: Payer: Medicare Other | Admitting: Family Medicine

## 2016-11-02 ENCOUNTER — Other Ambulatory Visit: Payer: Self-pay | Admitting: Family Medicine

## 2016-11-12 ENCOUNTER — Ambulatory Visit (INDEPENDENT_AMBULATORY_CARE_PROVIDER_SITE_OTHER): Payer: Medicare Other | Admitting: Medical

## 2016-11-12 ENCOUNTER — Telehealth: Payer: Self-pay | Admitting: Medical

## 2016-11-12 ENCOUNTER — Encounter: Payer: Self-pay | Admitting: Medical

## 2016-11-12 VITALS — BP 160/80 | HR 79 | Temp 98.9°F | Ht 62.0 in | Wt 184.6 lb

## 2016-11-12 DIAGNOSIS — J069 Acute upper respiratory infection, unspecified: Secondary | ICD-10-CM

## 2016-11-12 MED ORDER — AZELASTINE HCL 0.1 % NA SOLN: 2.0000 | Freq: Two times a day (BID) | NASAL | 3 refills | Status: DC

## 2016-11-12 MED ORDER — AZITHROMYCIN 250 MG PO TABS: ORAL_TABLET | ORAL | 0 refills | Status: DC

## 2016-11-12 MED ORDER — BENZONATATE 100 MG PO CAPS: 100.0000 mg | ORAL_CAPSULE | Freq: Three times a day (TID) | ORAL | 0 refills | Status: DC | PRN

## 2016-11-12 MED FILL — BENZONATATE 100 MG CAP: 100 | 7 days supply | Qty: 21 | Fill #0

## 2016-11-12 NOTE — Progress Notes (Signed)
Subjective:    Patient ID: Heather Roberts, female    DOB: [birth year], 71 y.o.   MRN: [id number]  HPI  Pt in for evaluation. Sick since wed. She states on Wednesday had mild has and faint dry throat. Pt had some mild nasal and chest congestion. Pt went to ED in the past and she had tussionex equivalent in past. This wed she tried again for cough and she states caused upset stomach and loose stools. Pt stopped tussionex on wed and no recurrent stomach or loose stools.  Pt states no further ha since wed. Throat no longer feels dry.   Pt not reporting any fever, no chills, no sweats and no bodyaches.  Pt states hx of sinus infections about one time a year.  Pt bp high. No cardiac or neurologic signs or symptoms.  Review of Systems  Constitutional: Negative for chills, fatigue and fever.  HENT: Positive for congestion. Negative for postnasal drip, sinus pain, sinus pressure and sore throat.   Respiratory: Positive for cough. Negative for chest tightness, shortness of breath and wheezing.        Non productive cough  Cardiovascular: Negative for chest pain and palpitations.  Gastrointestinal: Negative for abdominal pain.  Musculoskeletal: Negative for back pain, myalgias, neck pain and neck stiffness.  Skin: Negative for rash.  Neurological: Negative for dizziness and facial asymmetry.  Hematological: Negative for adenopathy. Does not bruise/bleed easily.  Psychiatric/Behavioral: Negative for behavioral problems and confusion.    Past Medical History:  Diagnosis Date  . Allergic rhinitis   . Anemia 03/19/2013  . Anxiety   . Bouchard nodes (DJD hand)   . Carotid artery disease ([place]) 03/24/2014  . Cervical cancer screening 07/31/2015  . Diabetes mellitus type II   . DJD (degenerative joint disease), lumbar   . Fibroids   . Glaucoma   . History of DVT (deep vein thrombosis) 1994  . Hyperlipidemia   . Hypertension   . Insomnia 02/03/2016  . Nocturia 12/18/2012  . Obesity  07/31/2015  . Palpitations 12/18/2012     Social History   Social History  . Marital status: Married    Spouse name: Essick  . Number of children: 3  . Years of education: N/A   Occupational History  . Homemaker Retired    used to work for [employer]  . Smoking status: Former Smoker    Packs/day: 0.30    Years: 8.00    Types: Cigarettes  . Smokeless tobacco: Never Used  . Alcohol use 0.0 oz/week     Comment: Rarely---glass of wine with a meeting  . Drug use: No  . Sexual activity: Yes    Partners: Male     Comment: lives with husband, no dietary restrictions, minimizes dairy is walking more now   Other Topics Concern  . 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 ABDOMINAL HYSTERECTOMY    . CHOLECYSTECTOMY    . EYE SURGERY  09/2014, 08/2014   Cataracts removal  . KNEE ARTHROSCOPY      Family History  Problem Relation Age of Onset  . Diabetes Mother   . Kidney disease  Mother   . COPD Father     smoker  . Stroke Sister   . Breast cancer Sister     s/p mastectomy  . COPD Brother   . Stroke    . Diabetes Paternal Grandmother     No Known Allergies  Current Outpatient Prescriptions on File Prior to Visit  Medication Sig Dispense Refill  . amLODipine (NORVASC) 10 MG tablet Take 1 tablet (10 mg total) by mouth daily. 90 tablet 1  . aspirin 81 MG tablet Take 81 mg by mouth daily.      Marland Kitchen atorvastatin (LIPITOR) 40 MG tablet Take 1 tablet (40 mg total) by mouth daily. 30 tablet 3  . Chlorphen-Phenyleph-APAP (CORICIDIN D COLD/FLU/SINUS) 2-5-325 MG TABS Take 1 tablet by mouth as directed.    . Cholecalciferol (VITAMIN D) 1000 UNITS capsule Take 1,000 Units by mouth. ocassionally    . Cyanocobalamin (B-12) 1000 MCG CAPS Take 1  tablet by mouth daily.    . fluticasone (FLONASE) 50 MCG/ACT nasal spray Place 2 sprays into both nostrils daily as needed for allergies or rhinitis. 16 g 6  . glucose blood (GLUCOSE METER TEST) test strip To use with onetouch verio flex DX E11.9 100 each 6  . glucose blood (ONETOUCH VERIO) test strip Check blood sugar once daily.  DX E11.9 100 each 6  . losartan (COZAAR) 100 MG tablet TAKE 1 TABLET BY MOUTH EVERY DAY 30 tablet 0  . meloxicam (MOBIC) 15 MG tablet TAKE 1 TABLET(15 MG) BY MOUTH DAILY 30 tablet 0  . metFORMIN (GLUCOPHAGE) 500 MG tablet Take 1 tablet (500 mg total) by mouth 2 (two) times daily with a meal. 180 tablet 3  . methocarbamol (ROBAXIN) 500 MG tablet Take 1 tablet (500 mg total) by mouth every 8 (eight) hours as needed for muscle spasms. 30 tablet 1  . metoprolol succinate (TOPROL-XL) 50 MG 24 hr tablet TAKE 1 TABLET BY MOUTH DAILY WITH OR IMMEDIATELY FOLLOWING A MEAL 30 tablet 0  . mometasone (NASONEX) 50 MCG/ACT nasal spray Place 2 sprays into the nose daily.    . nitroGLYCERIN (NITROSTAT) 0.4 MG SL tablet Place 0.4 mg under the tongue every 5 (five) minutes as needed for chest pain.    . phenazopyridine (PYRIDIUM) 200 MG tablet Take 1 tablet (200 mg total) by mouth 3 (three) times daily as needed for pain. 6 tablet 0  . tiZANidine (ZANAFLEX) 2 MG tablet TAKE 1 TABLET(2 MG) BY MOUTH AT BEDTIME AS NEEDED FOR MUSCLE SPASMS 30 tablet 0  . traMADol (ULTRAM) 50 MG tablet Take 1 tablet (50 mg total) by mouth 3 (three) times daily as needed. 40 tablet 0  . Turmeric 500 MG CAPS Take 1 tablet by mouth daily.    . chlorpheniramine-HYDROcodone (TUSSIONEX PENNKINETIC ER) 10-8 MG/5ML SUER Take 5 mLs by mouth every 12 (twelve) hours as needed for cough. (Patient not taking: Reported on 11/12/2016) 80 mL 0   No current facility-administered medications on file prior to visit.     BP (!) 160/80   Pulse 79   Temp 98.9 F (37.2 C) (Oral)   Ht 5\' 2"  (1.575 m)   Wt 184 lb 9.6 oz (83.7  kg)   SpO2 98%   BMI 33.76 kg/m       Objective:   Physical Exam  General  Mental Status - Alert. General Appearance - Well groomed. Not in acute distress.  Skin Rashes- No Rashes.  HEENT Head- Normal. Ear Auditory Canal - Left- Normal. Right -  Normal.Tympanic Membrane- Left- Normal. Right- Normal. Eye Sclera/Conjunctiva- Left- Normal. Right- Normal. Nose & Sinuses Nasal Mucosa- Left-  Boggy and Congested. Right-  Boggy and  Congested.Bilateral no  maxillary and no  frontal sinus pressure. Mouth & Throat Lips: Upper Lip- Normal: no dryness, cracking, pallor, cyanosis, or vesicular eruption. Lower Lip-Normal: no dryness, cracking, pallor, cyanosis or vesicular eruption. Buccal Mucosa- Bilateral- No Aphthous ulcers. Oropharynx- No Discharge or Erythema. Tonsils: Characteristics- Bilateral- No Erythema or Congestion. Size/Enlargement- Bilateral- No enlargement. Discharge- bilateral-None.  Neck Neck- Supple. No Masses.   Chest and Lung Exam Auscultation: Breath Sounds:-Clear even and unlabored.  Cardiovascular Auscultation:Rythm- Regular, rate and rhythm. Murmurs & Other Heart Sounds:Ausculatation of the heart reveal- No Murmurs.  Lymphatic Head & Neck General Head & Neck Lymphatics: Bilateral: Description- No Localized lymphadenopathy.       Assessment & Plan:  Samuel [place] vs concern for early bronchitis.  Will rx benzonatate for cough. For nasal congestion rx astelin and continue flonase.   If you worsen as described over the weekend then start azithromycin antibiotic. But not necessary presently.  Please restart your bp med/today.  Follow up in 7-10 days or as needed  Avett Reineck, Percell Miller, [employer]

## 2016-11-12 NOTE — Patient Instructions (Signed)
Uri vs concern for early bronchitis.  Will rx benzonatate for cough. For nasal congestion rx astelin and continue flonase.   If you worsen as described over the weekend then start azithromycin antibiotic. But not necessary presently.  Please restart your bp med/today.  Follow up in 7-10 days or as needed

## 2016-11-12 NOTE — Telephone Encounter (Signed)
Tussionex added to patient's allergy list as advised.

## 2016-11-12 NOTE — Progress Notes (Signed)
Pre visit review using our clinic tool,if applicable. No additional management support is needed unless otherwise documented below in the visit note.  

## 2016-11-12 NOTE — Telephone Encounter (Signed)
Will you add tussionex to her allergy list. Upset stomach and loose stool reaction

## 2016-12-03 ENCOUNTER — Ambulatory Visit (INDEPENDENT_AMBULATORY_CARE_PROVIDER_SITE_OTHER): Payer: Medicare Other | Admitting: Family Medicine

## 2016-12-03 DIAGNOSIS — D649 Anemia, unspecified: Secondary | ICD-10-CM | POA: Diagnosis not present

## 2016-12-03 DIAGNOSIS — E1169 Type 2 diabetes mellitus with other specified complication: Secondary | ICD-10-CM

## 2016-12-03 DIAGNOSIS — I10 Essential (primary) hypertension: Secondary | ICD-10-CM

## 2016-12-03 DIAGNOSIS — E6609 Other obesity due to excess calories: Secondary | ICD-10-CM

## 2016-12-03 DIAGNOSIS — E669 Obesity, unspecified: Secondary | ICD-10-CM | POA: Diagnosis not present

## 2016-12-03 DIAGNOSIS — E785 Hyperlipidemia, unspecified: Secondary | ICD-10-CM

## 2016-12-03 LAB — CBC
HEMATOCRIT: 38 % (ref 36.0–46.0)
HEMOGLOBIN: 12.6 g/dL (ref 12.0–15.0)
MCHC: 33.1 g/dL (ref 30.0–36.0)
MCV: 93.5 fl (ref 78.0–100.0)
Platelets: 216 10*3/uL (ref 150.0–400.0)
RBC: 4.06 Mil/uL (ref 3.87–5.11)
RDW: 14 % (ref 11.5–15.5)
WBC: 5.9 10*3/uL (ref 4.0–10.5)

## 2016-12-03 LAB — LIPID PANEL
CHOL/HDL RATIO: 3
Cholesterol: 176 mg/dL (ref 0–200)
HDL: 55.3 mg/dL (ref >39.00)
LDL Cholesterol: 121 mg/dL — ABNORMAL HIGH (ref 0–99)
NONHDL: 121.09
Triglycerides: 0 mg/dL (ref 0.0–149.0)
VLDL: 0 mg/dL (ref 0.0–40.0)

## 2016-12-03 LAB — COMPREHENSIVE METABOLIC PANEL
ALT: 19 U/L (ref 0–35)
AST: 21 U/L (ref 0–37)
Albumin: 4.3 g/dL (ref 3.5–5.2)
Alkaline Phosphatase: 94 U/L (ref 39–117)
BUN: 14 mg/dL (ref 6–23)
CO2: 28 meq/L (ref 19–32)
Calcium: 9.8 mg/dL (ref 8.4–10.5)
Chloride: 102 mEq/L (ref 96–112)
Creatinine, Ser: 0.91 mg/dL (ref 0.40–1.20)
GFR: 78.5 mL/min (ref >60.00)
GLUCOSE: 124 mg/dL — AB (ref 70–99)
POTASSIUM: 4 meq/L (ref 3.5–5.1)
SODIUM: 137 meq/L (ref 135–145)
TOTAL PROTEIN: 7.6 g/dL (ref 6.0–8.3)
Total Bilirubin: 0.8 mg/dL (ref 0.2–1.2)

## 2016-12-03 LAB — HEMOGLOBIN A1C: Hgb A1c MFr Bld: 6.8 % — ABNORMAL HIGH (ref 4.6–6.5)

## 2016-12-03 LAB — TSH: TSH: 1.25 u[IU]/mL (ref 0.35–4.50)

## 2016-12-03 MED ORDER — TIZANIDINE HCL 2 MG PO TABS: ORAL_TABLET | ORAL | 0 refills | Status: DC

## 2016-12-03 NOTE — Assessment & Plan Note (Signed)
Increase leafy greens, consider increased lean red meat and using cast iron cookware. Continue to monitor, report any concerns 

## 2016-12-03 NOTE — Assessment & Plan Note (Signed)
hgba1c acceptable, minimize simple carbs. Increase exercise as tolerated. Continue current meds 

## 2016-12-03 NOTE — Patient Instructions (Signed)
DM is better than D in cough and cold preparations Carbohydrate Counting for Diabetes Mellitus, Adult Carbohydrate counting is a method for keeping track of how many carbohydrates you eat. Eating carbohydrates naturally increases the amount of sugar (glucose) in the blood. Counting how many carbohydrates you eat helps keep your blood glucose within normal limits, which helps you manage your diabetes (diabetes mellitus). It is important to know how many carbohydrates you can safely have in each meal. This is different for every person. A diet and nutrition specialist (registered dietitian) can help you make a meal plan and calculate how many carbohydrates you should have at each meal and snack. Carbohydrates are found in the following foods:  Grains, such as breads and cereals.  Dried beans and soy products.  Starchy vegetables, such as potatoes, peas, and corn.  Fruit and fruit juices.  Milk and yogurt.  Sweets and snack foods, such as cake, cookies, candy, chips, and soft drinks. How do I count carbohydrates? There are two ways to count carbohydrates in food. You can use either of the methods or a combination of both. Reading "Nutrition Facts" on packaged food  The "Nutrition Facts" list is included on the labels of almost all packaged foods and beverages in the [place] It includes:  The serving size.  Information about nutrients in each serving, including the grams (g) of carbohydrate per serving. To use the "Nutrition Facts":  Decide how many servings you will have.  Multiply the number of servings by the number of carbohydrates per serving.  The resulting number is the total amount of carbohydrates that you will be having. Learning standard serving sizes of other foods  When you eat foods containing carbohydrates that are not packaged or do not include "Nutrition Facts" on the label, you need to measure the servings in order to count the amount of carbohydrates:  Measure the foods  that you will eat with a food scale or measuring cup, if needed.  Decide how many standard-size servings you will eat.  Multiply the number of servings by 15. Most carbohydrate-rich foods have about 15 g of carbohydrates per serving.  For example, if you eat 8 oz (170 g) of strawberries, you will have eaten 2 servings and 30 g of carbohydrates (2 servings x 15 g = 30 g).  For foods that have more than one food mixed, such as soups and casseroles, you must count the carbohydrates in each food that is included. The following list contains standard serving sizes of common carbohydrate-rich foods. Each of these servings has about 15 g of carbohydrates:   hamburger bun or  English muffin.   oz (15 mL) syrup.   oz (14 g) jelly.  1 slice of bread.  1 six-inch tortilla.  3 oz (85 g) cooked rice or pasta.  4 oz (113 g) cooked dried beans.  4 oz (113 g) starchy vegetable, such as peas, corn, or potatoes.  4 oz (113 g) hot cereal.  4 oz (113 g) mashed potatoes or  of a large baked potato.  4 oz (113 g) canned or frozen fruit.  4 oz (120 mL) fruit juice.  4-6 crackers.  6 chicken nuggets.  6 oz (170 g) unsweetened dry cereal.  6 oz (170 g) plain fat-free yogurt or yogurt sweetened with artificial sweeteners.  8 oz (240 mL) milk.  8 oz (170 g) fresh fruit or one small piece of fruit.  24 oz (680 g) popped popcorn. Example of carbohydrate counting Sample meal  3 oz (85 g) chicken breast.  6 oz (170 g) brown rice.  4 oz (113 g) corn.  8 oz (240 mL) milk.  8 oz (170 g) strawberries with sugar-free whipped topping. Carbohydrate calculation 1. Identify the foods that contain carbohydrates:  Rice.  Corn.  Milk.  Strawberries. 2. Calculate how many servings you have of each food:  2 servings rice.  1 serving corn.  1 serving milk.  1 serving strawberries. 3. Multiply each number of servings by 15 g:  2 servings rice x 15 g = 30 g.  1 serving corn x  15 g = 15 g.  1 serving milk x 15 g = 15 g.  1 serving strawberries x 15 g = 15 g. 4. Add together all of the amounts to find the total grams of carbohydrates eaten:  30 g + 15 g + 15 g + 15 g = 75 g of carbohydrates total. This information is not intended to replace advice given to you by your health care provider. Make sure you discuss any questions you have with your health care provider. Document Released: 10/04/2005 Document Revised: 04/23/2016 Document Reviewed: 03/17/2016 Elsevier Interactive Patient Education  2017 [employer].

## 2016-12-03 NOTE — Progress Notes (Signed)
Patient ID: Heather Roberts, female   DOB: 12-26-1945, 71 y.o.   MRN: [id number]   Subjective:    Patient ID: Heather Roberts, female    DOB: 1946-05-27, 71 y.o.   MRN: [id number]  Chief Complaint  Patient presents with  . Follow-up  . Hypertension  . Diabetes  I acted as a Education administrator for Dr. Charlett Blake. Princess, RMA   HPI  Patient is in today for a 3 month follow up for hypertension, diabetes, hyperlipidemia and other medical concerns. She feels well today. No recent febrile illness or hospitalizations. She denies polyuria or polydipsia. Denies CP/palp/SOB/HA/congestion/fevers/GI or GU c/o. Taking meds as prescribed. She tries to stay active and maintain a heart healthy diet.   Past Medical History:  Diagnosis Date  . Allergic rhinitis   . Anemia 03/19/2013  . Anxiety   . Bouchard nodes (DJD hand)   . Carotid artery disease ([place]) 03/24/2014  . Cervical cancer screening 07/31/2015  . Diabetes mellitus type II   . DJD (degenerative joint disease), lumbar   . Fibroids   . Glaucoma   . History of DVT (deep vein thrombosis) 1994  . Hyperlipidemia   . Hypertension   . Insomnia 02/03/2016  . Nocturia 12/18/2012  . Obesity 07/31/2015  . Palpitations 12/18/2012    Past Surgical History:  Procedure Laterality Date  . ABDOMINAL HYSTERECTOMY    . CHOLECYSTECTOMY    . EYE SURGERY  09/2014, 08/2014   Cataracts removal  . KNEE ARTHROSCOPY      Family History  Problem Relation Age of Onset  . Diabetes Mother   . Kidney disease Mother   . COPD Father     smoker  . Stroke Sister   . Breast cancer Sister     s/p mastectomy  . COPD Brother   . Stroke    . Diabetes Paternal Grandmother     Social History   Social History  . Marital status: Married    Spouse name: Essick  . Number of children: 3  . Years of education: N/A   Occupational History  . Homemaker Retired    used to work for [employer]  . Smoking status: Former  Smoker    Packs/day: 0.30    Years: 8.00    Types: Cigarettes  . Smokeless tobacco: Never Used  . Alcohol use 0.0 oz/week     Comment: Rarely---glass of wine with a meeting  . Drug use: No  . Sexual activity: Yes    Partners: Male     Comment: lives with husband, no dietary restrictions, minimizes dairy is walking more now   Other Topics Concern  . 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 amLODipine (NORVASC) 10 MG tablet Take 1 tablet (10 mg total) by mouth daily. 90 tablet 1  . aspirin 81 MG tablet Take 81 mg by mouth daily.      Marland Kitchen atorvastatin (LIPITOR) 40 MG tablet Take 1 tablet (40 mg total) by mouth daily. 30 tablet 3  . Chlorphen-Phenyleph-APAP (CORICIDIN D COLD/FLU/SINUS) 2-5-325 MG TABS Take 1 tablet by mouth as directed.    Marland Kitchen  Cholecalciferol (VITAMIN D) 1000 UNITS capsule Take 1,000 Units by mouth. ocassionally    . Cyanocobalamin (B-12) 1000 MCG CAPS Take 1 tablet by mouth daily.    . fluticasone (FLONASE) 50 MCG/ACT nasal spray Place 2 sprays into both nostrils daily as needed for allergies or rhinitis. 16 g 6  . glucose blood (GLUCOSE METER TEST) test strip To use with onetouch verio flex DX E11.9 100 each 6  . glucose blood (ONETOUCH VERIO) test strip Check blood sugar once daily.  DX E11.9 100 each 6  . losartan (COZAAR) 100 MG tablet TAKE 1 TABLET BY MOUTH EVERY DAY 30 tablet 0  . meloxicam (MOBIC) 15 MG tablet TAKE 1 TABLET(15 MG) BY MOUTH DAILY 30 tablet 0  . metFORMIN (GLUCOPHAGE) 500 MG tablet Take 1 tablet (500 mg total) by mouth 2 (two) times daily with a meal. 180 tablet 3  . methocarbamol (ROBAXIN) 500 MG tablet Take 1 tablet (500 mg total) by mouth every 8 (eight) hours as needed for muscle spasms. 30 tablet 1  . metoprolol succinate  (TOPROL-XL) 50 MG 24 hr tablet TAKE 1 TABLET BY MOUTH DAILY WITH OR IMMEDIATELY FOLLOWING A MEAL 30 tablet 0  . mometasone (NASONEX) 50 MCG/ACT nasal spray Place 2 sprays into the nose daily.    . nitroGLYCERIN (NITROSTAT) 0.4 MG SL tablet Place 0.4 mg under the tongue every 5 (five) minutes as needed for chest pain.    . phenazopyridine (PYRIDIUM) 200 MG tablet Take 1 tablet (200 mg total) by mouth 3 (three) times daily as needed for pain. 6 tablet 0  . traMADol (ULTRAM) 50 MG tablet Take 1 tablet (50 mg total) by mouth 3 (three) times daily as needed. 40 tablet 0  . Turmeric 500 MG CAPS Take 1 tablet by mouth daily.    Marland Kitchen azelastine (ASTELIN) 0.1 % nasal spray Place 2 sprays into both nostrils 2 (two) times daily. Use in each nostril as directed 30 mL 3  . azithromycin (ZITHROMAX) 250 MG tablet Take 2 tablets by mouth on day 1, followed by 1 tablet by mouth daily for 4 days. 6 tablet 0  . benzonatate (TESSALON) 100 MG capsule Take 1 capsule (100 mg total) by mouth 3 (three) times daily as needed for cough. 21 capsule 0  . tiZANidine (ZANAFLEX) 2 MG tablet TAKE 1 TABLET(2 MG) BY MOUTH AT BEDTIME AS NEEDED FOR MUSCLE SPASMS 30 tablet 0   No facility-administered medications prior to visit.     Allergies  Allergen Reactions  . Tussionex Pennkinetic Er [Hydrocod Polst-Cpm Polst Er] Other (See Comments)    Upset stomach and loose stool     Review of Systems  Constitutional: Negative for chills, fever and malaise/fatigue.  HENT: Negative for congestion and hearing loss.   Eyes: Negative for discharge.  Respiratory: Negative for cough, sputum production and shortness of breath.   Cardiovascular: Negative for chest pain, palpitations and leg swelling.  Gastrointestinal: Negative for abdominal pain, blood in stool, constipation, diarrhea, heartburn, nausea and vomiting.  Genitourinary: Negative for dysuria, frequency, hematuria and urgency.  Musculoskeletal: Negative for back pain, falls and  myalgias.  Skin: Negative for rash.  Neurological: Negative for dizziness, sensory change, loss of consciousness, weakness and headaches.  Endo/Heme/Allergies: Negative for environmental allergies. Does not bruise/bleed easily.  Psychiatric/Behavioral: Negative for depression and suicidal ideas. The patient is not nervous/anxious and does not have insomnia.        Objective:    Physical Exam  Constitutional: She is oriented to  person, place, and time. She appears well-developed and well-nourished. No distress.  HENT:  Head: Normocephalic and atraumatic.  Nose: Nose normal.  Eyes: Right eye exhibits no discharge. Left eye exhibits no discharge.  Neck: Normal range of motion. Neck supple.  Cardiovascular: Normal rate and regular rhythm.   No murmur heard. Pulmonary/Chest: Effort normal and breath sounds normal.  Abdominal: Soft. Bowel sounds are normal. There is no tenderness.  Musculoskeletal: She exhibits no edema.  Neurological: She is alert and oriented to person, place, and time.  Skin: Skin is warm and dry.  Psychiatric: She has a normal mood and affect.  Nursing note and vitals reviewed.   BP (!) 142/62 (BP Location: Left Arm, Patient Position: Sitting, Cuff Size: Normal)   Pulse 66   Temp 98.2 F (36.8 C) (Oral)   Wt 183 lb 12.8 oz (83.4 kg)   SpO2 97%   BMI 33.62 kg/m  Wt Readings from Last 3 Encounters:  12/03/16 183 lb 12.8 oz (83.4 kg)  11/12/16 184 lb 9.6 oz (83.7 kg)  10/17/16 180 lb (81.6 kg)     Lab Results  Component Value Date   WBC 5.9 12/03/2016   HGB 12.6 12/03/2016   HCT 38.0 12/03/2016   PLT 216.0 12/03/2016   GLUCOSE 124 (H) 12/03/2016   CHOL 176 12/03/2016   TRIG 0.0 12/03/2016   HDL 55.30 12/03/2016   LDLDIRECT 118.5 10/04/2007   LDLCALC 121 (H) 12/03/2016   ALT 19 12/03/2016   AST 21 12/03/2016   NA 137 12/03/2016   K 4.0 12/03/2016   CL 102 12/03/2016   CREATININE 0.91 12/03/2016   BUN 14 12/03/2016   CO2 28 12/03/2016   TSH  1.25 12/03/2016   HGBA1C 6.8 (H) 12/03/2016   MICROALBUR 0.50 06/14/2012    Lab Results  Component Value Date   TSH 1.25 12/03/2016   Lab Results  Component Value Date   WBC 5.9 12/03/2016   HGB 12.6 12/03/2016   HCT 38.0 12/03/2016   MCV 93.5 12/03/2016   PLT 216.0 12/03/2016   Lab Results  Component Value Date   NA 137 12/03/2016   K 4.0 12/03/2016   CO2 28 12/03/2016   GLUCOSE 124 (H) 12/03/2016   BUN 14 12/03/2016   CREATININE 0.91 12/03/2016   BILITOT 0.8 12/03/2016   ALKPHOS 94 12/03/2016   AST 21 12/03/2016   ALT 19 12/03/2016   PROT 7.6 12/03/2016   ALBUMIN 4.3 12/03/2016   CALCIUM 9.8 12/03/2016   GFR 78.50 12/03/2016   Lab Results  Component Value Date   CHOL 176 12/03/2016   Lab Results  Component Value Date   HDL 55.30 12/03/2016   Lab Results  Component Value Date   LDLCALC 121 (H) 12/03/2016   Lab Results  Component Value Date   TRIG 0.0 12/03/2016   Lab Results  Component Value Date   CHOLHDL 3 12/03/2016   Lab Results  Component Value Date   HGBA1C 6.8 (H) 12/03/2016       Assessment & Plan:   Problem List Items Addressed This Visit    Diabetes mellitus type 2 in obese ([place])    hgba1c acceptable, minimize simple carbs. Increase exercise as tolerated. Continue current meds      Relevant Orders   Hemoglobin A1c (Completed)   Hyperlipidemia associated with type 2 diabetes mellitus ([place])    Tolerating statin, encouraged heart healthy diet, avoid trans fats, minimize simple carbs and saturated fats. Increase exercise as tolerated  Relevant Orders   Lipid panel (Completed)   Essential hypertension    Well controlled, no changes to meds. Encouraged heart healthy diet such as the DASH diet and exercise as tolerated.       Relevant Orders   CBC (Completed)   Comprehensive metabolic panel (Completed)   TSH (Completed)   Anemia    Increase leafy greens, consider increased lean red meat and using cast iron cookware. Continue  to monitor, report any concerns      Obesity    Encouraged DASH diet, decrease po intake and increase exercise as tolerated. Needs 7-8 hours of sleep nightly. Avoid trans fats, eat small, frequent meals every 4-5 hours with lean proteins, complex carbs and healthy fats. Minimize simple carbs         I have discontinued Ms. Watkins Williams's benzonatate, azelastine, and azithromycin. I am also having her maintain her aspirin, Vitamin D, fluticasone, losartan, Turmeric, B-12, glucose blood, glucose blood, nitroGLYCERIN, Chlorphen-Phenyleph-APAP, mometasone, phenazopyridine, amLODipine, metFORMIN, atorvastatin, metoprolol succinate, methocarbamol, meloxicam, traMADol, and tiZANidine.  Meds ordered this encounter  Medications  . tiZANidine (ZANAFLEX) 2 MG tablet    Sig: TAKE 1 TABLET(2 MG) BY MOUTH AT BEDTIME AS NEEDED FOR MUSCLE SPASMS    Dispense:  30 tablet    Refill:  0     [name], MD

## 2016-12-03 NOTE — Assessment & Plan Note (Signed)
Encouraged DASH diet, decrease po intake and increase exercise as tolerated. Needs 7-8 hours of sleep nightly. Avoid trans fats, eat small, frequent meals every 4-5 hours with lean proteins, complex carbs and healthy fats. Minimize simple carbs 

## 2016-12-03 NOTE — Assessment & Plan Note (Signed)
Tolerating statin, encouraged heart healthy diet, avoid trans fats, minimize simple carbs and saturated fats. Increase exercise as tolerated 

## 2016-12-03 NOTE — Progress Notes (Signed)
Pre visit review using our clinic review tool, if applicable. No additional management support is needed unless otherwise documented below in the visit note. 

## 2016-12-03 NOTE — Assessment & Plan Note (Signed)
Well controlled, no changes to meds. Encouraged heart healthy diet such as the DASH diet and exercise as tolerated.  °

## 2016-12-06 ENCOUNTER — Telehealth: Payer: Self-pay | Admitting: Family Medicine

## 2016-12-06 NOTE — Telephone Encounter (Signed)
See lab results.  

## 2016-12-06 NOTE — Telephone Encounter (Signed)
°  Relation to [id number] Call back number: [phone] Pharmacy:  Reason for call:  Pt is returning your call, please call back.

## 2016-12-07 ENCOUNTER — Ambulatory Visit (INDEPENDENT_AMBULATORY_CARE_PROVIDER_SITE_OTHER): Payer: Medicare Other | Admitting: Family Medicine

## 2016-12-07 ENCOUNTER — Encounter: Payer: Self-pay | Admitting: Family Medicine

## 2016-12-07 DIAGNOSIS — M25562 Pain in left knee: Secondary | ICD-10-CM

## 2016-12-07 DIAGNOSIS — M25561 Pain in right knee: Secondary | ICD-10-CM | POA: Diagnosis not present

## 2016-12-07 DIAGNOSIS — G8929 Other chronic pain: Secondary | ICD-10-CM | POA: Diagnosis not present

## 2016-12-07 DIAGNOSIS — M545 Low back pain: Secondary | ICD-10-CM

## 2016-12-07 MED ORDER — CELECOXIB 200 MG PO CAPS: 200.0000 mg | ORAL_CAPSULE | Freq: Two times a day (BID) | ORAL | 1 refills | Status: DC

## 2016-12-07 NOTE — Patient Instructions (Signed)
We will go ahead with an MRI of your back - we will see if they can expand the window to include T11 and T12 otherwise we will have to do to studies. It will be an open MRI. Let me know if you need something to relax you for the MRI.  Your knee pain is due to arthritis. Take tylenol 500mg  1-2 tabs three times a day for pain. Aleve 1-2 tabs twice a day with food OR fill the celebrex and take it twice a day - do not take both. Capsaicin, aspercreme, or biofreeze topically up to four times a day may also help with pain. Cortisone injections are an option. If cortisone injections do not help, there are different types of shots that may help but they take longer to take effect. It's important that you continue to stay active. Straight leg raises, knee extensions 3 sets of 10 once a day (add ankle weight if these become too easy). Consider physical therapy to strengthen muscles around the joint that hurts to take pressure off of the joint itself. Shoe inserts with good arch support may be helpful. Walker or cane if needed. Heat or ice 15 minutes at a time 3-4 times a day as needed to help with pain. Water aerobics and cycling with low resistance are the best two types of exercise for arthritis.

## 2016-12-09 ENCOUNTER — Encounter (INDEPENDENT_AMBULATORY_CARE_PROVIDER_SITE_OTHER): Payer: Self-pay | Admitting: Family Medicine

## 2016-12-09 NOTE — Progress Notes (Addendum)
PCP and consultation requested by: [name], MD  Subjective:   HPI: Patient is a 71 y.o. female here for low back pain.  11/20: Patient report she started to get pain in low back on 10/28. Started when she was at a [employer] - no injury or trauma just started getting pain central low back. Pain is 0/10 at rest but can get up to 7/10 and stabbing. She is taking tizanidine on active days which helps some. Not taking any other medicines. Does stretching in morning and an army based exercise program (husband was in the [employer]). No radiation. No numbness or tingling with this. No bowel/bladder dysfunction.  12/07/16: Patient reports she continues to have soreness midline of mid-low back. Pain level 6/10 now. Pain with bending, prolonged sitting or standing. Hasn't been able to do home exercises as much as she had the flu twice. Has been resting, taking tizanidine and over the counter medications. Using a back belt which helps. Knees also bothering her. Has tried knee braces. Prior history of bilateral partial meniscectomies. Pain also 6/10 both areas, worse with prolonged walking and going from sitting to standing. No bowel/bladder dysfunction. No numbness or tingling. No radiation of back pain.  Past Medical History:  Diagnosis Date  . Allergic rhinitis   . Anemia 03/19/2013  . Anxiety   . Bouchard nodes (DJD hand)   . Carotid artery disease ([place]) 03/24/2014  . Cervical cancer screening 07/31/2015  . Diabetes mellitus type II   . DJD (degenerative joint disease), lumbar   . Fibroids   . Glaucoma   . History of DVT (deep vein thrombosis) 1994  . Hyperlipidemia   . Hypertension   . Insomnia 02/03/2016  . Nocturia 12/18/2012  . Obesity 07/31/2015  . Palpitations 12/18/2012    Current Outpatient Prescriptions on File Prior to Visit  Medication Sig Dispense Refill  . amLODipine (NORVASC) 10 MG tablet Take 1 tablet (10 mg total) by mouth daily. 90 tablet 1  .  aspirin 81 MG tablet Take 81 mg by mouth daily.      Marland Kitchen atorvastatin (LIPITOR) 40 MG tablet Take 1 tablet (40 mg total) by mouth daily. 30 tablet 3  . Chlorphen-Phenyleph-APAP (CORICIDIN D COLD/FLU/SINUS) 2-5-325 MG TABS Take 1 tablet by mouth as directed.    . Cholecalciferol (VITAMIN D) 1000 UNITS capsule Take 1,000 Units by mouth. ocassionally    . Cyanocobalamin (B-12) 1000 MCG CAPS Take 1 tablet by mouth daily.    . fluticasone (FLONASE) 50 MCG/ACT nasal spray Place 2 sprays into both nostrils daily as needed for allergies or rhinitis. 16 g 6  . glucose blood (GLUCOSE METER TEST) test strip To use with onetouch verio flex DX E11.9 100 each 6  . glucose blood (ONETOUCH VERIO) test strip Check blood sugar once daily.  DX E11.9 100 each 6  . losartan (COZAAR) 100 MG tablet TAKE 1 TABLET BY MOUTH EVERY DAY 30 tablet 0  . meloxicam (MOBIC) 15 MG tablet TAKE 1 TABLET(15 MG) BY MOUTH DAILY 30 tablet 0  . metFORMIN (GLUCOPHAGE) 500 MG tablet Take 1 tablet (500 mg total) by mouth 2 (two) times daily with a meal. 180 tablet 3  . methocarbamol (ROBAXIN) 500 MG tablet Take 1 tablet (500 mg total) by mouth every 8 (eight) hours as needed for muscle spasms. 30 tablet 1  . metoprolol succinate (TOPROL-XL) 50 MG 24 hr tablet TAKE 1 TABLET BY MOUTH DAILY WITH OR IMMEDIATELY FOLLOWING A MEAL 30 tablet 0  .  mometasone (NASONEX) 50 MCG/ACT nasal spray Place 2 sprays into the nose daily.    . nitroGLYCERIN (NITROSTAT) 0.4 MG SL tablet Place 0.4 mg under the tongue every 5 (five) minutes as needed for chest pain.    . phenazopyridine (PYRIDIUM) 200 MG tablet Take 1 tablet (200 mg total) by mouth 3 (three) times daily as needed for pain. 6 tablet 0  . tiZANidine (ZANAFLEX) 2 MG tablet TAKE 1 TABLET(2 MG) BY MOUTH AT BEDTIME AS NEEDED FOR MUSCLE SPASMS 30 tablet 0  . traMADol (ULTRAM) 50 MG tablet Take 1 tablet (50 mg total) by mouth 3 (three) times daily as needed. 40 tablet 0  . Turmeric 500 MG CAPS Take 1 tablet  by mouth daily.     No current facility-administered medications on file prior to visit.     Past Surgical History:  Procedure Laterality Date  . ABDOMINAL HYSTERECTOMY    . CHOLECYSTECTOMY    . EYE SURGERY  09/2014, 08/2014   Cataracts removal  . KNEE ARTHROSCOPY      Allergies  Allergen Reactions  . Tussionex Pennkinetic Er [Hydrocod Polst-Cpm Polst Er] Other (See Comments)    Upset stomach and loose stool     Social History   Social History  . Marital status: Married    Spouse name: Essick  . Number of children: 3  . Years of education: N/A   Occupational History  . Homemaker Retired    used to work for [employer]  . Smoking status: Former Smoker    Packs/day: 0.30    Years: 8.00    Types: Cigarettes  . Smokeless tobacco: Never Used  . Alcohol use 0.0 oz/week     Comment: Rarely---glass of wine with a meeting  . Drug use: No  . Sexual activity: Yes    Partners: Male     Comment: lives with husband, no dietary restrictions, minimizes dairy is walking more now   Other Topics Concern  . 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 Diabetes Mother   . Kidney disease Mother   . COPD Father     smoker  . Stroke Sister   . Breast cancer Sister     s/p mastectomy  . COPD Brother   . Stroke    . Diabetes Paternal Grandmother     BP (!) 151/89   Pulse 80   Ht 5\' 2"  (1.575 m)   Wt 183 lb (83 kg)   BMI 33.47 kg/m   Review of Systems: See HPI above.     Objective:  Physical Exam:  Gen: NAD, comfortable in exam room  Back: No gross deformity, scoliosis. TTP midline at T11-L1.  No other tenderness. FROM with pain on flexion. Strength LEs 5/5 all muscle groups.   2+ MSRs in patellar and achilles tendons, equal  bilaterally. Negative SLRs. Sensation intact to light touch bilaterally. Negative logroll bilateral hips Negative fabers and piriformis stretches.  Bilateral knees: No gross deformity, ecchymoses, swelling. No TTP. FROM. Negative ant/post drawers. Negative valgus/varus testing. Negative lachmanns. Negative mcmurrays, apleys, patellar apprehension. NV intact distally.   Assessment & Plan:  1. Low back  pain - Previously independently reviewed radiographs - severe DDD at L3-4 and L4-5 levels.  Not improving since last visit and does have midline tenderness now.  Will go ahead with MRI to assess for possible occult compression fracture, disc herniation.  Tizanidine, topical medications, nsaids as needed.  2. Bilateral knee pain - exam reassuring.  Consistent with arthritis.  Discussed tylenol, aleve, topical medications.  Declined injections for now and physical therapy.  Shown home exercises to do daily.  Heat/ice.    Addendum:  MRIs reviewed and discussed with patient.  No evidence compression fracture or worrisome findings.  She has extensive degenerative changes of thoracic and lumbar spine.  I encouraged her to consider physical therapy - she will think about this and let us know if she would like to proceed.

## 2016-12-09 NOTE — Assessment & Plan Note (Signed)
Previously independently reviewed radiographs - severe DDD at L3-4 and L4-5 levels.  Not improving since last visit and does have midline tenderness now.  Will go ahead with MRI to assess for possible occult compression fracture, disc herniation.  Tizanidine, topical medications, nsaids as needed.

## 2016-12-09 NOTE — Progress Notes (Signed)
HPI: FU cerebrovascular disease, CP and palpitations. Stress echocardiogram in April of 2014 showed normal wall motion. There was a hypertensive response. Event monitor in April of 2014 showed sinus rhythm. CTA in March of 2014 showed no pulmonary embolus. TSH normal. Carotid Dopplers in May 2015 showed 1--39% bilateral stenosis. Followup recommended in 2 years. Since I last saw her, she denies dyspnea, chest pain or syncope. She recently climbs stairs and then developed palpitations for 2 minutes which resolved with rest. No associated symptoms.  Current Outpatient Prescriptions  Medication Sig Dispense Refill  . amLODipine (NORVASC) 10 MG tablet Take 1 tablet (10 mg total) by mouth daily. 90 tablet 1  . aspirin 81 MG tablet Take 81 mg by mouth daily.      Marland Kitchen atorvastatin (LIPITOR) 40 MG tablet Take 1 tablet (40 mg total) by mouth daily. 30 tablet 3  . Chlorphen-Phenyleph-APAP (CORICIDIN D COLD/FLU/SINUS) 2-5-325 MG TABS Take 1 tablet by mouth as directed.    . Cholecalciferol (VITAMIN D) 1000 UNITS capsule Take 1,000 Units by mouth. ocassionally    . Cyanocobalamin (B-12) 1000 MCG CAPS Take 1 tablet by mouth daily.    . fluticasone (FLONASE) 50 MCG/ACT nasal spray Place 2 sprays into both nostrils daily as needed for allergies or rhinitis. 16 g 6  . glucose blood (GLUCOSE METER TEST) test strip To use with onetouch verio flex DX E11.9 100 each 6  . glucose blood (ONETOUCH VERIO) test strip Check blood sugar once daily.  DX E11.9 100 each 6  . losartan (COZAAR) 100 MG tablet TAKE 1 TABLET BY MOUTH EVERY DAY 30 tablet 0  . meloxicam (MOBIC) 15 MG tablet TAKE 1 TABLET(15 MG) BY MOUTH DAILY 30 tablet 0  . metFORMIN (GLUCOPHAGE) 500 MG tablet Take 1 tablet (500 mg total) by mouth 2 (two) times daily with a meal. 180 tablet 3  . methocarbamol (ROBAXIN) 500 MG tablet Take 1 tablet (500 mg total) by mouth every 8 (eight) hours as needed for muscle spasms. 30 tablet 1  . metoprolol succinate  (TOPROL-XL) 50 MG 24 hr tablet TAKE 1 TABLET BY MOUTH DAILY WITH OR IMMEDIATELY FOLLOWING A MEAL 30 tablet 0  . mometasone (NASONEX) 50 MCG/ACT nasal spray Place 2 sprays into the nose daily.    . nitroGLYCERIN (NITROSTAT) 0.4 MG SL tablet Place 0.4 mg under the tongue every 5 (five) minutes as needed for chest pain.    Marland Kitchen tiZANidine (ZANAFLEX) 2 MG tablet TAKE 1 TABLET(2 MG) BY MOUTH AT BEDTIME AS NEEDED FOR MUSCLE SPASMS 30 tablet 0  . traMADol (ULTRAM) 50 MG tablet Take 1 tablet (50 mg total) by mouth 3 (three) times daily as needed. 40 tablet 0   No current facility-administered medications for this visit.      Past Medical History:  Diagnosis Date  . Allergic rhinitis   . Anemia 03/19/2013  . Anxiety   . Bouchard nodes (DJD hand)   . Carotid artery disease ([place]) 03/24/2014  . Cervical cancer screening 07/31/2015  . Diabetes mellitus type II   . DJD (degenerative joint disease), lumbar   . Fibroids   . Glaucoma   . History of DVT (deep vein thrombosis) 1994  . Hyperlipidemia   . Hypertension   . Insomnia 02/03/2016  . Nocturia 12/18/2012  . Obesity 07/31/2015  . Palpitations 12/18/2012    Past Surgical History:  Procedure Laterality Date  . ABDOMINAL HYSTERECTOMY    . CHOLECYSTECTOMY    . EYE SURGERY  09/2014, 08/2014   Cataracts removal  . KNEE ARTHROSCOPY      Social History   Social History  . Marital status: Married    Spouse name: Essick  . Number of children: 3  . Years of education: N/A   Occupational History  . Homemaker Retired    used to work for [employer]  . Smoking status: Former Smoker    Packs/day: 0.30    Years: 8.00    Types: Cigarettes  . Smokeless tobacco: Never Used  . Alcohol use 0.0 oz/week     Comment: Rarely---glass of wine with a meeting  . Drug use: No  . Sexual activity: Yes    Partners: Male     Comment: lives with husband, no dietary restrictions, minimizes dairy is walking more now    Other Topics Concern  . 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 Diabetes Mother   . Kidney disease Mother   . COPD Father     smoker  . Stroke Sister   . Breast cancer Sister     s/p mastectomy  . COPD Brother   . Stroke    . Diabetes Paternal Grandmother     ROS: no fevers or chills, productive cough, hemoptysis, dysphasia, odynophagia, melena, hematochezia, dysuria, hematuria, rash, seizure activity, orthopnea, PND, pedal edema, claudication. Remaining systems are negative.  Physical Exam: Well-developed well-nourished in no acute distress.  Skin is warm and dry.  HEENT is normal.  Neck is supple. No bruits Chest is clear to auscultation with normal expansion.  Cardiovascular exam is regular rate and rhythm.  Abdominal exam nontender or distended. No masses palpated. Extremities show no edema. neuro grossly intact  ECG-Sinus rhythm at a rate of 67. No ST changes.  A/P  1 carotid artery disease-scheduled follow-up carotid Dopplers.  2 hyperlipidemia-continue statin. Lipids and liver monitored by primary care.  3 hypertension-blood pressure mildly elevated. Increase Toprol to 75 mg daily and follow.  4 palpitations-recent brief episode of palpitations with no recurrences. We will consider a monitor in the future if she has more frequent events.  Kirk Ruths, MD

## 2016-12-09 NOTE — Assessment & Plan Note (Signed)
exam reassuring.  Consistent with arthritis.  Discussed tylenol, aleve, topical medications.  Declined injections for now and physical therapy.  Shown home exercises to do daily.  Heat/ice.

## 2016-12-11 ENCOUNTER — Ambulatory Visit (HOSPITAL_BASED_OUTPATIENT_CLINIC_OR_DEPARTMENT_OTHER)
Admission: RE | Admit: 2016-12-11 | Discharge: 2016-12-11 | Disposition: A | Discharge status: Home / Self Care | Payer: Medicare Other | Source: Ambulatory Visit | Attending: Family Medicine | Admitting: Family Medicine

## 2016-12-11 DIAGNOSIS — M47896 Other spondylosis, lumbar region: Secondary | ICD-10-CM | POA: Diagnosis not present

## 2016-12-11 DIAGNOSIS — M545 Low back pain: Secondary | ICD-10-CM | POA: Insufficient documentation

## 2016-12-11 DIAGNOSIS — M4804 Spinal stenosis, thoracic region: Secondary | ICD-10-CM | POA: Diagnosis not present

## 2016-12-11 DIAGNOSIS — M47894 Other spondylosis, thoracic region: Secondary | ICD-10-CM | POA: Diagnosis not present

## 2016-12-11 DIAGNOSIS — M5124 Other intervertebral disc displacement, thoracic region: Secondary | ICD-10-CM | POA: Diagnosis not present

## 2016-12-11 DIAGNOSIS — M48061 Spinal stenosis, lumbar region without neurogenic claudication: Secondary | ICD-10-CM | POA: Diagnosis not present

## 2016-12-15 ENCOUNTER — Encounter (INDEPENDENT_AMBULATORY_CARE_PROVIDER_SITE_OTHER): Payer: Self-pay

## 2016-12-15 ENCOUNTER — Ambulatory Visit (INDEPENDENT_AMBULATORY_CARE_PROVIDER_SITE_OTHER): Payer: Medicare Other | Admitting: Cardiology

## 2016-12-15 ENCOUNTER — Encounter: Payer: Self-pay | Admitting: Cardiology

## 2016-12-15 VITALS — BP 144/74 | HR 67 | Ht 62.0 in | Wt 181.1 lb

## 2016-12-15 DIAGNOSIS — I679 Cerebrovascular disease, unspecified: Secondary | ICD-10-CM | POA: Diagnosis not present

## 2016-12-15 DIAGNOSIS — R002 Palpitations: Secondary | ICD-10-CM

## 2016-12-15 DIAGNOSIS — I10 Essential (primary) hypertension: Secondary | ICD-10-CM | POA: Diagnosis not present

## 2016-12-15 MED ORDER — METOPROLOL SUCCINATE ER 25 MG PO TB24: 75.0000 mg | ORAL_TABLET | Freq: Every day | ORAL | 3 refills | Status: DC

## 2016-12-15 NOTE — Patient Instructions (Signed)
Medication Instructions:   INCREASE METOPROLOL TO 75 MG ONCE DAILY= 1 AND 1/2 OF THE 50 MG TABLETS ONCE DAILY AND 3 OF THE 25 MG TABLETS ONCE DAILY  Testing/Procedures:  Your physician has requested that you have a carotid duplex. This test is an ultrasound of the carotid arteries in your neck. It looks at blood flow through these arteries that supply the brain with blood. Allow one hour for this exam. There are no restrictions or special instructions.  Follow-Up:  Your physician wants you to follow-up in: [place] will receive a reminder letter in the mail two months in advance. If you don't receive a letter, please call our office to schedule the follow-up appointment.   If you need a refill on your cardiac medications before your next appointment, please call your pharmacy.

## 2016-12-16 ENCOUNTER — Ambulatory Visit (HOSPITAL_BASED_OUTPATIENT_CLINIC_OR_DEPARTMENT_OTHER)
Admission: RE | Admit: 2016-12-16 | Discharge: 2016-12-16 | Disposition: A | Discharge status: Home / Self Care | Payer: Medicare Other | Source: Ambulatory Visit | Attending: Family Medicine | Admitting: Family Medicine

## 2016-12-16 ENCOUNTER — Ambulatory Visit (HOSPITAL_BASED_OUTPATIENT_CLINIC_OR_DEPARTMENT_OTHER)
Admission: RE | Admit: 2016-12-16 | Discharge: 2016-12-16 | Disposition: A | Discharge status: Home / Self Care | Payer: Medicare Other | Source: Ambulatory Visit | Attending: Cardiology | Admitting: Cardiology

## 2016-12-16 ENCOUNTER — Encounter (HOSPITAL_BASED_OUTPATIENT_CLINIC_OR_DEPARTMENT_OTHER): Payer: Self-pay

## 2016-12-16 ENCOUNTER — Other Ambulatory Visit: Payer: Self-pay | Admitting: Family Medicine

## 2016-12-16 DIAGNOSIS — R928 Other abnormal and inconclusive findings on diagnostic imaging of breast: Secondary | ICD-10-CM | POA: Diagnosis not present

## 2016-12-16 DIAGNOSIS — I6523 Occlusion and stenosis of bilateral carotid arteries: Secondary | ICD-10-CM | POA: Diagnosis not present

## 2016-12-16 DIAGNOSIS — Z1231 Encounter for screening mammogram for malignant neoplasm of breast: Secondary | ICD-10-CM

## 2016-12-16 DIAGNOSIS — I10 Essential (primary) hypertension: Secondary | ICD-10-CM | POA: Diagnosis not present

## 2016-12-16 DIAGNOSIS — E785 Hyperlipidemia, unspecified: Secondary | ICD-10-CM | POA: Insufficient documentation

## 2016-12-16 DIAGNOSIS — I679 Cerebrovascular disease, unspecified: Secondary | ICD-10-CM | POA: Insufficient documentation

## 2016-12-16 DIAGNOSIS — E119 Type 2 diabetes mellitus without complications: Secondary | ICD-10-CM | POA: Diagnosis not present

## 2016-12-20 ENCOUNTER — Other Ambulatory Visit: Payer: Self-pay | Admitting: Family Medicine

## 2016-12-20 DIAGNOSIS — R928 Other abnormal and inconclusive findings on diagnostic imaging of breast: Secondary | ICD-10-CM

## 2016-12-23 ENCOUNTER — Ambulatory Visit
Admission: RE | Admit: 2016-12-23 | Discharge: 2016-12-23 | Disposition: A | Discharge status: Home / Self Care | Payer: Medicare Other | Source: Ambulatory Visit | Attending: Family Medicine | Admitting: Family Medicine

## 2016-12-23 DIAGNOSIS — R922 Inconclusive mammogram: Secondary | ICD-10-CM | POA: Diagnosis not present

## 2016-12-23 DIAGNOSIS — R928 Other abnormal and inconclusive findings on diagnostic imaging of breast: Secondary | ICD-10-CM

## 2016-12-23 DIAGNOSIS — N6489 Other specified disorders of breast: Secondary | ICD-10-CM | POA: Diagnosis not present

## 2016-12-28 ENCOUNTER — Ambulatory Visit (INDEPENDENT_AMBULATORY_CARE_PROVIDER_SITE_OTHER): Payer: Self-pay | Admitting: Family Medicine

## 2016-12-29 ENCOUNTER — Encounter: Payer: Self-pay | Admitting: Family Medicine

## 2016-12-29 ENCOUNTER — Ambulatory Visit (INDEPENDENT_AMBULATORY_CARE_PROVIDER_SITE_OTHER): Payer: Medicare Other | Admitting: Family Medicine

## 2016-12-29 DIAGNOSIS — M545 Low back pain: Secondary | ICD-10-CM

## 2016-12-29 DIAGNOSIS — G8929 Other chronic pain: Secondary | ICD-10-CM

## 2016-12-29 NOTE — Patient Instructions (Signed)
Strongly consider doing physical therapy before you leave - we could also put in a referral for this up there when you're in [place]. Do the exercises in the handout - hold for 20-30 seconds and repeat 3 times each stretch. Do once or twice a day. Try to avoid extending past neutral position (sitting upright). Tylenol, tramadol, tizanidine if needed. Call me with any problems otherwise.

## 2016-12-30 NOTE — Assessment & Plan Note (Signed)
Radiographs and MRI showed extensive degenerative changes in low back.  This is cause of her pain especially with extension.  Advised against injections, surgical referral.  Shown additional exercises today.  Encouraged her to consider physical therapy as well.  Tylenol with tramadol and tizanidine as needed.  Total visit time 15 minutes - over half of which spent on counseling, answering questions.

## 2016-12-30 NOTE — Progress Notes (Signed)
PCP and consultation requested by: [name], MD  Subjective:   HPI: Patient is a 71 y.o. female here for low back pain.  11/20: Patient report she started to get pain in low back on 10/28. Started when she was at a [employer] - no injury or trauma just started getting pain central low back. Pain is 0/10 at rest but can get up to 7/10 and stabbing. She is taking tizanidine on active days which helps some. Not taking any other medicines. Does stretching in morning and an army based exercise program (husband was in the [employer]). No radiation. No numbness or tingling with this. No bowel/bladder dysfunction.  12/07/16: Patient reports she continues to have soreness midline of mid-low back. Pain level 6/10 now. Pain with bending, prolonged sitting or standing. Hasn't been able to do home exercises as much as she had the flu twice. Has been resting, taking tizanidine and over the counter medications. Using a back belt which helps. Knees also bothering her. Has tried knee braces. Prior history of bilateral partial meniscectomies. Pain also 6/10 both areas, worse with prolonged walking and going from sitting to standing. No bowel/bladder dysfunction. No numbness or tingling. No radiation of back pain.  3/14: Patient returns with pain still 6/10. Had to do a lot of baking so was standing for 6 hours and had to take frequent breaks as pain in low back was bothersome. No radiation into legs. Worse with extending, prolonged walking and standing. No numbness or tingling.  No bowel/bladder dysfunction. Doing some home exercises especially in morning and when pain is worse.  Past Medical History:  Diagnosis Date  . Allergic rhinitis   . Anemia 03/19/2013  . Anxiety   . Bouchard nodes (DJD hand)   . Carotid artery disease ([place]) 03/24/2014  . Cervical cancer screening 07/31/2015  . Diabetes mellitus type II   . DJD (degenerative joint disease), lumbar   . Fibroids   .  Glaucoma   . History of DVT (deep vein thrombosis) 1994  . Hyperlipidemia   . Hypertension   . Insomnia 02/03/2016  . Nocturia 12/18/2012  . Obesity 07/31/2015  . Palpitations 12/18/2012    Current Outpatient Prescriptions on File Prior to Visit  Medication Sig Dispense Refill  . amLODipine (NORVASC) 10 MG tablet Take 1 tablet (10 mg total) by mouth daily. 90 tablet 1  . aspirin 81 MG tablet Take 81 mg by mouth daily.      Marland Kitchen atorvastatin (LIPITOR) 40 MG tablet Take 1 tablet (40 mg total) by mouth daily. 30 tablet 3  . Chlorphen-Phenyleph-APAP (CORICIDIN D COLD/FLU/SINUS) 2-5-325 MG TABS Take 1 tablet by mouth as directed.    . Cholecalciferol (VITAMIN D) 1000 UNITS capsule Take 1,000 Units by mouth. ocassionally    . Cyanocobalamin (B-12) 1000 MCG CAPS Take 1 tablet by mouth daily.    . fluticasone (FLONASE) 50 MCG/ACT nasal spray Place 2 sprays into both nostrils daily as needed for allergies or rhinitis. 16 g 6  . glucose blood (GLUCOSE METER TEST) test strip To use with onetouch verio flex DX E11.9 100 each 6  . glucose blood (ONETOUCH VERIO) test strip Check blood sugar once daily.  DX E11.9 100 each 6  . losartan (COZAAR) 100 MG tablet TAKE 1 TABLET BY MOUTH EVERY DAY 30 tablet 0  . meloxicam (MOBIC) 15 MG tablet TAKE 1 TABLET(15 MG) BY MOUTH DAILY 30 tablet 0  . metFORMIN (GLUCOPHAGE) 500 MG tablet Take 1 tablet (500 mg total) by  mouth 2 (two) times daily with a meal. 180 tablet 3  . methocarbamol (ROBAXIN) 500 MG tablet Take 1 tablet (500 mg total) by mouth every 8 (eight) hours as needed for muscle spasms. 30 tablet 1  . metoprolol succinate (TOPROL-XL) 25 MG 24 hr tablet Take 3 tablets (75 mg total) by mouth daily. Take with or immediately following a meal. 270 tablet 3  . mometasone (NASONEX) 50 MCG/ACT nasal spray Place 2 sprays into the nose daily.    . nitroGLYCERIN (NITROSTAT) 0.4 MG SL tablet Place 0.4 mg under the tongue every 5 (five) minutes as needed for chest pain.    Marland Kitchen  tiZANidine (ZANAFLEX) 2 MG tablet TAKE 1 TABLET(2 MG) BY MOUTH AT BEDTIME AS NEEDED FOR MUSCLE SPASMS 30 tablet 0  . traMADol (ULTRAM) 50 MG tablet Take 1 tablet (50 mg total) by mouth 3 (three) times daily as needed. 40 tablet 0   No current facility-administered medications on file prior to visit.     Past Surgical History:  Procedure Laterality Date  . ABDOMINAL HYSTERECTOMY    . CHOLECYSTECTOMY    . EYE SURGERY  09/2014, 08/2014   Cataracts removal  . KNEE ARTHROSCOPY      Allergies  Allergen Reactions  . Tussionex Pennkinetic Er [Hydrocod Polst-Cpm Polst Er] Other (See Comments)    Upset stomach and loose stool     Social History   Social History  . Marital status: Married    Spouse name: Essick  . Number of children: 3  . Years of education: N/A   Occupational History  . Homemaker Retired    used to work for [employer]  . Smoking status: Former Smoker    Packs/day: 0.30    Years: 8.00    Types: Cigarettes  . Smokeless tobacco: Never Used  . Alcohol use 0.0 oz/week     Comment: Rarely---glass of wine with a meeting  . Drug use: No  . Sexual activity: Yes    Partners: Male     Comment: lives with husband, no dietary restrictions, minimizes dairy is walking more now   Other Topics Concern  . 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 Diabetes Mother   . Kidney disease Mother   . COPD Father     smoker  . Stroke Sister   . Breast cancer Sister     s/p mastectomy  . COPD Brother   . Stroke    . Diabetes Paternal Grandmother     BP (!) 179/67   Pulse 61   Ht 5\' 2"  (1.575 m)   Wt 180 lb (81.6 kg)   BMI 32.92 kg/m   Review of Systems: See HPI above.     Objective:  Physical Exam:  Gen: NAD, comfortable in  exam room  Back exam not repeated today. Back: No gross deformity, scoliosis. TTP midline at T11-L1.  No other tenderness. FROM with pain on flexion. Strength LEs 5/5 all muscle groups.   2+ MSRs in patellar and achilles tendons, equal bilaterally. Negative SLRs. Sensation intact to light touch bilaterally. Negative logroll bilateral hips Negative fabers and piriformis stretches.  Assessment & Plan:  1. Low back pain - Radiographs and MRI showed extensive degenerative changes in low back.  This is cause of her pain especially with extension.  Advised against injections, surgical referral.  Shown additional exercises today.  Encouraged her to consider physical therapy as well.  Tylenol with tramadol and tizanidine as needed.  Total visit time 15 minutes - over half of which spent on counseling, answering questions.

## 2017-01-06 ENCOUNTER — Other Ambulatory Visit: Payer: Self-pay | Admitting: Family Medicine

## 2017-01-07 ENCOUNTER — Encounter: Payer: Self-pay | Admitting: Family Medicine

## 2017-01-07 ENCOUNTER — Telehealth: Payer: Self-pay | Admitting: Family Medicine

## 2017-01-07 MED ORDER — TRAMADOL HCL 50 MG PO TABS: 50.0000 mg | ORAL_TABLET | Freq: Three times a day (TID) | ORAL | 0 refills | Status: DC | PRN

## 2017-01-07 NOTE — Telephone Encounter (Signed)
Patient can have refill just needs to sign UDS and contract

## 2017-01-07 NOTE — Telephone Encounter (Signed)
Printed prescription/contract Patient informed to pickup hardcopy/sign contract/and do UDS She verbalized understanding.

## 2017-01-07 NOTE — Telephone Encounter (Signed)
Requesting:  Tramadol Contract  none UDS  None Last OV 12/03/2016-----next one is on 03/03/2017 Last Refill   #40 with no refills on 10/01/2016  Please Advise

## 2017-01-11 ENCOUNTER — Encounter: Payer: Self-pay | Admitting: Family Medicine

## 2017-01-11 DIAGNOSIS — Z79891 Long term (current) use of opiate analgesic: Secondary | ICD-10-CM | POA: Diagnosis not present

## 2017-02-11 DIAGNOSIS — E119 Type 2 diabetes mellitus without complications: Secondary | ICD-10-CM | POA: Diagnosis not present

## 2017-02-11 LAB — HM DIABETES EYE EXAM

## 2017-02-14 ENCOUNTER — Encounter: Payer: Self-pay | Admitting: Family Medicine

## 2017-02-15 ENCOUNTER — Ambulatory Visit (HOSPITAL_BASED_OUTPATIENT_CLINIC_OR_DEPARTMENT_OTHER)
Admission: RE | Admit: 2017-02-15 | Discharge: 2017-02-15 | Disposition: A | Discharge status: Home / Self Care | Payer: Medicare Other | Source: Ambulatory Visit | Attending: Family Medicine | Admitting: Family Medicine

## 2017-02-15 ENCOUNTER — Ambulatory Visit (INDEPENDENT_AMBULATORY_CARE_PROVIDER_SITE_OTHER): Payer: Medicare Other | Admitting: Family Medicine

## 2017-02-15 ENCOUNTER — Encounter: Payer: Self-pay | Admitting: Family Medicine

## 2017-02-15 VITALS — BP 141/76 | HR 75 | Temp 98.6°F | Resp 16 | Ht 62.0 in | Wt 178.0 lb

## 2017-02-15 DIAGNOSIS — R102 Pelvic and perineal pain: Secondary | ICD-10-CM | POA: Diagnosis not present

## 2017-02-15 DIAGNOSIS — K439 Ventral hernia without obstruction or gangrene: Secondary | ICD-10-CM | POA: Insufficient documentation

## 2017-02-15 DIAGNOSIS — K449 Diaphragmatic hernia without obstruction or gangrene: Secondary | ICD-10-CM | POA: Insufficient documentation

## 2017-02-15 DIAGNOSIS — I700 Atherosclerosis of aorta: Secondary | ICD-10-CM | POA: Insufficient documentation

## 2017-02-15 DIAGNOSIS — M47896 Other spondylosis, lumbar region: Secondary | ICD-10-CM | POA: Diagnosis not present

## 2017-02-15 DIAGNOSIS — K760 Fatty (change of) liver, not elsewhere classified: Secondary | ICD-10-CM | POA: Insufficient documentation

## 2017-02-15 DIAGNOSIS — K5732 Diverticulitis of large intestine without perforation or abscess without bleeding: Secondary | ICD-10-CM | POA: Diagnosis not present

## 2017-02-15 DIAGNOSIS — R1032 Left lower quadrant pain: Secondary | ICD-10-CM | POA: Diagnosis not present

## 2017-02-15 DIAGNOSIS — M48061 Spinal stenosis, lumbar region without neurogenic claudication: Secondary | ICD-10-CM | POA: Insufficient documentation

## 2017-02-15 LAB — CBC WITH DIFFERENTIAL/PLATELET
Basophils Absolute: 0 10*3/uL (ref 0.0–0.1)
Basophils Relative: 0.6 % (ref 0.0–3.0)
EOS ABS: 0.1 10*3/uL (ref 0.0–0.7)
EOS PCT: 1.5 % (ref 0.0–5.0)
HCT: 39.6 % (ref 36.0–46.0)
HEMOGLOBIN: 13.2 g/dL (ref 12.0–15.0)
LYMPHS ABS: 3.6 10*3/uL (ref 0.7–4.0)
Lymphocytes Relative: 54.5 % — ABNORMAL HIGH (ref 12.0–46.0)
MCHC: 33.3 g/dL (ref 30.0–36.0)
MCV: 93.2 fl (ref 78.0–100.0)
MONO ABS: 0.7 10*3/uL (ref 0.1–1.0)
Monocytes Relative: 9.9 % (ref 3.0–12.0)
NEUTROS PCT: 33.5 % — AB (ref 43.0–77.0)
Neutro Abs: 2.2 10*3/uL (ref 1.4–7.7)
Platelets: 282 10*3/uL (ref 150.0–400.0)
RBC: 4.25 Mil/uL (ref 3.87–5.11)
RDW: 14.7 % (ref 11.5–15.5)
WBC: 6.6 10*3/uL (ref 4.0–10.5)

## 2017-02-15 LAB — POC URINALSYSI DIPSTICK (AUTOMATED)
BILIRUBIN UA: NEGATIVE
Glucose, UA: NEGATIVE
Ketones, UA: NEGATIVE
Leukocytes, UA: NEGATIVE
NITRITE UA: NEGATIVE
PH UA: 6 (ref 5.0–8.0)
Protein, UA: NEGATIVE
RBC UA: NEGATIVE
Spec Grav, UA: 1.01 (ref 1.010–1.025)
Urobilinogen, UA: 0.2 E.U./dL

## 2017-02-15 LAB — COMPREHENSIVE METABOLIC PANEL
ALBUMIN: 4.3 g/dL (ref 3.5–5.2)
ALK PHOS: 104 U/L (ref 39–117)
ALT: 18 U/L (ref 0–35)
AST: 21 U/L (ref 0–37)
BUN: 13 mg/dL (ref 6–23)
CO2: 29 mEq/L (ref 19–32)
CREATININE: 0.96 mg/dL (ref 0.40–1.20)
Calcium: 10.3 mg/dL (ref 8.4–10.5)
Chloride: 103 mEq/L (ref 96–112)
GFR: 73.75 mL/min (ref >60.00)
GLUCOSE: 110 mg/dL — AB (ref 70–99)
POTASSIUM: 5.2 meq/L — AB (ref 3.5–5.1)
SODIUM: 142 meq/L (ref 135–145)
TOTAL PROTEIN: 8 g/dL (ref 6.0–8.3)
Total Bilirubin: 0.6 mg/dL (ref 0.2–1.2)

## 2017-02-15 MED ORDER — IOPAMIDOL (ISOVUE-300) INJECTION 61%
100.0000 mL | Freq: Once | INTRAVENOUS | Status: AC | PRN
  Administered 2017-02-15: 100 mL via INTRAVENOUS

## 2017-02-15 NOTE — Patient Instructions (Signed)

## 2017-02-15 NOTE — Progress Notes (Signed)
Pre visit review using our clinic review tool, if applicable. No additional management support is needed unless otherwise documented below in the visit note. 

## 2017-02-15 NOTE — Progress Notes (Signed)
Patient ID: Heather Roberts, female   DOB: 1946/06/16, 71 y.o.   MRN: [id number]     Subjective:    Patient ID: Heather Roberts, female    DOB: May 25, 1946, 71 y.o.   MRN: [id number]  Chief Complaint  Patient presents with  . Abdominal Pain    HPI Patient is in today for LLQ pain since Saturday that comes and goes.  She was cleaning hard wood floors on Saturday and when she got up she felt the pain.  It feels like a cutting pain.  It radiates to back.  She took her last tramadol for pain which helped.  Also she took a Zanaflex.  Past Medical History:  Diagnosis Date  . Allergic rhinitis   . Anemia 03/19/2013  . Anxiety   . Bouchard nodes (DJD hand)   . Carotid artery disease ([place]) 03/24/2014  . Cervical cancer screening 07/31/2015  . Diabetes mellitus type II   . DJD (degenerative joint disease), lumbar   . Fibroids   . Glaucoma   . History of DVT (deep vein thrombosis) 1994  . Hyperlipidemia   . Hypertension   . Insomnia 02/03/2016  . Nocturia 12/18/2012  . Obesity 07/31/2015  . Palpitations 12/18/2012    Past Surgical History:  Procedure Laterality Date  . ABDOMINAL HYSTERECTOMY    . CHOLECYSTECTOMY    . EYE SURGERY  09/2014, 08/2014   Cataracts removal  . KNEE ARTHROSCOPY      Family History  Problem Relation Age of Onset  . Diabetes Mother   . Kidney disease Mother   . COPD Father     smoker  . Stroke Sister   . Breast cancer Sister     s/p mastectomy  . COPD Brother   . Stroke    . Diabetes Paternal Grandmother     Social History   Social History  . Marital status: Married    Spouse name: Essick  . Number of children: 3  . Years of education: N/A   Occupational History  . Homemaker Retired    used to work for [employer]  . Smoking status: Former Smoker    Packs/day: 0.30    Years: 8.00    Types: Cigarettes  . Smokeless tobacco: Never Used  . Alcohol use 0.0 oz/week     Comment:  Rarely---glass of wine with a meeting  . Drug use: No  . Sexual activity: Yes    Partners: Male     Comment: lives with husband, no dietary restrictions, minimizes dairy is walking more now   Other Topics Concern  . 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 amLODipine (NORVASC) 10 MG tablet Take 1 tablet (10 mg total) by mouth daily. 90 tablet 1  . aspirin 81 MG tablet Take 81 mg by mouth daily.      Marland Kitchen atorvastatin (LIPITOR) 40 MG tablet Take 1 tablet (40 mg total) by mouth daily. 30 tablet 3  . Chlorphen-Phenyleph-APAP (CORICIDIN D COLD/FLU/SINUS) 2-5-325 MG TABS Take 1 tablet by mouth as directed.    . Cholecalciferol (VITAMIN D) 1000 UNITS capsule Take 1,000 Units  by mouth. ocassionally    . Cyanocobalamin (B-12) 1000 MCG CAPS Take 1 tablet by mouth daily.    . fluticasone (FLONASE) 50 MCG/ACT nasal spray Place 2 sprays into both nostrils daily as needed for allergies or rhinitis. 16 g 6  . glucose blood (GLUCOSE METER TEST) test strip To use with onetouch verio flex DX E11.9 100 each 6  . glucose blood (ONETOUCH VERIO) test strip Check blood sugar once daily.  DX E11.9 100 each 6  . losartan (COZAAR) 100 MG tablet TAKE 1 TABLET BY MOUTH EVERY DAY 30 tablet 0  . meloxicam (MOBIC) 15 MG tablet TAKE 1 TABLET(15 MG) BY MOUTH DAILY 30 tablet 0  . metFORMIN (GLUCOPHAGE) 500 MG tablet Take 1 tablet (500 mg total) by mouth 2 (two) times daily with a meal. 180 tablet 3  . methocarbamol (ROBAXIN) 500 MG tablet Take 1 tablet (500 mg total) by mouth every 8 (eight) hours as needed for muscle spasms. 30 tablet 1  . metoprolol succinate (TOPROL-XL) 25 MG 24 hr tablet Take 3 tablets (75 mg total) by mouth daily. Take with or immediately following a meal. 270 tablet 3  .  mometasone (NASONEX) 50 MCG/ACT nasal spray Place 2 sprays into the nose daily.    . nitroGLYCERIN (NITROSTAT) 0.4 MG SL tablet Place 0.4 mg under the tongue every 5 (five) minutes as needed for chest pain.    Marland Kitchen tiZANidine (ZANAFLEX) 2 MG tablet TAKE 1 TABLET(2 MG) BY MOUTH AT BEDTIME AS NEEDED FOR MUSCLE SPASMS 30 tablet 0  . traMADol (ULTRAM) 50 MG tablet Take 1 tablet (50 mg total) by mouth 3 (three) times daily as needed. 40 tablet 0   No facility-administered medications prior to visit.     Allergies  Allergen Reactions  . Tussionex Pennkinetic Er [Hydrocod Polst-Cpm Polst Er] Other (See Comments)    Upset stomach and loose stool     Review of Systems  Constitutional: Negative for fever and malaise/fatigue.  HENT: Negative for congestion.   Eyes: Negative for blurred vision.  Respiratory: Negative for cough and shortness of breath.   Cardiovascular: Negative for chest pain, palpitations and leg swelling.  Gastrointestinal: Positive for abdominal pain. Negative for vomiting.       LLQ  Musculoskeletal: Negative for back pain.  Skin: Negative for rash.  Neurological: Negative for loss of consciousness and headaches.       Objective:    Physical Exam  BP (!) 141/76 (BP Location: Left Arm, Cuff Size: Large)   Pulse 75   Temp 98.6 F (37 C) (Oral)   Resp 16   Ht '5\' 2"'  (1.575 m)   Wt 178 lb (80.7 kg)   SpO2 99%   BMI 32.56 kg/m  Wt Readings from Last 3 Encounters:  02/15/17 178 lb (80.7 kg)  12/29/16 180 lb (81.6 kg)  12/15/16 181 lb 1.9 oz (82.2 kg)     Lab Results  Component Value Date   WBC 6.6 02/15/2017   HGB 13.2 02/15/2017   HCT 39.6 02/15/2017   PLT 282.0 02/15/2017   GLUCOSE 110 (H) 02/15/2017   CHOL 176 12/03/2016   TRIG 0.0 12/03/2016   HDL 55.30 12/03/2016   LDLDIRECT 118.5 10/04/2007   LDLCALC 121 (H) 12/03/2016   ALT 18 02/15/2017   AST 21 02/15/2017   NA 142 02/15/2017   K 5.2 (H) 02/15/2017   CL 103 02/15/2017   CREATININE 0.96  02/15/2017   BUN 13 02/15/2017   CO2 29 02/15/2017  TSH 1.25 12/03/2016   HGBA1C 6.8 (H) 12/03/2016   MICROALBUR 0.50 06/14/2012    Lab Results  Component Value Date   TSH 1.25 12/03/2016   Lab Results  Component Value Date   WBC 6.6 02/15/2017   HGB 13.2 02/15/2017   HCT 39.6 02/15/2017   MCV 93.2 02/15/2017   PLT 282.0 02/15/2017   Lab Results  Component Value Date   NA 142 02/15/2017   K 5.2 (H) 02/15/2017   CO2 29 02/15/2017   GLUCOSE 110 (H) 02/15/2017   BUN 13 02/15/2017   CREATININE 0.96 02/15/2017   BILITOT 0.6 02/15/2017   ALKPHOS 104 02/15/2017   AST 21 02/15/2017   ALT 18 02/15/2017   PROT 8.0 02/15/2017   ALBUMIN 4.3 02/15/2017   CALCIUM 10.3 02/15/2017   GFR 73.75 02/15/2017   Lab Results  Component Value Date   CHOL 176 12/03/2016   Lab Results  Component Value Date   HDL 55.30 12/03/2016   Lab Results  Component Value Date   LDLCALC 121 (H) 12/03/2016   Lab Results  Component Value Date   TRIG 0.0 12/03/2016   Lab Results  Component Value Date   CHOLHDL 3 12/03/2016   Lab Results  Component Value Date   HGBA1C 6.8 (H) 12/03/2016       Assessment & Plan:   Problem List Items Addressed This Visit    None    Visit Diagnoses    LLQ pain    -  Primary   Relevant Orders   CT Abdomen Pelvis W Contrast   Comp Met (CMET) (Completed)   CBC w/Diff (Completed)   Suprapubic abdominal pain       Relevant Orders   POCT Urinalysis Dipstick (Automated) (Completed)   Comp Met (CMET) (Completed)   CBC w/Diff (Completed)      I am having Ms. Garfield Cornea maintain her aspirin, Vitamin D, fluticasone, losartan, B-12, glucose blood, glucose blood, nitroGLYCERIN, Chlorphen-Phenyleph-APAP, mometasone, amLODipine, metFORMIN, atorvastatin, methocarbamol, meloxicam, metoprolol succinate, tiZANidine, and traMADol.  No orders of the defined types were placed in this encounter.   Ann Held, DO

## 2017-02-16 ENCOUNTER — Telehealth: Payer: Self-pay | Admitting: Family Medicine

## 2017-02-16 ENCOUNTER — Other Ambulatory Visit: Payer: Self-pay | Admitting: Family Medicine

## 2017-02-16 DIAGNOSIS — K5732 Diverticulitis of large intestine without perforation or abscess without bleeding: Secondary | ICD-10-CM

## 2017-02-16 MED ORDER — AMOXICILLIN-POT CLAVULANATE 875-125 MG PO TABS: 1.0000 | ORAL_TABLET | Freq: Two times a day (BID) | ORAL | 0 refills | Status: DC

## 2017-02-16 NOTE — Telephone Encounter (Signed)
Thank you :)

## 2017-02-16 NOTE — Telephone Encounter (Signed)
Saw Dr. Etter Sjogren yesterday for LLQ pain. Her CT was done yesterday evening- results show mild sigmoid diverticulitis. It looks like the CT was ordered yesterday but she was not started on abx as she was not known to have diverticulitis Called pt- she has never had this condition in the past Will start her on augmenmtin 875 BID for 10 days. Counseled her to eat a low residue diet if hungry- liquids only are ok if not hungry- for the next 2-3 days.  She will alert [place] if not feeling better in 1-2 days- Sooner if worse.    Will route message to her PCP Dr. Charlett Blake- she is seeing her in a couple of weeks for follow-up, already scheduled Erline Levine- a couple of other details are noted on her CT (fatty liver, atherosclerosis, degen change of spine) which I will defer to you Meds ordered this encounter  Medications  . amoxicillin-clavulanate (AUGMENTIN) 875-125 MG tablet    Sig: Take 1 tablet by mouth 2 (two) times daily.    Dispense:  20 tablet    Refill:  0    Labs   Results for orders placed or performed in visit on 02/15/17  Comp Met (CMET)  Result Value Ref Range   Sodium 142 135 - 145 mEq/L   Potassium 5.2 (H) 3.5 - 5.1 mEq/L   Chloride 103 96 - 112 mEq/L   CO2 29 19 - 32 mEq/L   Glucose, Bld 110 (H) 70 - 99 mg/dL   BUN 13 6 - 23 mg/dL   Creatinine, Ser 0.96 0.40 - 1.20 mg/dL   Total Bilirubin 0.6 0.2 - 1.2 mg/dL   Alkaline Phosphatase 104 39 - 117 U/L   AST 21 0 - 37 U/L   ALT 18 0 - 35 U/L   Total Protein 8.0 6.0 - 8.3 g/dL   Albumin 4.3 3.5 - 5.2 g/dL   Calcium 10.3 8.4 - 10.5 mg/dL   GFR 73.75 >60.00 mL/min  CBC w/Diff  Result Value Ref Range   WBC 6.6 4.0 - 10.5 K/uL   RBC 4.25 3.87 - 5.11 Mil/uL   Hemoglobin 13.2 12.0 - 15.0 g/dL   HCT 39.6 36.0 - 46.0 %   MCV 93.2 78.0 - 100.0 fl   MCHC 33.3 30.0 - 36.0 g/dL   RDW 14.7 11.5 - 15.5 %   Platelets 282.0 150.0 - 400.0 K/uL   Neutrophils Relative % 33.5 (L) 43.0 - 77.0 %   Lymphocytes Relative 54.5 (H) 12.0 - 46.0 %   Monocytes  Relative 9.9 3.0 - 12.0 %   Eosinophils Relative 1.5 0.0 - 5.0 %   Basophils Relative 0.6 0.0 - 3.0 %   Neutro Abs 2.2 1.4 - 7.7 K/uL   Lymphs Abs 3.6 0.7 - 4.0 K/uL   Monocytes Absolute 0.7 0.1 - 1.0 K/uL   Eosinophils Absolute 0.1 0.0 - 0.7 K/uL   Basophils Absolute 0.0 0.0 - 0.1 K/uL  POCT Urinalysis Dipstick (Automated)  Result Value Ref Range   Color, UA yellow    Clarity, UA clear    Glucose, UA neg    Bilirubin, UA neg    Ketones, UA neg    Spec Grav, UA 1.010 1.010 - 1.025   Blood, UA neg    pH, UA 6.0 5.0 - 8.0   Protein, UA neg    Urobilinogen, UA 0.2 0.2 or 1.0 E.U./dL   Nitrite, UA neg    Leukocytes, UA Negative Negative

## 2017-02-17 NOTE — Telephone Encounter (Signed)
The ct was routed to me and I asked nurses to do flagyl and cipro --- augmentin is fine -- I just don't want all of the meds to be sent in

## 2017-02-18 ENCOUNTER — Encounter: Payer: Self-pay | Admitting: Family Medicine

## 2017-02-18 NOTE — Telephone Encounter (Signed)
error:[id number] ° °

## 2017-02-18 NOTE — Telephone Encounter (Signed)
Called today and LMOM for pt- please let me know how she is feeling also also take just augmentin OR cipro and flagyl- does not need all 3

## 2017-02-28 ENCOUNTER — Telehealth: Payer: Self-pay | Admitting: *Deleted

## 2017-02-28 NOTE — Telephone Encounter (Signed)
Pt declines AWV.

## 2017-03-03 ENCOUNTER — Other Ambulatory Visit: Payer: Self-pay | Admitting: Family Medicine

## 2017-03-03 ENCOUNTER — Encounter: Payer: Self-pay | Admitting: Family Medicine

## 2017-03-03 ENCOUNTER — Ambulatory Visit (INDEPENDENT_AMBULATORY_CARE_PROVIDER_SITE_OTHER): Payer: Medicare Other | Admitting: Family Medicine

## 2017-03-03 DIAGNOSIS — G8929 Other chronic pain: Secondary | ICD-10-CM

## 2017-03-03 DIAGNOSIS — I10 Essential (primary) hypertension: Secondary | ICD-10-CM

## 2017-03-03 DIAGNOSIS — Z8719 Personal history of other diseases of the digestive system: Secondary | ICD-10-CM | POA: Diagnosis not present

## 2017-03-03 DIAGNOSIS — E1169 Type 2 diabetes mellitus with other specified complication: Secondary | ICD-10-CM

## 2017-03-03 DIAGNOSIS — M545 Low back pain: Secondary | ICD-10-CM

## 2017-03-03 DIAGNOSIS — E669 Obesity, unspecified: Secondary | ICD-10-CM | POA: Diagnosis not present

## 2017-03-03 DIAGNOSIS — E6609 Other obesity due to excess calories: Secondary | ICD-10-CM

## 2017-03-03 DIAGNOSIS — R922 Inconclusive mammogram: Secondary | ICD-10-CM | POA: Diagnosis not present

## 2017-03-03 DIAGNOSIS — E785 Hyperlipidemia, unspecified: Secondary | ICD-10-CM | POA: Diagnosis not present

## 2017-03-03 DIAGNOSIS — R9230 Dense breasts, unspecified: Secondary | ICD-10-CM

## 2017-03-03 HISTORY — DX: Personal history of other diseases of the digestive system: Z87.19

## 2017-03-03 HISTORY — DX: Dense breasts, unspecified: R92.30

## 2017-03-03 HISTORY — DX: Inconclusive mammogram: R92.2

## 2017-03-03 LAB — LIPID PANEL
CHOLESTEROL: 239 mg/dL — AB (ref 0–200)
HDL: 75 mg/dL (ref >39.00)
LDL CALC: 139 mg/dL — AB (ref 0–99)
NonHDL: 163.67
TRIGLYCERIDES: 121 mg/dL (ref 0.0–149.0)
Total CHOL/HDL Ratio: 3
VLDL: 24.2 mg/dL (ref 0.0–40.0)

## 2017-03-03 LAB — HEMOGLOBIN A1C: Hgb A1c MFr Bld: 6.6 % — ABNORMAL HIGH (ref 4.6–6.5)

## 2017-03-03 MED ORDER — TIZANIDINE HCL 2 MG PO TABS: ORAL_TABLET | ORAL | 1 refills | Status: DC

## 2017-03-03 MED ORDER — ATORVASTATIN CALCIUM 40 MG PO TABS: ORAL_TABLET | ORAL | 3 refills | Status: DC

## 2017-03-03 NOTE — Assessment & Plan Note (Signed)
Encouraged DASH diet, decrease po intake and increase exercise as tolerated. Needs 7-8 hours of sleep nightly. Avoid trans fats, eat small, frequent meals every 4-5 hours with lean proteins, complex carbs and healthy fats. Minimize simple carbs, bariatric referral today

## 2017-03-03 NOTE — Assessment & Plan Note (Signed)
Encouraged moist heat and gentle stretching as tolerated. May try NSAIDs and prescription meds as directed and report if symptoms worsen or seek immediate care. Continue Lidocaine patches and or NSAIDs, Menthol patches. Consider TENS unit and/or referral to PT/Sports Med. Continue Turmeric

## 2017-03-03 NOTE — Assessment & Plan Note (Signed)
hgba1c acceptable, minimize simple carbs. Increase exercise as tolerated. Continue current meds 

## 2017-03-03 NOTE — Assessment & Plan Note (Signed)
Follow up imaging unremarkable return to screening MGM

## 2017-03-03 NOTE — Assessment & Plan Note (Signed)
Tolerating statin, encouraged heart healthy diet, avoid trans fats, minimize simple carbs and saturated fats. Increase exercise as tolerated 

## 2017-03-03 NOTE — Patient Instructions (Addendum)
Call Dr Barbaraann Barthel if back gets worse   Carbohydrate Counting for Diabetes Mellitus, Adult Carbohydrate counting is a method for keeping track of how many carbohydrates you eat. Eating carbohydrates naturally increases the amount of sugar (glucose) in the blood. Counting how many carbohydrates you eat helps keep your blood glucose within normal limits, which helps you manage your diabetes (diabetes mellitus). It is important to know how many carbohydrates you can safely have in each meal. This is different for every person. A diet and nutrition specialist (registered dietitian) can help you make a meal plan and calculate how many carbohydrates you should have at each meal and snack. Carbohydrates are found in the following foods:  Grains, such as breads and cereals.  Dried beans and soy products.  Starchy vegetables, such as potatoes, peas, and corn.  Fruit and fruit juices.  Milk and yogurt.  Sweets and snack foods, such as cake, cookies, candy, chips, and soft drinks. How do I count carbohydrates? There are two ways to count carbohydrates in food. You can use either of the methods or a combination of both. Reading "Nutrition Facts" on packaged food  The "Nutrition Facts" list is included on the labels of almost all packaged foods and beverages in the [place] It includes:  The serving size.  Information about nutrients in each serving, including the grams (g) of carbohydrate per serving. To use the "Nutrition Facts":  Decide how many servings you will have.  Multiply the number of servings by the number of carbohydrates per serving.  The resulting number is the total amount of carbohydrates that you will be having. Learning standard serving sizes of other foods  When you eat foods containing carbohydrates that are not packaged or do not include "Nutrition Facts" on the label, you need to measure the servings in order to count the amount of carbohydrates:  Measure the foods that you  will eat with a food scale or measuring cup, if needed.  Decide how many standard-size servings you will eat.  Multiply the number of servings by 15. Most carbohydrate-rich foods have about 15 g of carbohydrates per serving.  For example, if you eat 8 oz (170 g) of strawberries, you will have eaten 2 servings and 30 g of carbohydrates (2 servings x 15 g = 30 g).  For foods that have more than one food mixed, such as soups and casseroles, you must count the carbohydrates in each food that is included. The following list contains standard serving sizes of common carbohydrate-rich foods. Each of these servings has about 15 g of carbohydrates:   hamburger bun or  English muffin.   oz (15 mL) syrup.   oz (14 g) jelly.  1 slice of bread.  1 six-inch tortilla.  3 oz (85 g) cooked rice or pasta.  4 oz (113 g) cooked dried beans.  4 oz (113 g) starchy vegetable, such as peas, corn, or potatoes.  4 oz (113 g) hot cereal.  4 oz (113 g) mashed potatoes or  of a large baked potato.  4 oz (113 g) canned or frozen fruit.  4 oz (120 mL) fruit juice.  4-6 crackers.  6 chicken nuggets.  6 oz (170 g) unsweetened dry cereal.  6 oz (170 g) plain fat-free yogurt or yogurt sweetened with artificial sweeteners.  8 oz (240 mL) milk.  8 oz (170 g) fresh fruit or one small piece of fruit.  24 oz (680 g) popped popcorn. Example of carbohydrate counting Sample meal  3 oz (85 g) chicken breast.  6 oz (170 g) brown rice.  4 oz (113 g) corn.  8 oz (240 mL) milk.  8 oz (170 g) strawberries with sugar-free whipped topping. Carbohydrate calculation  1. Identify the foods that contain carbohydrates:  Rice.  Corn.  Milk.  Strawberries. 2. Calculate how many servings you have of each food:  2 servings rice.  1 serving corn.  1 serving milk.  1 serving strawberries. 3. Multiply each number of servings by 15 g:  2 servings rice x 15 g = 30 g.  1 serving corn x 15 g =  15 g.  1 serving milk x 15 g = 15 g.  1 serving strawberries x 15 g = 15 g. 4. Add together all of the amounts to find the total grams of carbohydrates eaten:  30 g + 15 g + 15 g + 15 g = 75 g of carbohydrates total. This information is not intended to replace advice given to you by your health care provider. Make sure you discuss any questions you have with your health care provider. Document Released: 10/04/2005 Document Revised: 04/23/2016 Document Reviewed: 03/17/2016 Elsevier Interactive Patient Education  2017 [employer].

## 2017-03-03 NOTE — Progress Notes (Signed)
Patient ID: Heather Roberts, female   DOB: 1946-04-27, 71 y.o.   MRN: [id number]   Subjective:  I acted as a Education administrator for [name], [place], [place]   Patient ID: Heather Roberts, female    DOB: 1945-12-05, 71 y.o.   MRN: [id number]  Chief Complaint  Patient presents with  . Diabetes    43-month F/U.  Marland Kitchen Back Pain    Diabetes  She presents for her follow-up diabetic visit. She has type 2 diabetes mellitus. Pertinent negatives for hypoglycemia include no headaches. Pertinent negatives for diabetes include no blurred vision and no chest pain. Risk factors for coronary artery disease include diabetes mellitus, hypertension and stress. Her breakfast blood glucose is taken between 6-7 am. Her breakfast blood glucose range is generally 110-130 mg/dl.  Back Pain  This is a chronic problem. The pain is present in the gluteal. The pain is at a severity of 4/10. Stiffness is present all day. Pertinent negatives include no chest pain, fever or headaches. The treatment provided no relief.    Patient is in today for a 54-month follow up for Type II Diabetes. Patient states that her glucose reading this morning was 124; but have been as high as 168 and as low as 98. Patient is still having ongoing concerns with lower back pain. Patient has a Hx of HTN, Type II Diabetes, eczema, osteoarthritis, anxiety. Patient has no additional concerns noted at this time. She responded to the antibiotics and no abdominal pain at this time. She notes some mild swelling over ankles with walking and some leg aching at times. No polyuria or polydipsia. Denies CP/palp/SOB/HA/congestion/fevers/GI or GU c/o. Taking meds as prescribed  Patient Care Team: Mosie Lukes, MD as PCP - General (Family Medicine) Stanford Breed Denice Bors, MD as Consulting Physician (Cardiology)   Past Medical History:  Diagnosis Date  . Allergic rhinitis   . Anemia 03/19/2013  . Anxiety   . Bouchard nodes (DJD hand)   . Carotid artery  disease ([place]) 03/24/2014  . Cervical cancer screening 07/31/2015  . Dense breast tissue 03/03/2017  . Diabetes mellitus type II   . DJD (degenerative joint disease), lumbar   . Fibroids   . Glaucoma   . H/O diverticulitis of colon 03/03/2017  . History of DVT (deep vein thrombosis) 1994  . Hyperlipidemia   . Hypertension   . Insomnia 02/03/2016  . Nocturia 12/18/2012  . Obesity 07/31/2015  . Palpitations 12/18/2012    Past Surgical History:  Procedure Laterality Date  . ABDOMINAL HYSTERECTOMY    . CHOLECYSTECTOMY    . EYE SURGERY  09/2014, 08/2014   Cataracts removal  . KNEE ARTHROSCOPY      Family History  Problem Relation Age of Onset  . Diabetes Mother   . Kidney disease Mother   . COPD Father        smoker  . Stroke Sister   . Breast cancer Sister        s/p mastectomy  . COPD Brother   . Stroke Unknown   . Diabetes Paternal Grandmother     Social History   Social History  . Marital status: Married    Spouse name: Essick  . Number of children: 3  . Years of education: N/A   Occupational History  . Homemaker Retired    used to work for [employer]  . Smoking status: Former Smoker    Packs/day: 0.30    Years:  8.00    Types: Cigarettes  . Smokeless tobacco: Never Used  . Alcohol use 0.0 oz/week     Comment: Rarely---glass of wine with a meeting  . Drug use: No  . Sexual activity: Yes    Partners: Male     Comment: lives with husband, no dietary restrictions, minimizes dairy is walking more now   Other Topics Concern  . 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 amLODipine (NORVASC) 10 MG tablet Take 1 tablet (10 mg total) by mouth daily. 90 tablet 1  . aspirin 81 MG tablet Take 81 mg by mouth  daily.      Marland Kitchen atorvastatin (LIPITOR) 40 MG tablet Take 1 tablet (40 mg total) by mouth daily. 30 tablet 3  . Chlorphen-Phenyleph-APAP (CORICIDIN D COLD/FLU/SINUS) 2-5-325 MG TABS Take 1 tablet by mouth as directed.    . Cholecalciferol (VITAMIN D) 1000 UNITS capsule Take 1,000 Units by mouth. ocassionally    . Cyanocobalamin (B-12) 1000 MCG CAPS Take 1 tablet by mouth daily.    . fluticasone (FLONASE) 50 MCG/ACT nasal spray Place 2 sprays into both nostrils daily as needed for allergies or rhinitis. 16 g 6  . glucose blood (GLUCOSE METER TEST) test strip To use with onetouch verio flex DX E11.9 100 each 6  . glucose blood (ONETOUCH VERIO) test strip Check blood sugar once daily.  DX E11.9 100 each 6  . losartan (COZAAR) 100 MG tablet TAKE 1 TABLET BY MOUTH EVERY DAY 30 tablet 0  . metFORMIN (GLUCOPHAGE) 500 MG tablet Take 1 tablet (500 mg total) by mouth 2 (two) times daily with a meal. 180 tablet 3  . mometasone (NASONEX) 50 MCG/ACT nasal spray Place 2 sprays into the nose daily.    . nitroGLYCERIN (NITROSTAT) 0.4 MG SL tablet Place 0.4 mg under the tongue every 5 (five) minutes as needed for chest pain.    . traMADol (ULTRAM) 50 MG tablet Take 1 tablet (50 mg total) by mouth 3 (three) times daily as needed. 40 tablet 0  . tiZANidine (ZANAFLEX) 2 MG tablet TAKE 1 TABLET(2 MG) BY MOUTH AT BEDTIME AS NEEDED FOR MUSCLE SPASMS 30 tablet 0  . amoxicillin-clavulanate (AUGMENTIN) 875-125 MG tablet Take 1 tablet by mouth 2 (two) times daily. (Patient not taking: Reported on 03/03/2017) 20 tablet 0  . meloxicam (MOBIC) 15 MG tablet TAKE 1 TABLET(15 MG) BY MOUTH DAILY (Patient not taking: Reported on 03/03/2017) 30 tablet 0  . methocarbamol (ROBAXIN) 500 MG tablet Take 1 tablet (500 mg total) by mouth every 8 (eight) hours as needed for muscle spasms. (Patient not taking: Reported on 03/03/2017) 30 tablet 1  . metoprolol succinate (TOPROL-XL) 25 MG 24 hr tablet Take 3 tablets (75 mg total) by mouth daily. Take  with or immediately following a meal. (Patient not taking: Reported on 03/03/2017) 270 tablet 3  . tiZANidine (ZANAFLEX) 2 MG tablet TAKE 1 TABLET(2 MG) BY MOUTH AT BEDTIME AS NEEDED FOR MUSCLE SPASMS (Patient not taking: Reported on 03/03/2017) 30 tablet 0   No facility-administered medications prior to visit.     Allergies  Allergen Reactions  . Tussionex Pennkinetic Er [Hydrocod Polst-Cpm Polst  Er] Other (See Comments)    Upset stomach and loose stool     Review of Systems  Constitutional: Negative for fever and malaise/fatigue.  HENT: Negative for congestion.   Eyes: Negative for blurred vision.  Respiratory: Negative for cough and shortness of breath.   Cardiovascular: Negative for chest pain, palpitations and leg swelling.  Gastrointestinal: Negative for vomiting.  Musculoskeletal: Positive for back pain.  Skin: Negative for rash.  Neurological: Negative for loss of consciousness and headaches.       Objective:    Physical Exam  Constitutional: She is oriented to person, place, and time. She appears well-developed and well-nourished. No distress.  HENT:  Head: Normocephalic and atraumatic.  Eyes: Conjunctivae are normal.  Neck: Normal range of motion. No thyromegaly present.  Cardiovascular: Normal rate and regular rhythm.   Pulmonary/Chest: Effort normal and breath sounds normal. She has no wheezes.  Abdominal: Soft. Bowel sounds are normal. There is no tenderness.  Musculoskeletal: She exhibits no edema or deformity.  Neurological: She is alert and oriented to person, place, and time.  Skin: Skin is warm and dry. She is not diaphoretic.  Psychiatric: She has a normal mood and affect.    BP (!) 144/78   Pulse 69   Temp 98.4 F (36.9 C) (Oral)   Wt 180 lb 12.8 oz (82 kg)   SpO2 99% Comment: RA  BMI 33.07 kg/m  Wt Readings from Last 3 Encounters:  03/03/17 180 lb 12.8 oz (82 kg)  02/15/17 178 lb (80.7 kg)  12/29/16 180 lb (81.6 kg)   BP Readings from Last 3  Encounters:  03/03/17 (!) 144/78  02/15/17 (!) 141/76  12/29/16 (!) 179/67     Immunization History  Administered Date(s) Administered  . Influenza Split 06/23/2012  . Influenza Whole 08/09/2008, 07/31/2009, 06/19/2010  . Influenza, High Dose Seasonal PF 07/08/2016  . Influenza,inj,Quad PF,36+ Mos 06/25/2013, 07/22/2014, 07/31/2015  . Pneumococcal Conjugate-13 02/03/2016  . Pneumococcal Polysaccharide-23 08/06/2011  . Td 12/16/2004, 09/02/2016  . Zoster 01/03/2007    Health Maintenance  Topic Date Due  . FOOT EXAM  12/01/2016  . INFLUENZA VACCINE  05/18/2017  . HEMOGLOBIN A1C  06/02/2017  . OPHTHALMOLOGY EXAM  02/11/2018  . MAMMOGRAM  12/17/2018  . Fecal DNA (Cologuard)  02/09/2019  . TETANUS/TDAP  09/02/2026  . DEXA SCAN  Completed  . Hepatitis C Screening  Completed  . PNA vac Low Risk Adult  Completed    Lab Results  Component Value Date   WBC 6.6 02/15/2017   HGB 13.2 02/15/2017   HCT 39.6 02/15/2017   PLT 282.0 02/15/2017   GLUCOSE 110 (H) 02/15/2017   CHOL 176 12/03/2016   TRIG 0.0 12/03/2016   HDL 55.30 12/03/2016   LDLDIRECT 118.5 10/04/2007   LDLCALC 121 (H) 12/03/2016   ALT 18 02/15/2017   AST 21 02/15/2017   NA 142 02/15/2017   K 5.2 (H) 02/15/2017   CL 103 02/15/2017   CREATININE 0.96 02/15/2017   BUN 13 02/15/2017   CO2 29 02/15/2017   TSH 1.25 12/03/2016   HGBA1C 6.8 (H) 12/03/2016   MICROALBUR 0.50 06/14/2012    Lab Results  Component Value Date   TSH 1.25 12/03/2016   Lab Results  Component Value Date   WBC 6.6 02/15/2017   HGB 13.2 02/15/2017   HCT 39.6 02/15/2017   MCV 93.2 02/15/2017   PLT 282.0 02/15/2017   Lab Results  Component Value Date   NA 142 02/15/2017   K 5.2 (  H) 02/15/2017   CO2 29 02/15/2017   GLUCOSE 110 (H) 02/15/2017   BUN 13 02/15/2017   CREATININE 0.96 02/15/2017   BILITOT 0.6 02/15/2017   ALKPHOS 104 02/15/2017   AST 21 02/15/2017   ALT 18 02/15/2017   PROT 8.0 02/15/2017   ALBUMIN 4.3 02/15/2017    CALCIUM 10.3 02/15/2017   GFR 73.75 02/15/2017   Lab Results  Component Value Date   CHOL 176 12/03/2016   Lab Results  Component Value Date   HDL 55.30 12/03/2016   Lab Results  Component Value Date   LDLCALC 121 (H) 12/03/2016   Lab Results  Component Value Date   TRIG 0.0 12/03/2016   Lab Results  Component Value Date   CHOLHDL 3 12/03/2016   Lab Results  Component Value Date   HGBA1C 6.8 (H) 12/03/2016         Assessment & Plan:   Problem List Items Addressed This Visit    Diabetes mellitus type 2 in obese ([place])    hgba1c acceptable, minimize simple carbs. Increase exercise as tolerated. Continue current meds      Relevant Orders   Hemoglobin A1c   Lipid panel   Hemoglobin A1c   Hyperlipidemia associated with type 2 diabetes mellitus ([facility])    Tolerating statin, encouraged heart healthy diet, avoid trans fats, minimize simple carbs and saturated fats. Increase exercise as tolerated      Relevant Orders   Lipid panel   Essential hypertension    Adequately controlled, no changes to meds, did not take her Amlodipine this am. Encouraged heart healthy diet such as the DASH diet and exercise as tolerated.       Relevant Orders   CBC   Comprehensive metabolic panel   TSH   Low back pain    Encouraged moist heat and gentle stretching as tolerated. May try NSAIDs and prescription meds as directed and report if symptoms worsen or seek immediate care. Continue Lidocaine patches and or NSAIDs, Menthol patches. Consider TENS unit and/or referral to PT/Sports Med. Continue Turmeric      Relevant Medications   tiZANidine (ZANAFLEX) 2 MG tablet   Obesity    Encouraged DASH diet, decrease po intake and increase exercise as tolerated. Needs 7-8 hours of sleep nightly. Avoid trans fats, eat small, frequent meals every 4-5 hours with lean proteins, complex carbs and healthy fats. Minimize simple carbs, bariatric referral today      Dense breast tissue    Follow up  imaging unremarkable return to screening MGM      H/O diverticulitis of colon    Responded well to antibiotics. Encouraged hi fiber diet, water 64 oz daily and increase exercise. Add Benefiber daily. Report any recurrent symptoms.         I have discontinued Ms. Chrissie Noa Williams's methocarbamol, meloxicam, metoprolol succinate, and amoxicillin-clavulanate. I am also having her maintain her aspirin, Vitamin D, fluticasone, losartan, B-12, glucose blood, glucose blood, nitroGLYCERIN, Chlorphen-Phenyleph-APAP, mometasone, amLODipine, metFORMIN, atorvastatin, traMADol, and tiZANidine.  Meds ordered this encounter  Medications  . tiZANidine (ZANAFLEX) 2 MG tablet    Sig: TAKE 1 TABLET(2 MG) BY MOUTH AT BEDTIME AS NEEDED FOR MUSCLE SPASMS    Dispense:  30 tablet    Refill:  1    CMA served as scribe during this visit. History, Physical and Plan performed by medical provider. Documentation and orders reviewed and attested to.  [name], MD

## 2017-03-03 NOTE — Assessment & Plan Note (Signed)
Adequately controlled, no changes to meds, did not take her Amlodipine this am. Encouraged heart healthy diet such as the DASH diet and exercise as tolerated.

## 2017-03-03 NOTE — Assessment & Plan Note (Signed)
Responded well to antibiotics. Encouraged hi fiber diet, water 64 oz daily and increase exercise. Add Benefiber daily. Report any recurrent symptoms.

## 2017-03-03 NOTE — Progress Notes (Signed)
Pre visit review using our clinic review tool, if applicable. No additional management support is needed unless otherwise documented below in the visit note. 

## 2017-03-25 ENCOUNTER — Telehealth: Payer: Self-pay | Admitting: Family Medicine

## 2017-03-25 DIAGNOSIS — M549 Dorsalgia, unspecified: Secondary | ICD-10-CM

## 2017-03-25 NOTE — Telephone Encounter (Signed)
Patient is sore/pain in left side lower back, soreness not going away and going up her back some. She has been seen for this by PCP and other providers.  She has had this pain/soreness since Wednesday and not getting better..  She has tizanidine but was not sure if ok to take--Let me know.  Also did take all the tramadol previously prescirbed and they did work pretty well. She is scheduled to see Dr. Charlett Blake for this pain on Tuesday 03/29/17, but did want advise on if ok to take tizanidine

## 2017-03-25 NOTE — Telephone Encounter (Signed)
Patient informed and ok to wait for that appointment. Canceled her appt with PCP on Tuesday.

## 2017-03-25 NOTE — Telephone Encounter (Signed)
I reviewed Dr. Frederik Pear note and it stated that if her back pain worsened she recommended that patient see Dr. Barbaraann Barthel. I have placed this referral.

## 2017-03-25 NOTE — Telephone Encounter (Signed)
Caller name: Relationship to patient: Self Can be reached: [phone] Pharmacy:  Reason for call: States she is in pain and was told to call Shirlean Mylar when she's hurting

## 2017-03-29 ENCOUNTER — Ambulatory Visit: Payer: Medicare Other | Admitting: Family Medicine

## 2017-03-30 ENCOUNTER — Ambulatory Visit (INDEPENDENT_AMBULATORY_CARE_PROVIDER_SITE_OTHER): Payer: Medicare Other | Admitting: Family Medicine

## 2017-03-30 ENCOUNTER — Encounter: Payer: Self-pay | Admitting: Family Medicine

## 2017-03-30 DIAGNOSIS — G8929 Other chronic pain: Secondary | ICD-10-CM | POA: Diagnosis not present

## 2017-03-30 DIAGNOSIS — M545 Low back pain: Secondary | ICD-10-CM

## 2017-03-30 NOTE — Patient Instructions (Signed)
Your back pain is due to a combination of arthritis and lumbar strain. Continue the home exercises for your back. Start physical therapy also - typically start twice a week then back down to once a week as you're improving. Aleve 1-2 tabs twice a day with food - take for 7-10 days then as needed. Heat or ice, whichever feels better at this point for 15 minutes at a time 3-4 times a day. Tramadol, tizanidine if needed.   If you need more of the tizanidine I can send this in.  You also have peroneal tendinitis. The aleve will help with this. Do home theraband exercises 3 sets of 10 once a day. Icing as needed. Arch supports are important - avoid flat shoes as much as possible. Follow up with me in 5-6 weeks.

## 2017-03-31 NOTE — Progress Notes (Signed)
PCP and consultation requested by: Mosie Lukes, MD  Subjective:   HPI: Patient is a 71 y.o. female here for low back pain.  11/20: Patient report she started to get pain in low back on 10/28. Started when she was at a [employer] - no injury or trauma just started getting pain central low back. Pain is 0/10 at rest but can get up to 7/10 and stabbing. She is taking tizanidine on active days which helps some. Not taking any other medicines. Does stretching in morning and an army based exercise program (husband was in the [employer]). No radiation. No numbness or tingling with this. No bowel/bladder dysfunction.  12/07/16: Patient reports she continues to have soreness midline of mid-low back. Pain level 6/10 now. Pain with bending, prolonged sitting or standing. Hasn't been able to do home exercises as much as she had the flu twice. Has been resting, taking tizanidine and over the counter medications. Using a back belt which helps. Knees also bothering her. Has tried knee braces. Prior history of bilateral partial meniscectomies. Pain also 6/10 both areas, worse with prolonged walking and going from sitting to standing. No bowel/bladder dysfunction. No numbness or tingling. No radiation of back pain.  3/14: Patient returns with pain still 6/10. Had to do a lot of baking so was standing for 6 hours and had to take frequent breaks as pain in low back was bothersome. No radiation into legs. Worse with extending, prolonged walking and standing. No numbness or tingling.  No bowel/bladder dysfunction. Doing some home exercises especially in morning and when pain is worse.  6/13: Patient reports she's developed worsening left sided low back pain about 5 days ago. Pain level 4/10, up to 7/10 and sharp. No radiation past buttocks on left side. No numbness or tingling. No bowel/bladder dysfunction. Worse getting in and out of vehicles. Taking tizanidine, finished with  tramadol. Doing home exercises and stretches.  Past Medical History:  Diagnosis Date  . Allergic rhinitis   . Anemia 03/19/2013  . Anxiety   . Bouchard nodes (DJD hand)   . Carotid artery disease ([place]) 03/24/2014  . Cervical cancer screening 07/31/2015  . Dense breast tissue 03/03/2017  . Diabetes mellitus type II   . DJD (degenerative joint disease), lumbar   . Fibroids   . Glaucoma   . H/O diverticulitis of colon 03/03/2017  . History of DVT (deep vein thrombosis) 1994  . Hyperlipidemia   . Hypertension   . Insomnia 02/03/2016  . Nocturia 12/18/2012  . Obesity 07/31/2015  . Palpitations 12/18/2012    Current Outpatient Prescriptions on File Prior to Visit  Medication Sig Dispense Refill  . amLODipine (NORVASC) 10 MG tablet Take 1 tablet (10 mg total) by mouth daily. 90 tablet 1  . aspirin 81 MG tablet Take 81 mg by mouth daily.      Marland Kitchen atorvastatin (LIPITOR) 40 MG tablet Take 1 and 1/2 by mouth daily 45 tablet 3  . Chlorphen-Phenyleph-APAP (CORICIDIN D COLD/FLU/SINUS) 2-5-325 MG TABS Take 1 tablet by mouth as directed.    . Cholecalciferol (VITAMIN D) 1000 UNITS capsule Take 1,000 Units by mouth. ocassionally    . Cyanocobalamin (B-12) 1000 MCG CAPS Take 1 tablet by mouth daily.    . fluticasone (FLONASE) 50 MCG/ACT nasal spray Place 2 sprays into both nostrils daily as needed for allergies or rhinitis. 16 g 6  . glucose blood (GLUCOSE METER TEST) test strip To use with onetouch verio flex DX E11.9 100 each 6  .  glucose blood (ONETOUCH VERIO) test strip Check blood sugar once daily.  DX E11.9 100 each 6  . losartan (COZAAR) 100 MG tablet TAKE 1 TABLET BY MOUTH EVERY DAY 30 tablet 0  . metFORMIN (GLUCOPHAGE) 500 MG tablet Take 1 tablet (500 mg total) by mouth 2 (two) times daily with a meal. 180 tablet 3  . mometasone (NASONEX) 50 MCG/ACT nasal spray Place 2 sprays into the nose daily.    . nitroGLYCERIN (NITROSTAT) 0.4 MG SL tablet Place 0.4 mg under the tongue every 5 (five) minutes  as needed for chest pain.    Marland Kitchen tiZANidine (ZANAFLEX) 2 MG tablet TAKE 1 TABLET(2 MG) BY MOUTH AT BEDTIME AS NEEDED FOR MUSCLE SPASMS 30 tablet 1  . traMADol (ULTRAM) 50 MG tablet Take 1 tablet (50 mg total) by mouth 3 (three) times daily as needed. 40 tablet 0   No current facility-administered medications on file prior to visit.     Past Surgical History:  Procedure Laterality Date  . ABDOMINAL HYSTERECTOMY    . CHOLECYSTECTOMY    . EYE SURGERY  09/2014, 08/2014   Cataracts removal  . KNEE ARTHROSCOPY      Allergies  Allergen Reactions  . Tussionex Pennkinetic Er [Hydrocod Polst-Cpm Polst Er] Other (See Comments)    Upset stomach and loose stool     Social History   Social History  . Marital status: Married    Spouse name: Essick  . Number of children: 3  . Years of education: N/A   Occupational History  . Homemaker Retired    used to work for [employer]  . Smoking status: Former Smoker    Packs/day: 0.30    Years: 8.00    Types: Cigarettes  . Smokeless tobacco: Never Used  . Alcohol use 0.0 oz/week     Comment: Rarely---glass of wine with a meeting  . Drug use: No  . Sexual activity: Yes    Partners: Male     Comment: lives with husband, no dietary restrictions, minimizes dairy is walking more now   Other Topics Concern  . 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 Diabetes Mother   . Kidney disease Mother   . COPD Father        smoker  . Stroke Sister   . Breast cancer Sister        s/p mastectomy  . COPD Brother   . Stroke Unknown   . Diabetes Paternal Grandmother     BP (!) 164/91   Pulse 73   Ht 5\' 2"  (1.575 m)   Wt 178 lb (80.7 kg)   BMI 32.56 kg/m   Review of Systems: See HPI above.     Objective:   Physical Exam:  Gen: NAD, comfortable in exam room  Back: No gross deformity, scoliosis. TTP left lumbar paraspinal region.  No midline or other tenderness. FROM with pain on left side bend. Strength LEs 5/5 all muscle groups.   2+ MSRs in patellar and achilles tendons, equal bilaterally. Negative SLRs. Sensation intact to light touch bilaterally. Negative logroll bilateral hips Negative fabers and piriformis stretches.   Assessment &  Plan:  1. Low back pain - currently due to known degenerative changes and lumbar strain.  She is willing to try physical therapy.  Continue home exercises.  Aleve with tramadol and tizanidine as needed.  Heat or ice.  F/u in 5-6 weeks.  Patient also at end of visit asked about lateral ankle pain - has some tenderness over peroneals and pain with external rotation - reviewed home exercises, icing, stressed arch supports.

## 2017-03-31 NOTE — Assessment & Plan Note (Signed)
currently due to known degenerative changes and lumbar strain.  She is willing to try physical therapy.  Continue home exercises.  Aleve with tramadol and tizanidine as needed.  Heat or ice.  F/u in 5-6 weeks.

## 2017-04-04 DIAGNOSIS — M545 Low back pain: Secondary | ICD-10-CM | POA: Diagnosis not present

## 2017-04-04 DIAGNOSIS — M6281 Muscle weakness (generalized): Secondary | ICD-10-CM | POA: Diagnosis not present

## 2017-04-06 DIAGNOSIS — M545 Low back pain: Secondary | ICD-10-CM | POA: Diagnosis not present

## 2017-04-06 DIAGNOSIS — M6281 Muscle weakness (generalized): Secondary | ICD-10-CM | POA: Diagnosis not present

## 2017-04-13 DIAGNOSIS — M6281 Muscle weakness (generalized): Secondary | ICD-10-CM | POA: Diagnosis not present

## 2017-04-13 DIAGNOSIS — M545 Low back pain: Secondary | ICD-10-CM | POA: Diagnosis not present

## 2017-04-18 DIAGNOSIS — M6281 Muscle weakness (generalized): Secondary | ICD-10-CM | POA: Diagnosis not present

## 2017-04-18 DIAGNOSIS — M545 Low back pain: Secondary | ICD-10-CM | POA: Diagnosis not present

## 2017-04-21 DIAGNOSIS — M6281 Muscle weakness (generalized): Secondary | ICD-10-CM | POA: Diagnosis not present

## 2017-04-21 DIAGNOSIS — M545 Low back pain: Secondary | ICD-10-CM | POA: Diagnosis not present

## 2017-04-26 ENCOUNTER — Other Ambulatory Visit: Payer: Self-pay | Admitting: Family Medicine

## 2017-05-02 ENCOUNTER — Encounter: Payer: Self-pay | Admitting: Family Medicine

## 2017-05-02 ENCOUNTER — Ambulatory Visit (INDEPENDENT_AMBULATORY_CARE_PROVIDER_SITE_OTHER): Payer: Medicare Other | Admitting: Family Medicine

## 2017-05-02 DIAGNOSIS — G8929 Other chronic pain: Secondary | ICD-10-CM

## 2017-05-02 DIAGNOSIS — M545 Low back pain: Secondary | ICD-10-CM

## 2017-05-02 MED ORDER — NORTRIPTYLINE HCL 25 MG PO CAPS: 25.0000 mg | ORAL_CAPSULE | Freq: Every day | ORAL | 2 refills | Status: DC

## 2017-05-02 NOTE — Addendum Note (Signed)
Addended by: Dene Gentry on: 05/02/2017 02:38 PM   Modules accepted: Orders

## 2017-05-02 NOTE — Assessment & Plan Note (Signed)
2/2 known degenerative changes.  Lumbar strain improved.  Worsened with recent long car trip.  Some improvement with physical therapy but only mild.  She will finish this and continue with home exercises.  She would like to try nortriptyline.  Discussed considering ESIs, chiropractic care, acupuncture if not improving though latter two of mixed benefit.

## 2017-05-02 NOTE — Progress Notes (Addendum)
PCP and consultation requested by: Mosie Lukes, MD  Subjective:   HPI: Patient is a 71 y.o. female here for low back pain.  11/20: Patient report she started to get pain in low back on 10/28. Started when she was at a [employer] - no injury or trauma just started getting pain central low back. Pain is 0/10 at rest but can get up to 7/10 and stabbing. She is taking tizanidine on active days which helps some. Not taking any other medicines. Does stretching in morning and an army based exercise program (husband was in the [employer]). No radiation. No numbness or tingling with this. No bowel/bladder dysfunction.  12/07/16: Patient reports she continues to have soreness midline of mid-low back. Pain level 6/10 now. Pain with bending, prolonged sitting or standing. Hasn't been able to do home exercises as much as she had the flu twice. Has been resting, taking tizanidine and over the counter medications. Using a back belt which helps. Knees also bothering her. Has tried knee braces. Prior history of bilateral partial meniscectomies. Pain also 6/10 both areas, worse with prolonged walking and going from sitting to standing. No bowel/bladder dysfunction. No numbness or tingling. No radiation of back pain.  3/14: Patient returns with pain still 6/10. Had to do a lot of baking so was standing for 6 hours and had to take frequent breaks as pain in low back was bothersome. No radiation into legs. Worse with extending, prolonged walking and standing. No numbness or tingling.  No bowel/bladder dysfunction. Doing some home exercises especially in morning and when pain is worse.  6/13: Patient reports she's developed worsening left sided low back pain about 5 days ago. Pain level 4/10, up to 7/10 and sharp. No radiation past buttocks on left side. No numbness or tingling. No bowel/bladder dysfunction. Worse getting in and out of vehicles. Taking tizanidine, finished with  tramadol. Doing home exercises and stretches.  7/16: Patient reports she's had back pain continue and worsen after recent long car trip to [place]. Pain is 6/10 and a constant soreness in low back. No radiation into legs. No numbness or tingling. No bowel/bladder dysfunction. Worse with sitting to standing and with prolonged sitting. Feels physical therapy helped her. Using heating pad, elevating legs. Takes aleve and occasional tizanidine. Does not take tylenol because this causes diarrhea.  Past Medical History:  Diagnosis Date  . Allergic rhinitis   . Anemia 03/19/2013  . Anxiety   . Bouchard nodes (DJD hand)   . Carotid artery disease ([place]) 03/24/2014  . Cervical cancer screening 07/31/2015  . Dense breast tissue 03/03/2017  . Diabetes mellitus type II   . DJD (degenerative joint disease), lumbar   . Fibroids   . Glaucoma   . H/O diverticulitis of colon 03/03/2017  . History of DVT (deep vein thrombosis) 1994  . Hyperlipidemia   . Hypertension   . Insomnia 02/03/2016  . Nocturia 12/18/2012  . Obesity 07/31/2015  . Palpitations 12/18/2012    Current Outpatient Prescriptions on File Prior to Visit  Medication Sig Dispense Refill  . amLODipine (NORVASC) 10 MG tablet Take 1 tablet (10 mg total) by mouth daily. 90 tablet 1  . aspirin 81 MG tablet Take 81 mg by mouth daily.      Marland Kitchen atorvastatin (LIPITOR) 40 MG tablet TAKE 1 AND 1/2 TABLETS BY MOUTH DAILY 135 tablet 3  . Cholecalciferol (VITAMIN D) 1000 UNITS capsule Take 1,000 Units by mouth. ocassionally    . Cyanocobalamin (B-12) 1000  MCG CAPS Take 1 tablet by mouth daily.    . fluticasone (FLONASE) 50 MCG/ACT nasal spray Place 2 sprays into both nostrils daily as needed for allergies or rhinitis. 16 g 6  . glucose blood (GLUCOSE METER TEST) test strip To use with onetouch verio flex DX E11.9 100 each 6  . glucose blood (ONETOUCH VERIO) test strip Check blood sugar once daily.  DX E11.9 100 each 6  . losartan (COZAAR) 100 MG  tablet TAKE 1 TABLET BY MOUTH EVERY DAY 30 tablet 0  . metFORMIN (GLUCOPHAGE) 500 MG tablet Take 1 tablet (500 mg total) by mouth 2 (two) times daily with a meal. 180 tablet 3  . mometasone (NASONEX) 50 MCG/ACT nasal spray Place 2 sprays into the nose daily.    . nitroGLYCERIN (NITROSTAT) 0.4 MG SL tablet Place 0.4 mg under the tongue every 5 (five) minutes as needed for chest pain.    Marland Kitchen tiZANidine (ZANAFLEX) 2 MG tablet TAKE 1 TABLET(2 MG) BY MOUTH AT BEDTIME AS NEEDED FOR MUSCLE SPASMS 30 tablet 1   No current facility-administered medications on file prior to visit.     Past Surgical History:  Procedure Laterality Date  . ABDOMINAL HYSTERECTOMY    . CHOLECYSTECTOMY    . EYE SURGERY  09/2014, 08/2014   Cataracts removal  . KNEE ARTHROSCOPY      Allergies  Allergen Reactions  . Tussionex Pennkinetic Er [Hydrocod Polst-Cpm Polst Er] Other (See Comments)    Upset stomach and loose stool     Social History   Social History  . Marital status: Married    Spouse name: Essick  . Number of children: 3  . Years of education: N/A   Occupational History  . Homemaker Retired    used to work for [employer]  . Smoking status: Former Smoker    Packs/day: 0.30    Years: 8.00    Types: Cigarettes  . Smokeless tobacco: Never Used  . Alcohol use 0.0 oz/week     Comment: Rarely---glass of wine with a meeting  . Drug use: No  . Sexual activity: Yes    Partners: Male     Comment: lives with husband, no dietary restrictions, minimizes dairy is walking more now   Other Topics Concern  . 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 Diabetes Mother   . Kidney disease Mother   . COPD Father        smoker  . Stroke Sister   . Breast cancer Sister         s/p mastectomy  . COPD Brother   . Stroke Unknown   . Diabetes Paternal Grandmother     Pulse 69   Ht 5\' 2"  (1.575 m)   Wt 179 lb (81.2 kg)   BMI 32.74 kg/m   Review of Systems: See HPI above.     Objective:  Physical Exam:  Gen: NAD, comfortable in exam room  Back: No gross deformity, scoliosis. TTP minimally left lumbar paraspinal region.  No midline or other tenderness. FROM without pain currently. Strength LEs 5/5 all muscle groups.   2+ MSRs in patellar and  achilles tendons, equal bilaterally. Negative SLRs. Sensation intact to light touch bilaterally. Negative logroll bilateral hips   Assessment & Plan:  1. Low back pain - 2/2 known degenerative changes.  Lumbar strain improved.  Worsened with recent long car trip.  Some improvement with physical therapy but only mild.  She will finish this and continue with home exercises.  She would like to try nortriptyline.  Discussed considering ESIs, chiropractic care, acupuncture if not improving though latter two of mixed benefit.  Addendum:  Insurance denied nortriptyline.  Will try low dose gabapentin at night.

## 2017-05-02 NOTE — Patient Instructions (Signed)
Finish out the physical therapy and do home exercises still. Try nortriptyline at bedtime (if this helps you but not enough we could always go up on it in the future) for at least 2 weeks and give me a call to let me know how you're doing. Consider injections, chiropractic care, acupuncture if not improving. Follow up with me in 6 weeks if you're doing well.

## 2017-05-03 MED ORDER — GABAPENTIN 100 MG PO CAPS: 100.0000 mg | ORAL_CAPSULE | Freq: Every day | ORAL | 3 refills | Status: DC

## 2017-05-03 NOTE — Addendum Note (Signed)
Addended by: Dene Gentry on: 05/03/2017 02:46 PM   Modules accepted: Orders

## 2017-05-04 ENCOUNTER — Telehealth: Payer: Self-pay | Admitting: Family Medicine

## 2017-05-04 NOTE — Telephone Encounter (Signed)
Called the authorization number and the representative said it was for the nortriptyline not the gabapentin. Will contact the pharmacy to tell them that the gabapentin does not require a prior authorization.

## 2017-05-04 NOTE — Telephone Encounter (Signed)
May want to check on this - this may be left over from the nortriptyline request.  We automatically received the prior authorization form and email when I sent that in.  We did not receive that when I sent in the gabapentin and it said it was a 'preferred' medication.

## 2017-05-04 NOTE — Telephone Encounter (Signed)
Great, thanks

## 2017-05-04 NOTE — Telephone Encounter (Signed)
Patient states her medication requires pre authorization, she did not state the name of the medication.   Patient gave the phone number for authorization: [phone]

## 2017-05-20 ENCOUNTER — Telehealth: Payer: Self-pay | Admitting: Family Medicine

## 2017-05-20 NOTE — Telephone Encounter (Signed)
Called left message to call back 

## 2017-05-20 NOTE — Telephone Encounter (Signed)
Pt called in to speak with assistant. She said that she have a question.   Per assistant she will call pt back when she get a chance.   Advised pt, pt got upset and started fussing and hung up.

## 2017-05-21 ENCOUNTER — Encounter (HOSPITAL_BASED_OUTPATIENT_CLINIC_OR_DEPARTMENT_OTHER): Payer: Self-pay

## 2017-05-21 ENCOUNTER — Emergency Department (HOSPITAL_BASED_OUTPATIENT_CLINIC_OR_DEPARTMENT_OTHER)
Admission: EM | Admit: 2017-05-21 | Discharge: 2017-05-21 | Disposition: A | Discharge status: Home / Self Care | Payer: Medicare Other | Attending: Emergency Medicine | Admitting: Emergency Medicine

## 2017-05-21 ENCOUNTER — Emergency Department (HOSPITAL_BASED_OUTPATIENT_CLINIC_OR_DEPARTMENT_OTHER): Payer: Medicare Other

## 2017-05-21 DIAGNOSIS — Z79899 Other long term (current) drug therapy: Secondary | ICD-10-CM | POA: Insufficient documentation

## 2017-05-21 DIAGNOSIS — E119 Type 2 diabetes mellitus without complications: Secondary | ICD-10-CM | POA: Diagnosis not present

## 2017-05-21 DIAGNOSIS — Z7984 Long term (current) use of oral hypoglycemic drugs: Secondary | ICD-10-CM | POA: Insufficient documentation

## 2017-05-21 DIAGNOSIS — Z87891 Personal history of nicotine dependence: Secondary | ICD-10-CM | POA: Diagnosis not present

## 2017-05-21 DIAGNOSIS — Z7982 Long term (current) use of aspirin: Secondary | ICD-10-CM | POA: Diagnosis not present

## 2017-05-21 DIAGNOSIS — I2510 Atherosclerotic heart disease of native coronary artery without angina pectoris: Secondary | ICD-10-CM | POA: Diagnosis not present

## 2017-05-21 DIAGNOSIS — I10 Essential (primary) hypertension: Secondary | ICD-10-CM | POA: Insufficient documentation

## 2017-05-21 DIAGNOSIS — M25511 Pain in right shoulder: Secondary | ICD-10-CM | POA: Diagnosis not present

## 2017-05-21 NOTE — Discharge Instructions (Signed)
Continue using ice 3-4 times daily alternating 20 minutes on, 20 minutes off. You can take Aleve as prescribed over-the-counter and alternate with Tylenol as prescribed over-the-counter as well. Please follow-up with Dr. Barbaraann Barthel if your symptoms are continuing. Please return to the emergency department if you develop any new or worsening symptoms.

## 2017-05-21 NOTE — ED Provider Notes (Signed)
[facility] Provider Note   CSN: [id number] Arrival date & time: 05/21/17  8657     History   Chief Complaint Chief Complaint  Patient presents with  . Shoulder Pain    HPI Heather Roberts is a 72 y.o. female with history of hypertension, diabetes who presents with acute onset right shoulder pain. Patient reports she woke up and moved her arm and had a stabbing pain in her posterior right shoulder. Patient reports she initially had some tingling in her right arm, however this has resolved. Patient applied ice at home which did help her symptoms. She did not take any medications for it. Patient has not had problems with this shoulder before. She denies any known injury. She denies any fevers, chest pain, shortness of breath, abdominal pain, nausea, vomiting.  HPI  Past Medical History:  Diagnosis Date  . Allergic rhinitis   . Anemia 03/19/2013  . Anxiety   . Bouchard nodes (DJD hand)   . Carotid artery disease ([place]) 03/24/2014  . Cervical cancer screening 07/31/2015  . Dense breast tissue 03/03/2017  . Diabetes mellitus type II   . DJD (degenerative joint disease), lumbar   . Fibroids   . Glaucoma   . H/O diverticulitis of colon 03/03/2017  . History of DVT (deep vein thrombosis) 1994  . Hyperlipidemia   . Hypertension   . Insomnia 02/03/2016  . Nocturia 12/18/2012  . Obesity 07/31/2015  . Palpitations 12/18/2012    Patient Active Problem List   Diagnosis Date Noted  . Dense breast tissue 03/03/2017  . H/O diverticulitis of colon 03/03/2017  . Insomnia 02/03/2016  . Bilateral knee pain 01/01/2016  . Metatarsalgia of right foot 01/01/2016  . Cervical cancer screening 07/31/2015  . Obesity 07/31/2015  . Medicare annual wellness visit, subsequent 01/26/2015  . Sinusitis, acute maxillary 09/09/2014  . Carotid artery disease ([place]) 03/24/2014  . Anemia 03/19/2013  . Cerebrovascular disease 02/21/2013  . Chest pain 01/10/2013  . Palpitations 12/18/2012  .  Nocturia 12/18/2012  . Joint pain 09/23/2012  . Finger pain 01/21/2012  . Osteoporosis screening 10/29/2011  . Low back pain 07/16/2011  . Bursitis, trochanteric 07/11/2011  . Left knee pain 10/01/2010  . CATARACTS 09/07/2010  . ECZEMA 08/21/2010  . Pain in joint, lower leg 08/21/2010  . ANXIETY 08/11/2008  . Diabetes mellitus type 2 in obese ([place]) 08/09/2008  . MENOPAUSAL DISORDER 08/09/2008  . SHOULDER STRAIN, RIGHT 08/09/2008  . Allergic rhinitis 02/08/2008  . Hyperlipidemia associated with type 2 diabetes mellitus ([place]) 08/08/2007  . Essential hypertension 08/08/2007  . DVT, HX OF 08/08/2007  . Osteoarthrosis, hand 08/07/2007  . Osteoarthrosis, unspecified whether generalized or localized, involving lower leg 08/07/2007  . Osteoarthritis of spine 08/07/2007  . ARTHROSCOPY, KNEE, HX OF 08/07/2007    Past Surgical History:  Procedure Laterality Date  . ABDOMINAL HYSTERECTOMY    . CHOLECYSTECTOMY    . EYE SURGERY  09/2014, 08/2014   Cataracts removal  . KNEE ARTHROSCOPY      OB History    Gravida Para Term Preterm AB Living   3 3 3     3    SAB TAB Ectopic Multiple Live Births                   Home Medications    Prior to Admission medications   Medication Sig Start Date End Date Taking? Authorizing Provider  amLODipine (NORVASC) 10 MG tablet Take 1 tablet (10 mg total) by mouth daily.  05/03/16   Mosie Lukes, MD  aspirin 81 MG tablet Take 81 mg by mouth daily.      [provider]  atorvastatin (LIPITOR) 40 MG tablet TAKE 1 AND 1/2 TABLETS BY MOUTH DAILY 04/26/17   Mosie Lukes, MD  Cholecalciferol (VITAMIN D) 1000 UNITS capsule Take 1,000 Units by mouth. ocassionally    [provider]  Cyanocobalamin (B-12) 1000 MCG CAPS Take 1 tablet by mouth daily.    [provider]  fluticasone (FLONASE) 50 MCG/ACT nasal spray Place 2 sprays into both nostrils daily as needed for allergies or rhinitis. 12/17/13   Mosie Lukes, MD  gabapentin  (NEURONTIN) 100 MG capsule Take 1 capsule (100 mg total) by mouth at bedtime. 05/03/17   Dene Gentry, MD  glucose blood (GLUCOSE METER TEST) test strip To use with onetouch verio flex DX E11.9 07/31/15   [name] A, MD  glucose blood (ONETOUCH VERIO) test strip Check blood sugar once daily.  DX E11.9 07/31/15   Mosie Lukes, MD  losartan (COZAAR) 100 MG tablet TAKE 1 TABLET BY MOUTH EVERY DAY 05/20/14   Lelon Perla, MD  metFORMIN (GLUCOPHAGE) 500 MG tablet Take 1 tablet (500 mg total) by mouth 2 (two) times daily with a meal. 05/17/16   Mosie Lukes, MD  mometasone (NASONEX) 50 MCG/ACT nasal spray Place 2 sprays into the nose daily.    [provider]  nitroGLYCERIN (NITROSTAT) 0.4 MG SL tablet Place 0.4 mg under the tongue every 5 (five) minutes as needed for chest pain.    [provider]  tiZANidine (ZANAFLEX) 2 MG tablet TAKE 1 TABLET(2 MG) BY MOUTH AT BEDTIME AS NEEDED FOR MUSCLE SPASMS 03/03/17   Mosie Lukes, MD    Family History Family History  Problem Relation Age of Onset  . Diabetes Mother   . Kidney disease Mother   . COPD Father        smoker  . Stroke Sister   . Breast cancer Sister        s/p mastectomy  . COPD Brother   . Stroke Unknown   . Diabetes Paternal Grandmother     Social History Social History  Substance Use Topics  . Smoking status: Former Smoker    Packs/day: 0.30    Years: 8.00    Types: Cigarettes  . Smokeless tobacco: Never Used  . Alcohol use 0.0 oz/week     Comment: Rarely---glass of wine with a meeting     Allergies   Tussionex pennkinetic er [hydrocod polst-cpm polst er]   Review of Systems Review of Systems  Constitutional: Negative for chills and fever.  HENT: Negative for facial swelling and sore throat.   Respiratory: Negative for shortness of breath.   Cardiovascular: Negative for chest pain.  Gastrointestinal: Negative for abdominal pain, nausea and vomiting.  Musculoskeletal: Positive for  arthralgias (R shoulder). Negative for back pain.  Skin: Negative for rash and wound.  Psychiatric/Behavioral: The patient is not nervous/anxious.      Physical Exam Updated Vital Signs BP (!) 178/75 (BP Location: Left Arm)   Pulse 69   Temp 98.5 F (36.9 C) (Oral)   Resp 18   Ht 5\' 1"  (1.549 m)   Wt 79.8 kg (176 lb)   SpO2 98%   BMI 33.25 kg/m   Physical Exam  Constitutional: She appears well-developed and well-nourished. No distress.  HENT:  Head: Normocephalic and atraumatic.  Mouth/Throat: Oropharynx is clear and moist. No  oropharyngeal exudate.  Eyes: Pupils are equal, round, and reactive to light. Conjunctivae are normal. Right eye exhibits no discharge. Left eye exhibits no discharge. No scleral icterus.  Neck: Normal range of motion. Neck supple. No thyromegaly present.  Cardiovascular: Normal rate, regular rhythm, normal heart sounds and intact distal pulses.  Exam reveals no gallop and no friction rub.   No murmur heard. Pulmonary/Chest: Effort normal and breath sounds normal. No stridor. No respiratory distress. She has no wheezes. She has no rales.  Musculoskeletal: She exhibits no edema.  R shoulder: Anterior, lateral, and scapular tenderness; negative empty can test; 5/5 strength with flexion, extension, internal rotation, external rotation; normal sensation; equal bilateral grip strength; radial pulse intact  Lymphadenopathy:    She has no cervical adenopathy.  Neurological: She is alert. Coordination normal.  Skin: Skin is warm and dry. No rash noted. She is not diaphoretic. No pallor.  Psychiatric: She has a normal mood and affect.  Nursing note and vitals reviewed.    ED Treatments / Results  Labs (all labs ordered are listed, but only abnormal results are displayed) Labs Reviewed - No data to display  EKG  EKG Interpretation None       Radiology Dg Shoulder Right  Result Date: 05/21/2017 CLINICAL DATA:  Right shoulder pain.  Pain radiates to  the back. EXAM: RIGHT SHOULDER - 2+ VIEW COMPARISON:  Chest radiograph 02/26/2015 FINDINGS: Sclerosis and degenerative changes along the inferior right glenoid. Right shoulder is located without an acute fracture. Degenerative changes with spurring at the right [facility] joint. Mild irregularity along the inferior aspect of the acromion. Visualized right ribs are intact. IMPRESSION: Degenerative changes in the right shoulder.  No acute abnormality. Electronically Signed   By: Markus Daft M.D.   On: 05/21/2017 10:39    Procedures Procedures (including critical care time)  Medications Ordered in ED Medications - No data to display   Initial Impression / Assessment and Plan / ED Course  I have reviewed the triage vital signs and the nursing notes.  Pertinent labs & imaging results that were available during my care of the patient were reviewed by me and considered in my medical decision making (see chart for details).     Patient with right shoulder pain. X-ray shows degenerative changes, however no acute abnormality. I feel patient's pain is musculoskeletal related, may be related to sleeping position. Patient much better after ice and no other intervention in the ED. Patient denies any chest pain or shortness of breath. Doubt any other emergent cause such as ACS. Advised patient to use Aleve and Tylenol and continue ice. We also discussed shoulder range of motion exercises. Follow-up to sports medicine symptoms are not improving. Return precautions discussed. Patient understands and agrees with plan. Patient vitals stable throughout ED course and discharged in satisfactory condition. I discussed patient case with Dr. Tomi Bamberger who agrees with plan.   Final Clinical Impressions(s) / ED Diagnoses   Final diagnoses:  Acute pain of right shoulder    New Prescriptions New Prescriptions   No medications on file     Caryl Ada 05/21/17 1104    Dorie Rank, MD 05/22/17 [id number]

## 2017-05-21 NOTE — ED Triage Notes (Signed)
Pain in right shoulder. Radiates to back. Pain is present upon awakening. Denies injury.

## 2017-05-23 NOTE — Telephone Encounter (Signed)
Called left message to call back 

## 2017-05-24 NOTE — Telephone Encounter (Signed)
Have called this patient several times and unable to get her on the phone.

## 2017-06-06 ENCOUNTER — Ambulatory Visit (INDEPENDENT_AMBULATORY_CARE_PROVIDER_SITE_OTHER): Payer: Medicare Other | Admitting: Family Medicine

## 2017-06-06 DIAGNOSIS — K5900 Constipation, unspecified: Secondary | ICD-10-CM | POA: Diagnosis not present

## 2017-06-06 DIAGNOSIS — E1169 Type 2 diabetes mellitus with other specified complication: Secondary | ICD-10-CM | POA: Diagnosis not present

## 2017-06-06 DIAGNOSIS — E785 Hyperlipidemia, unspecified: Secondary | ICD-10-CM

## 2017-06-06 DIAGNOSIS — I10 Essential (primary) hypertension: Secondary | ICD-10-CM

## 2017-06-06 DIAGNOSIS — E6609 Other obesity due to excess calories: Secondary | ICD-10-CM | POA: Diagnosis not present

## 2017-06-06 DIAGNOSIS — E669 Obesity, unspecified: Secondary | ICD-10-CM | POA: Diagnosis not present

## 2017-06-06 LAB — CBC
HEMATOCRIT: 39.8 % (ref 36.0–46.0)
Hemoglobin: 13 g/dL (ref 12.0–15.0)
MCHC: 32.6 g/dL (ref 30.0–36.0)
MCV: 94.3 fl (ref 78.0–100.0)
Platelets: 248 10*3/uL (ref 150.0–400.0)
RBC: 4.22 Mil/uL (ref 3.87–5.11)
RDW: 14.3 % (ref 11.5–15.5)
WBC: 5.2 10*3/uL (ref 4.0–10.5)

## 2017-06-06 LAB — LIPID PANEL
CHOL/HDL RATIO: 2
CHOLESTEROL: 119 mg/dL (ref 0–200)
HDL: 57.2 mg/dL (ref >39.00)
LDL CALC: 44 mg/dL (ref 0–99)
NonHDL: 61.35
Triglycerides: 87 mg/dL (ref 0.0–149.0)
VLDL: 17.4 mg/dL (ref 0.0–40.0)

## 2017-06-06 LAB — COMPREHENSIVE METABOLIC PANEL
ALBUMIN: 3.8 g/dL (ref 3.5–5.2)
ALT: 13 U/L (ref 0–35)
AST: 19 U/L (ref 0–37)
Alkaline Phosphatase: 114 U/L (ref 39–117)
BUN: 13 mg/dL (ref 6–23)
CHLORIDE: 103 meq/L (ref 96–112)
CO2: 26 meq/L (ref 19–32)
Calcium: 9.6 mg/dL (ref 8.4–10.5)
Creatinine, Ser: 0.86 mg/dL (ref 0.40–1.20)
GFR: 83.66 mL/min (ref >60.00)
GLUCOSE: 153 mg/dL — AB (ref 70–99)
POTASSIUM: 3.8 meq/L (ref 3.5–5.1)
Sodium: 137 mEq/L (ref 135–145)
Total Bilirubin: 0.9 mg/dL (ref 0.2–1.2)
Total Protein: 7.8 g/dL (ref 6.0–8.3)

## 2017-06-06 LAB — HEMOGLOBIN A1C: Hgb A1c MFr Bld: 6.8 % — ABNORMAL HIGH (ref 4.6–6.5)

## 2017-06-06 LAB — TSH: TSH: 0.79 u[IU]/mL (ref 0.35–4.50)

## 2017-06-06 MED ORDER — HYDROCHLOROTHIAZIDE 12.5 MG PO TABS: 12.5000 mg | ORAL_TABLET | Freq: Every day | ORAL | 5 refills | Status: AC

## 2017-06-06 MED ORDER — METFORMIN HCL 500 MG PO TABS
500.0000 mg | ORAL_TABLET | Freq: Two times a day (BID) | ORAL | 3 refills | Status: DC
Start: 2017-06-06 — End: 2018-04-17

## 2017-06-06 NOTE — Assessment & Plan Note (Addendum)
Poorly controlled will alter medications, encouraged DASH diet, minimize caffeine and obtain adequate sleep. Report concerning symptoms and follow up as directed and as needed. Add HCTZ 12.5 mg daily

## 2017-06-06 NOTE — Assessment & Plan Note (Signed)
Encouraged increased hydration and fiber in diet. Daily probiotics. If bowels not moving can use MOM 2 tbls po in 4 oz of warm prune juice by mouth every 2-3 days. If no results then repeat in 4 hours with  Dulcolax suppository pr, may repeat again in 4 more hours as needed. Seek care if symptoms worsen. Consider daily Miralax and/or Dulcolax if symptoms persist. Try Miralax and Benefiber together daily

## 2017-06-06 NOTE — Assessment & Plan Note (Signed)
hgba1c acceptable, minimize simple carbs. Increase exercise as tolerated. Continue current meds 

## 2017-06-06 NOTE — Patient Instructions (Signed)
Encouraged increased hydration and fiber in diet. Daily probiotics. If bowels not moving can use MOM 2 tbls po in 4 oz of warm prune juice by mouth every 2-3 days. If no results then repeat in 4 hours with  Dulcolax suppository pr, may repeat again in 4 more hours as needed. Seek care if symptoms worsen. Consider daily Miralax and/or Dulcolax if symptoms persist. Try Miralax and Benefiber daily     Constipation, Adult Constipation is when a person:  Poops (has a bowel movement) fewer times in a week than normal.  Has a hard time pooping.  Has poop that is dry, hard, or bigger than normal.  Follow these instructions at home: Eating and drinking   Eat foods that have a lot of fiber, such as: ? Fresh fruits and vegetables. ? Whole grains. ? Beans.  Eat less of foods that are high in fat, low in fiber, or overly processed, such as: ? [place] fries. ? Hamburgers. ? Cookies. ? Candy. ? Soda.  Drink enough fluid to keep your pee (urine) clear or pale yellow. General instructions  Exercise regularly or as told by your doctor.  Go to the restroom when you feel like you need to poop. Do not hold it in.  Take over-the-counter and prescription medicines only as told by your doctor. These include any fiber supplements.  Do pelvic floor retraining exercises, such as: ? Doing deep breathing while relaxing your lower belly (abdomen). ? Relaxing your pelvic floor while pooping.  Watch your condition for any changes.  Keep all follow-up visits as told by your doctor. This is important. Contact a doctor if:  You have pain that gets worse.  You have a fever.  You have not pooped for 4 days.  You throw up (vomit).  You are not hungry.  You lose weight.  You are bleeding from the anus.  You have thin, pencil-like poop (stool). Get help right away if:  You have a fever, and your symptoms suddenly get worse.  You leak poop or have blood in your poop.  Your belly feels hard  or bigger than normal (is bloated).  You have very bad belly pain.  You feel dizzy or you faint. This information is not intended to replace advice given to you by your health care provider. Make sure you discuss any questions you have with your health care provider. Document Released: 03/22/2008 Document Revised: 04/23/2016 Document Reviewed: 03/24/2016 Elsevier Interactive Patient Education  2017 [employer].

## 2017-06-06 NOTE — Assessment & Plan Note (Signed)
Tolerating statin, encouraged heart healthy diet, avoid trans fats, minimize simple carbs and saturated fats. Increase exercise as tolerated 

## 2017-06-06 NOTE — Progress Notes (Signed)
Subjective:  I acted as a Education administrator for Dr. Charlett Blake. [place], [place]  Patient ID: Heather Roberts, female    DOB: 26-Oct-1945, 71 y.o.   MRN: [id number]  No chief complaint on file.   HPI  Patient is in today for a follow up. She is following up on her hypertension, DM, and other medical concerns. No polyuria or polydipsia. Is noting some increased fatigue and sob when she walks up stairs. Denies CP/palp/SOB/HA/congestion/fevers/GI or GU c/o. Taking meds as prescribed  Patient Care Team: Mosie Lukes, MD as PCP - General (Family Medicine) Stanford Breed Denice Bors, MD as Consulting Physician (Cardiology)   Past Medical History:  Diagnosis Date  . Allergic rhinitis   . Anemia 03/19/2013  . Anxiety   . Bouchard nodes (DJD hand)   . Carotid artery disease ([place]) 03/24/2014  . Cervical cancer screening 07/31/2015  . Dense breast tissue 03/03/2017  . Diabetes mellitus type II   . DJD (degenerative joint disease), lumbar   . Fibroids   . Glaucoma   . H/O diverticulitis of colon 03/03/2017  . History of DVT (deep vein thrombosis) 1994  . Hyperlipidemia   . Hypertension   . Insomnia 02/03/2016  . Nocturia 12/18/2012  . Obesity 07/31/2015  . Palpitations 12/18/2012    Past Surgical History:  Procedure Laterality Date  . ABDOMINAL HYSTERECTOMY    . CHOLECYSTECTOMY    . EYE SURGERY  09/2014, 08/2014   Cataracts removal  . KNEE ARTHROSCOPY      Family History  Problem Relation Age of Onset  . Diabetes Mother   . Kidney disease Mother   . COPD Father        smoker  . Stroke Sister   . Breast cancer Sister        s/p mastectomy  . COPD Brother   . Stroke Unknown   . Diabetes Paternal Grandmother     Social History   Social History  . Marital status: Married    Spouse name: Essick  . Number of children: 3  . Years of education: N/A   Occupational History  . Homemaker Retired    used to work for [employer]  . Smoking status: Former  Smoker    Packs/day: 0.30    Years: 8.00    Types: Cigarettes  . Smokeless tobacco: Never Used  . Alcohol use 0.0 oz/week     Comment: Rarely---glass of wine with a meeting  . Drug use: No  . Sexual activity: Yes    Partners: Male     Comment: lives with husband, no dietary restrictions, minimizes dairy is walking more now   Other Topics Concern  . 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 aspirin 81 MG tablet Take 81 mg by mouth daily.      Marland Kitchen atorvastatin (LIPITOR) 40 MG tablet TAKE 1 AND 1/2 TABLETS BY MOUTH DAILY 135 tablet 3  . Cholecalciferol (VITAMIN D) 1000 UNITS capsule Take 1,000 Units by mouth. ocassionally    . Cyanocobalamin (B-12) 1000 MCG CAPS Take 1 tablet by mouth daily.    . fluticasone (FLONASE) 50 MCG/ACT  nasal spray Place 2 sprays into both nostrils daily as needed for allergies or rhinitis. 16 g 6  . gabapentin (NEURONTIN) 100 MG capsule Take 1 capsule (100 mg total) by mouth at bedtime. 30 capsule 3  . glucose blood (GLUCOSE METER TEST) test strip To use with onetouch verio flex DX E11.9 100 each 6  . glucose blood (ONETOUCH VERIO) test strip Check blood sugar once daily.  DX E11.9 100 each 6  . losartan (COZAAR) 100 MG tablet TAKE 1 TABLET BY MOUTH EVERY DAY 30 tablet 0  . mometasone (NASONEX) 50 MCG/ACT nasal spray Place 2 sprays into the nose daily.    . nitroGLYCERIN (NITROSTAT) 0.4 MG SL tablet Place 0.4 mg under the tongue every 5 (five) minutes as needed for chest pain.    Marland Kitchen tiZANidine (ZANAFLEX) 2 MG tablet TAKE 1 TABLET(2 MG) BY MOUTH AT BEDTIME AS NEEDED FOR MUSCLE SPASMS 30 tablet 1  . amLODipine (NORVASC) 10 MG tablet Take 1 tablet (10 mg total) by mouth daily. 90 tablet 1  . metFORMIN (GLUCOPHAGE) 500 MG tablet Take 1 tablet (500  mg total) by mouth 2 (two) times daily with a meal. 180 tablet 3   No facility-administered medications prior to visit.     Allergies  Allergen Reactions  . Tussionex Pennkinetic Er [Hydrocod Polst-Cpm Polst Er] Other (See Comments)    Upset stomach and loose stool     Review of Systems  Constitutional: Negative for fever and malaise/fatigue.  HENT: Negative for congestion.   Eyes: Negative for blurred vision.  Respiratory: Positive for shortness of breath. Negative for cough.   Cardiovascular: Negative for chest pain, palpitations and leg swelling.  Gastrointestinal: Negative for vomiting.  Musculoskeletal: Negative for back pain.  Skin: Negative for rash.  Neurological: Negative for loss of consciousness and headaches.       Objective:    Physical Exam  Constitutional: She is oriented to person, place, and time. She appears well-developed and well-nourished. No distress.  HENT:  Head: Normocephalic and atraumatic.  Eyes: Conjunctivae are normal.  Neck: Normal range of motion. No thyromegaly present.  Cardiovascular: Normal rate and regular rhythm.   Pulmonary/Chest: Effort normal and breath sounds normal. She has no wheezes.  Abdominal: Soft. Bowel sounds are normal. There is no tenderness.  Musculoskeletal: Normal range of motion. She exhibits no edema or deformity.  Neurological: She is alert and oriented to person, place, and time.  Skin: Skin is warm and dry. She is not diaphoretic.  Psychiatric: She has a normal mood and affect.    BP (!) 172/80 (BP Location: Left Arm, Patient Position: Sitting, Cuff Size: Normal)   Pulse 69   Temp 98.1 F (36.7 C) (Oral)   Resp 18   Ht 5\' 1"  (1.549 m)   Wt 177 lb 3.2 oz (80.4 kg)   SpO2 98%   BMI 33.48 kg/m  Wt Readings from Last 3 Encounters:  06/06/17 177 lb 3.2 oz (80.4 kg)  05/21/17 176 lb (79.8 kg)  05/02/17 179 lb (81.2 kg)   BP Readings from Last 3 Encounters:  06/06/17 (!) 172/80  05/21/17 (!) 171/71    03/30/17 (!) 164/91     Immunization History  Administered Date(s) Administered  . Influenza Split 06/23/2012  . Influenza Whole 08/09/2008, 07/31/2009, 06/19/2010  . Influenza, High Dose Seasonal PF 07/08/2016  . Influenza,inj,Quad PF,6+ Mos 06/25/2013, 07/22/2014, 07/31/2015  . Pneumococcal Conjugate-13 02/03/2016  . Pneumococcal Polysaccharide-23 08/06/2011  . Td 12/16/2004, 09/02/2016  . Zoster 01/03/2007  Health Maintenance  Topic Date Due  . FOOT EXAM  12/01/2016  . INFLUENZA VACCINE  05/18/2017  . HEMOGLOBIN A1C  12/07/2017  . OPHTHALMOLOGY EXAM  02/11/2018  . MAMMOGRAM  12/17/2018  . Fecal DNA (Cologuard)  02/09/2019  . TETANUS/TDAP  09/02/2026  . DEXA SCAN  Completed  . Hepatitis C Screening  Completed  . PNA vac Low Risk Adult  Completed    Lab Results  Component Value Date   WBC 5.2 06/06/2017   HGB 13.0 06/06/2017   HCT 39.8 06/06/2017   PLT 248.0 06/06/2017   GLUCOSE 153 (H) 06/06/2017   CHOL 119 06/06/2017   TRIG 87.0 06/06/2017   HDL 57.20 06/06/2017   LDLDIRECT 118.5 10/04/2007   LDLCALC 44 06/06/2017   ALT 13 06/06/2017   AST 19 06/06/2017   NA 137 06/06/2017   K 3.8 06/06/2017   CL 103 06/06/2017   CREATININE 0.86 06/06/2017   BUN 13 06/06/2017   CO2 26 06/06/2017   TSH 0.79 06/06/2017   HGBA1C 6.8 (H) 06/06/2017   MICROALBUR 0.50 06/14/2012    Lab Results  Component Value Date   TSH 0.79 06/06/2017   Lab Results  Component Value Date   WBC 5.2 06/06/2017   HGB 13.0 06/06/2017   HCT 39.8 06/06/2017   MCV 94.3 06/06/2017   PLT 248.0 06/06/2017   Lab Results  Component Value Date   NA 137 06/06/2017   K 3.8 06/06/2017   CO2 26 06/06/2017   GLUCOSE 153 (H) 06/06/2017   BUN 13 06/06/2017   CREATININE 0.86 06/06/2017   BILITOT 0.9 06/06/2017   ALKPHOS 114 06/06/2017   AST 19 06/06/2017   ALT 13 06/06/2017   PROT 7.8 06/06/2017   ALBUMIN 3.8 06/06/2017   CALCIUM 9.6 06/06/2017   GFR 83.66 06/06/2017   Lab Results   Component Value Date   CHOL 119 06/06/2017   Lab Results  Component Value Date   HDL 57.20 06/06/2017   Lab Results  Component Value Date   LDLCALC 44 06/06/2017   Lab Results  Component Value Date   TRIG 87.0 06/06/2017   Lab Results  Component Value Date   CHOLHDL 2 06/06/2017   Lab Results  Component Value Date   HGBA1C 6.8 (H) 06/06/2017         Assessment & Plan:   Problem List Items Addressed This Visit    Diabetes mellitus type 2 in obese ([place])    hgba1c acceptable, minimize simple carbs. Increase exercise as tolerated. Continue current meds      Relevant Medications   metFORMIN (GLUCOPHAGE) 500 MG tablet   Other Relevant Orders   Hemoglobin A1c (Completed)   Hyperlipidemia associated with type 2 diabetes mellitus ([place])    Tolerating statin, encouraged heart healthy diet, avoid trans fats, minimize simple carbs and saturated fats. Increase exercise as tolerated      Relevant Medications   metFORMIN (GLUCOPHAGE) 500 MG tablet   hydrochlorothiazide (HYDRODIURIL) 12.5 MG tablet   amLODipine (NORVASC) 10 MG tablet   Other Relevant Orders   Lipid panel (Completed)   Essential hypertension    Poorly controlled will alter medications, encouraged DASH diet, minimize caffeine and obtain adequate sleep. Report concerning symptoms and follow up as directed and as needed. Add HCTZ 12.5 mg daily      Relevant Medications   hydrochlorothiazide (HYDRODIURIL) 12.5 MG tablet   amLODipine (NORVASC) 10 MG tablet   Other Relevant Orders   CBC (Completed)   Comprehensive metabolic  panel (Completed)   TSH (Completed)   Constipation    Encouraged increased hydration and fiber in diet. Daily probiotics. If bowels not moving can use MOM 2 tbls po in 4 oz of warm prune juice by mouth every 2-3 days. If no results then repeat in 4 hours with  Dulcolax suppository pr, may repeat again in 4 more hours as needed. Seek care if symptoms worsen. Consider daily Miralax and/or  Dulcolax if symptoms persist. Try Miralax and Benefiber together daily      Obesity    Encouraged DASH diet, decrease po intake and increase exercise as tolerated. Needs 7-8 hours of sleep nightly. Avoid trans fats, eat small, frequent meals every 4-5 hours with lean proteins, complex carbs and healthy fats. Minimize simple carbs      Relevant Medications   metFORMIN (GLUCOPHAGE) 500 MG tablet      I am having Ms. Garfield Cornea start on hydrochlorothiazide. I am also having her maintain her aspirin, Vitamin D, fluticasone, losartan, B-12, glucose blood, glucose blood, nitroGLYCERIN, mometasone, tiZANidine, atorvastatin, gabapentin, metFORMIN, and amLODipine.  Meds ordered this encounter  Medications  . metFORMIN (GLUCOPHAGE) 500 MG tablet    Sig: Take 1 tablet (500 mg total) by mouth 2 (two) times daily with a meal.    Dispense:  180 tablet    Refill:  3  . hydrochlorothiazide (HYDRODIURIL) 12.5 MG tablet    Sig: Take 1 tablet (12.5 mg total) by mouth daily.    Dispense:  30 tablet    Refill:  5  . amLODipine (NORVASC) 10 MG tablet    Sig: Take 1 tablet (10 mg total) by mouth daily.    Dispense:  90 tablet    Refill:  1    CMA served as Education administrator during this visit. History, Physical and Plan performed by medical provider. Documentation and orders reviewed and attested to.  [name], MD

## 2017-06-07 MED ORDER — AMLODIPINE BESYLATE 10 MG PO TABS: 10.0000 mg | ORAL_TABLET | Freq: Every day | ORAL | 1 refills | Status: DC

## 2017-06-12 NOTE — Assessment & Plan Note (Signed)
Encouraged DASH diet, decrease po intake and increase exercise as tolerated. Needs 7-8 hours of sleep nightly. Avoid trans fats, eat small, frequent meals every 4-5 hours with lean proteins, complex carbs and healthy fats. Minimize simple carbs 

## 2017-06-13 ENCOUNTER — Ambulatory Visit (INDEPENDENT_AMBULATORY_CARE_PROVIDER_SITE_OTHER): Payer: Medicare Other | Admitting: Family Medicine

## 2017-06-13 ENCOUNTER — Encounter: Payer: Self-pay | Admitting: Family Medicine

## 2017-06-13 DIAGNOSIS — M25561 Pain in right knee: Secondary | ICD-10-CM | POA: Diagnosis not present

## 2017-06-13 DIAGNOSIS — G8929 Other chronic pain: Secondary | ICD-10-CM | POA: Diagnosis not present

## 2017-06-13 NOTE — Patient Instructions (Signed)
You flared up the arthritis in your knee causing some swelling here. This does not need to be drained unless I was concerned you had gout or an infection (I'm not concerned about either of these) - your body should take care of the fluid naturally. Keep this wrapped or use sleeve for compression when up and walking around. These are the different medications you can take for this: Tylenol 500mg  1-2 tabs three times a day for pain. Capsaicin, aspercreme, or biofreeze topically up to four times a day may also help with pain. Some supplements that may help for arthritis: Boswellia extract, curcumin, pycnogenol Aleve 1-2 tabs twice a day with food OR ibuprofen 600mg  three times a day with food. Cortisone injections are an option - let me know if you want to do this. If cortisone injections do not help, there are different types of shots that may help but they take longer to take effect. It's important that you continue to stay active. Straight leg raises, knee extensions 3 sets of 10 once a day (add ankle weight if these become too easy). Consider physical therapy to strengthen muscles around the joint that hurts to take pressure off of the joint itself. Shoe inserts with good arch support may be helpful. Heat or ice 15 minutes at a time 3-4 times a day as needed to help with pain. Water aerobics and cycling with low resistance are the best two types of exercise for arthritis though any exercise is ok as long as it doesn't worsen the pain. Follow up with me as needed.  We will look into the medication for your back but it sounds like if they won't let you have the gabapentin there isn't another medicine that would be covered. Consider steroid injection in your back (epidural), acupuncture, chiropractic care.

## 2017-06-14 NOTE — Progress Notes (Signed)
PCP: Mosie Lukes, MD  Subjective:   HPI: Patient is a 71 y.o. female here for right knee pain.  Patient denies known injury or trauma. She reports she was getting in and out of the passenger side of a truck several times yesterday. With this she developed pain anterior right knee. Pain was achy, up to 8/10 and had swelling, difficulty bending the knee. Using ACE wrap, taking tizanidine and advil. Has history of arthroscopic partial meniscectomy of this knee. No skin changes, numbness.  Past Medical History:  Diagnosis Date  . Allergic rhinitis   . Anemia 03/19/2013  . Anxiety   . Bouchard nodes (DJD hand)   . Carotid artery disease ([place]) 03/24/2014  . Cervical cancer screening 07/31/2015  . Dense breast tissue 03/03/2017  . Diabetes mellitus type II   . DJD (degenerative joint disease), lumbar   . Fibroids   . Glaucoma   . H/O diverticulitis of colon 03/03/2017  . History of DVT (deep vein thrombosis) 1994  . Hyperlipidemia   . Hypertension   . Insomnia 02/03/2016  . Nocturia 12/18/2012  . Obesity 07/31/2015  . Palpitations 12/18/2012    Current Outpatient Prescriptions on File Prior to Visit  Medication Sig Dispense Refill  . amLODipine (NORVASC) 10 MG tablet Take 1 tablet (10 mg total) by mouth daily. 90 tablet 1  . aspirin 81 MG tablet Take 81 mg by mouth daily.      Marland Kitchen atorvastatin (LIPITOR) 40 MG tablet TAKE 1 AND 1/2 TABLETS BY MOUTH DAILY 135 tablet 3  . Cholecalciferol (VITAMIN D) 1000 UNITS capsule Take 1,000 Units by mouth. ocassionally    . Cyanocobalamin (B-12) 1000 MCG CAPS Take 1 tablet by mouth daily.    . fluticasone (FLONASE) 50 MCG/ACT nasal spray Place 2 sprays into both nostrils daily as needed for allergies or rhinitis. 16 g 6  . gabapentin (NEURONTIN) 100 MG capsule Take 1 capsule (100 mg total) by mouth at bedtime. 30 capsule 3  . glucose blood (GLUCOSE METER TEST) test strip To use with onetouch verio flex DX E11.9 100 each 6  . glucose blood (ONETOUCH  VERIO) test strip Check blood sugar once daily.  DX E11.9 100 each 6  . hydrochlorothiazide (HYDRODIURIL) 12.5 MG tablet Take 1 tablet (12.5 mg total) by mouth daily. 30 tablet 5  . losartan (COZAAR) 100 MG tablet TAKE 1 TABLET BY MOUTH EVERY DAY 30 tablet 0  . metFORMIN (GLUCOPHAGE) 500 MG tablet Take 1 tablet (500 mg total) by mouth 2 (two) times daily with a meal. 180 tablet 3  . mometasone (NASONEX) 50 MCG/ACT nasal spray Place 2 sprays into the nose daily.    . nitroGLYCERIN (NITROSTAT) 0.4 MG SL tablet Place 0.4 mg under the tongue every 5 (five) minutes as needed for chest pain.    Marland Kitchen tiZANidine (ZANAFLEX) 2 MG tablet TAKE 1 TABLET(2 MG) BY MOUTH AT BEDTIME AS NEEDED FOR MUSCLE SPASMS 30 tablet 1   No current facility-administered medications on file prior to visit.     Past Surgical History:  Procedure Laterality Date  . ABDOMINAL HYSTERECTOMY    . CHOLECYSTECTOMY    . EYE SURGERY  09/2014, 08/2014   Cataracts removal  . KNEE ARTHROSCOPY      Allergies  Allergen Reactions  . Tussionex Pennkinetic Er [Hydrocod Polst-Cpm Polst Er] Other (See Comments)    Upset stomach and loose stool     Social History   Social History  . Marital status: Married  Spouse name: Essick  . Number of children: 3  . Years of education: N/A   Occupational History  . Homemaker Retired    used to work for [employer]  . Smoking status: Former Smoker    Packs/day: 0.30    Years: 8.00    Types: Cigarettes  . Smokeless tobacco: Never Used  . Alcohol use 0.0 oz/week     Comment: Rarely---glass of wine with a meeting  . Drug use: No  . Sexual activity: Yes    Partners: Male     Comment: lives with husband, no dietary restrictions, minimizes dairy is walking more now   Other Topics Concern  . 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 Diabetes Mother   . Kidney disease Mother   . COPD Father        smoker  . Stroke Sister   . Breast cancer Sister        s/p mastectomy  . COPD Brother   . Stroke Unknown   . Diabetes Paternal Grandmother     BP (!) 147/84   Pulse 78   Ht 5\' 2"  (1.575 m)   Wt 176 lb (79.8 kg)   BMI 32.19 kg/m   Review of Systems: See HPI above.     Objective:  Physical Exam:  Gen: NAD, comfortable in exam room  Right knee: No gross deformity, ecchymoses.  Mild effusion. Mild TTP medial joint line, post patellar facet.  No other tenderness. ROM 0 - 100 degrees. Negative ant/post drawers. Negative valgus/varus testing. Negative lachmanns. Negative mcmurrays, apleys, patellar apprehension. NV intact distally.  Left knee: FROM without pain.   Assessment & Plan:  1. Right knee pain - exam reassuring.  Consistent with flare of arthritis.  Compression, elevation.  Discussed tylenol, topical medications, supplements, aleve.  Consider cortisone injection if not improving.  Shown home exercises to do daily.  F/u prn.

## 2017-06-14 NOTE — Assessment & Plan Note (Signed)
exam reassuring.  Consistent with flare of arthritis.  Compression, elevation.  Discussed tylenol, topical medications, supplements, aleve.  Consider cortisone injection if not improving.  Shown home exercises to do daily.  F/u prn.

## 2017-07-07 ENCOUNTER — Other Ambulatory Visit: Payer: Medicare Other

## 2017-07-08 ENCOUNTER — Other Ambulatory Visit (INDEPENDENT_AMBULATORY_CARE_PROVIDER_SITE_OTHER): Payer: Medicare Other

## 2017-07-08 DIAGNOSIS — E785 Hyperlipidemia, unspecified: Secondary | ICD-10-CM

## 2017-07-08 DIAGNOSIS — E1169 Type 2 diabetes mellitus with other specified complication: Secondary | ICD-10-CM

## 2017-07-08 DIAGNOSIS — I10 Essential (primary) hypertension: Secondary | ICD-10-CM | POA: Diagnosis not present

## 2017-07-08 DIAGNOSIS — E669 Obesity, unspecified: Secondary | ICD-10-CM | POA: Diagnosis not present

## 2017-07-08 LAB — COMPREHENSIVE METABOLIC PANEL
ALBUMIN: 4.1 g/dL (ref 3.5–5.2)
ALT: 16 U/L (ref 0–35)
AST: 21 U/L (ref 0–37)
Alkaline Phosphatase: 97 U/L (ref 39–117)
BUN: 17 mg/dL (ref 6–23)
CALCIUM: 9.9 mg/dL (ref 8.4–10.5)
CHLORIDE: 99 meq/L (ref 96–112)
CO2: 29 meq/L (ref 19–32)
CREATININE: 1.07 mg/dL (ref 0.40–1.20)
GFR: 65 mL/min (ref >60.00)
Glucose, Bld: 149 mg/dL — ABNORMAL HIGH (ref 70–99)
POTASSIUM: 4.5 meq/L (ref 3.5–5.1)
SODIUM: 136 meq/L (ref 135–145)
Total Bilirubin: 0.8 mg/dL (ref 0.2–1.2)
Total Protein: 7.4 g/dL (ref 6.0–8.3)

## 2017-07-08 LAB — CBC
HEMATOCRIT: 37.6 % (ref 36.0–46.0)
Hemoglobin: 12.1 g/dL (ref 12.0–15.0)
MCHC: 32.3 g/dL (ref 30.0–36.0)
MCV: 95.6 fl (ref 78.0–100.0)
Platelets: 255 10*3/uL (ref 150.0–400.0)
RBC: 3.93 Mil/uL (ref 3.87–5.11)
RDW: 14.5 % (ref 11.5–15.5)
WBC: 6.5 10*3/uL (ref 4.0–10.5)

## 2017-07-08 LAB — LIPID PANEL
CHOL/HDL RATIO: 2
CHOLESTEROL: 131 mg/dL (ref 0–200)
HDL: 58.3 mg/dL (ref >39.00)
LDL CALC: 48 mg/dL (ref 0–99)
NonHDL: 72.93
TRIGLYCERIDES: 125 mg/dL (ref 0.0–149.0)
VLDL: 25 mg/dL (ref 0.0–40.0)

## 2017-07-08 LAB — HEMOGLOBIN A1C: Hgb A1c MFr Bld: 7 % — ABNORMAL HIGH (ref 4.6–6.5)

## 2017-07-08 LAB — TSH: TSH: 1.64 u[IU]/mL (ref 0.35–4.50)

## 2017-07-12 ENCOUNTER — Telehealth: Payer: Self-pay | Admitting: Family Medicine

## 2017-07-12 NOTE — Telephone Encounter (Signed)
Pt called for lab results from 07/08/17. Please call pt.

## 2017-07-14 NOTE — Telephone Encounter (Signed)
Spoke with patient notified of results.  pc   And mailed letter

## 2017-07-21 ENCOUNTER — Emergency Department (HOSPITAL_BASED_OUTPATIENT_CLINIC_OR_DEPARTMENT_OTHER): Payer: Medicare Other

## 2017-07-21 ENCOUNTER — Emergency Department (HOSPITAL_BASED_OUTPATIENT_CLINIC_OR_DEPARTMENT_OTHER)
Admission: EM | Admit: 2017-07-21 | Discharge: 2017-07-21 | Disposition: A | Discharge status: Home / Self Care | Payer: Medicare Other | Attending: Physician Assistant | Admitting: Physician Assistant

## 2017-07-21 ENCOUNTER — Encounter (HOSPITAL_BASED_OUTPATIENT_CLINIC_OR_DEPARTMENT_OTHER): Payer: Self-pay | Admitting: *Deleted

## 2017-07-21 DIAGNOSIS — R102 Pelvic and perineal pain: Secondary | ICD-10-CM | POA: Diagnosis not present

## 2017-07-21 DIAGNOSIS — Y9301 Activity, walking, marching and hiking: Secondary | ICD-10-CM | POA: Insufficient documentation

## 2017-07-21 DIAGNOSIS — S300XXA Contusion of lower back and pelvis, initial encounter: Secondary | ICD-10-CM | POA: Diagnosis not present

## 2017-07-21 DIAGNOSIS — S3993XA Unspecified injury of pelvis, initial encounter: Secondary | ICD-10-CM | POA: Diagnosis not present

## 2017-07-21 DIAGNOSIS — W01198A Fall on same level from slipping, tripping and stumbling with subsequent striking against other object, initial encounter: Secondary | ICD-10-CM | POA: Diagnosis not present

## 2017-07-21 DIAGNOSIS — I10 Essential (primary) hypertension: Secondary | ICD-10-CM | POA: Diagnosis not present

## 2017-07-21 DIAGNOSIS — Z7984 Long term (current) use of oral hypoglycemic drugs: Secondary | ICD-10-CM | POA: Insufficient documentation

## 2017-07-21 DIAGNOSIS — Y998 Other external cause status: Secondary | ICD-10-CM | POA: Insufficient documentation

## 2017-07-21 DIAGNOSIS — Y92015 Private garage of single-family (private) house as the place of occurrence of the external cause: Secondary | ICD-10-CM | POA: Insufficient documentation

## 2017-07-21 DIAGNOSIS — Z7982 Long term (current) use of aspirin: Secondary | ICD-10-CM | POA: Insufficient documentation

## 2017-07-21 DIAGNOSIS — I2510 Atherosclerotic heart disease of native coronary artery without angina pectoris: Secondary | ICD-10-CM | POA: Insufficient documentation

## 2017-07-21 DIAGNOSIS — R52 Pain, unspecified: Secondary | ICD-10-CM | POA: Insufficient documentation

## 2017-07-21 DIAGNOSIS — F17210 Nicotine dependence, cigarettes, uncomplicated: Secondary | ICD-10-CM | POA: Insufficient documentation

## 2017-07-21 DIAGNOSIS — W19XXXA Unspecified fall, initial encounter: Secondary | ICD-10-CM

## 2017-07-21 DIAGNOSIS — Z8673 Personal history of transient ischemic attack (TIA), and cerebral infarction without residual deficits: Secondary | ICD-10-CM | POA: Diagnosis not present

## 2017-07-21 DIAGNOSIS — E119 Type 2 diabetes mellitus without complications: Secondary | ICD-10-CM | POA: Insufficient documentation

## 2017-07-21 DIAGNOSIS — Z79899 Other long term (current) drug therapy: Secondary | ICD-10-CM | POA: Insufficient documentation

## 2017-07-21 DIAGNOSIS — M25552 Pain in left hip: Secondary | ICD-10-CM | POA: Diagnosis not present

## 2017-07-21 MED ORDER — IBUPROFEN 400 MG PO TABS
600.0000 mg | ORAL_TABLET | Freq: Once | ORAL | Status: AC
  Administered 2017-07-21: 16:00:00 600 mg via ORAL
  Filled 2017-07-21: qty 1

## 2017-07-21 NOTE — ED Provider Notes (Signed)
[place] [facility] Provider Note   CSN: [id number] Arrival date & time: 07/21/17  1318     History   Chief Complaint Chief Complaint  Patient presents with  . Fall    HPI Heather Roberts is a 71 y.o. female.  HPI   71 year old female presenting with mechanical fall yesterday. Patient slipped on water in her carport and fell against a metal pole. She felt fine yesterday took a hot bath. This morning woke up and felt herself kind of stiff with some pain in her left butt. Patient's family member looked at it and saw small bruise brought her here to the hospital. Patient's been ambulate fine and good range of motion.  Past Medical History:  Diagnosis Date  . Allergic rhinitis   . Anemia 03/19/2013  . Anxiety   . Bouchard nodes (DJD hand)   . Carotid artery disease ([place]) 03/24/2014  . Cervical cancer screening 07/31/2015  . Dense breast tissue 03/03/2017  . Diabetes mellitus type II   . DJD (degenerative joint disease), lumbar   . Fibroids   . Glaucoma   . H/O diverticulitis of colon 03/03/2017  . History of DVT (deep vein thrombosis) 1994  . Hyperlipidemia   . Hypertension   . Insomnia 02/03/2016  . Nocturia 12/18/2012  . Obesity 07/31/2015  . Palpitations 12/18/2012    Patient Active Problem List   Diagnosis Date Noted  . Dense breast tissue 03/03/2017  . H/O diverticulitis of colon 03/03/2017  . Insomnia 02/03/2016  . Right knee pain 01/01/2016  . Metatarsalgia of right foot 01/01/2016  . Cervical cancer screening 07/31/2015  . Obesity 07/31/2015  . Medicare annual wellness visit, subsequent 01/26/2015  . Sinusitis, acute maxillary 09/09/2014  . Carotid artery disease ([place]) 03/24/2014  . Anemia 03/19/2013  . Cerebrovascular disease 02/21/2013  . Chest pain 01/10/2013  . Palpitations 12/18/2012  . Nocturia 12/18/2012  . Joint pain 09/23/2012  . Finger pain 01/21/2012  . Osteoporosis screening 10/29/2011  . Low back pain 07/16/2011  . Bursitis,  trochanteric 07/11/2011  . Left knee pain 10/01/2010  . CATARACTS 09/07/2010  . ECZEMA 08/21/2010  . Pain in joint, lower leg 08/21/2010  . Constipation 09/08/2009  . ANXIETY 08/11/2008  . Diabetes mellitus type 2 in obese ([place]) 08/09/2008  . MENOPAUSAL DISORDER 08/09/2008  . SHOULDER STRAIN, RIGHT 08/09/2008  . Allergic rhinitis 02/08/2008  . Hyperlipidemia associated with type 2 diabetes mellitus ([place]) 08/08/2007  . Essential hypertension 08/08/2007  . DVT, HX OF 08/08/2007  . Osteoarthrosis, hand 08/07/2007  . Osteoarthrosis, unspecified whether generalized or localized, involving lower leg 08/07/2007  . Osteoarthritis of spine 08/07/2007  . ARTHROSCOPY, KNEE, HX OF 08/07/2007    Past Surgical History:  Procedure Laterality Date  . ABDOMINAL HYSTERECTOMY    . CHOLECYSTECTOMY    . EYE SURGERY  09/2014, 08/2014   Cataracts removal  . KNEE ARTHROSCOPY      OB History    Gravida Para Term Preterm AB Living   3 3 3     3    SAB TAB Ectopic Multiple Live Births                   Home Medications    Prior to Admission medications   Medication Sig Start Date End Date Taking? Authorizing Provider  amLODipine (NORVASC) 10 MG tablet Take 1 tablet (10 mg total) by mouth daily. 06/07/17   Mosie Lukes, MD  aspirin 81 MG tablet Take 81 mg by mouth  daily.      [provider]  atorvastatin (LIPITOR) 40 MG tablet TAKE 1 AND 1/2 TABLETS BY MOUTH DAILY 04/26/17   Mosie Lukes, MD  Cholecalciferol (VITAMIN D) 1000 UNITS capsule Take 1,000 Units by mouth. ocassionally    [provider]  Cyanocobalamin (B-12) 1000 MCG CAPS Take 1 tablet by mouth daily.    [provider]  fluticasone (FLONASE) 50 MCG/ACT nasal spray Place 2 sprays into both nostrils daily as needed for allergies or rhinitis. 12/17/13   Mosie Lukes, MD  gabapentin (NEURONTIN) 100 MG capsule Take 1 capsule (100 mg total) by mouth at bedtime. 05/03/17   Dene Gentry, MD  glucose blood  (GLUCOSE METER TEST) test strip To use with onetouch verio flex DX E11.9 07/31/15   [name] A, MD  glucose blood (ONETOUCH VERIO) test strip Check blood sugar once daily.  DX E11.9 07/31/15   Mosie Lukes, MD  hydrochlorothiazide (HYDRODIURIL) 12.5 MG tablet Take 1 tablet (12.5 mg total) by mouth daily. 06/06/17   Mosie Lukes, MD  losartan (COZAAR) 100 MG tablet TAKE 1 TABLET BY MOUTH EVERY DAY 05/20/14   Lelon Perla, MD  metFORMIN (GLUCOPHAGE) 500 MG tablet Take 1 tablet (500 mg total) by mouth 2 (two) times daily with a meal. 06/06/17   Mosie Lukes, MD  mometasone (NASONEX) 50 MCG/ACT nasal spray Place 2 sprays into the nose daily.    [provider]  nitroGLYCERIN (NITROSTAT) 0.4 MG SL tablet Place 0.4 mg under the tongue every 5 (five) minutes as needed for chest pain.    [provider]  tiZANidine (ZANAFLEX) 2 MG tablet TAKE 1 TABLET(2 MG) BY MOUTH AT BEDTIME AS NEEDED FOR MUSCLE SPASMS 03/03/17   Mosie Lukes, MD    Family History Family History  Problem Relation Age of Onset  . Diabetes Mother   . Kidney disease Mother   . COPD Father        smoker  . Stroke Sister   . Breast cancer Sister        s/p mastectomy  . COPD Brother   . Stroke Unknown   . Diabetes Paternal Grandmother     Social History Social History  Substance Use Topics  . Smoking status: Former Smoker    Packs/day: 0.30    Years: 8.00    Types: Cigarettes  . Smokeless tobacco: Never Used  . Alcohol use 0.0 oz/week     Comment: Rarely---glass of wine with a meeting     Allergies   Tussionex pennkinetic er [hydrocod polst-cpm polst er]   Review of Systems Review of Systems  Constitutional: Negative for activity change.  Respiratory: Negative for shortness of breath.   Cardiovascular: Negative for chest pain.  Gastrointestinal: Negative for abdominal pain.     Physical Exam Updated Vital Signs BP (!) 139/54   Pulse 71   Temp 98.4 F (36.9 C) (Oral)    Resp 12   Ht 5\' 2"  (1.575 m)   Wt 78.9 kg (174 lb)   SpO2 96%   BMI 31.83 kg/m   Physical Exam  Constitutional: She is oriented to person, place, and time. She appears well-developed and well-nourished.  HENT:  Head: Normocephalic and atraumatic.  Eyes: Right eye exhibits no discharge.  Cardiovascular: Normal rate, regular rhythm and normal heart sounds.   No murmur heard. Pulmonary/Chest: Effort normal and breath sounds normal.  Musculoskeletal:  Normal range of motion at hip of left leg.  Small less than 2 cm bruise in the center of the left buttocks. No pain on bony hip/pelvis.  Neurological: She is oriented to person, place, and time.  Skin: Skin is warm and dry. She is not diaphoretic.  Psychiatric: She has a normal mood and affect.  Nursing note and vitals reviewed.    ED Treatments / Results  Labs (all labs ordered are listed, but only abnormal results are displayed) Labs Reviewed - No data to display  EKG  EKG Interpretation None       Radiology No results found.  Procedures Procedures (including critical care time)  Medications Ordered in ED Medications  ibuprofen (ADVIL,MOTRIN) tablet 600 mg (not administered)     Initial Impression / Assessment and Plan / ED Course  I have reviewed the triage vital signs and the nursing notes.  Pertinent labs & imaging results that were available during my care of the patient were reviewed by me and considered in my medical decision making (see chart for details).      71 year old female presenting with mechanical fall yesterday. Patient slipped on water in her carport and fell against a metal pole. She felt fine yesterday took a hot bath. This morning woke up and felt herself kind of stiff with some pain in her left butt. Patient's family member looked at it and saw small bruise brought her here to the hospital. Patient's been ambulate fine and good range of motion.  4:08 PM We'll get x-rays x-ray and then plan on  encouraging patient to use heat and cold.   Final Clinical Impressions(s) / ED Diagnoses   Final diagnoses:  None    New Prescriptions New Prescriptions   No medications on file     Macarthur Critchley, MD 07/21/17 1609

## 2017-07-21 NOTE — Discharge Instructions (Signed)
Use ice heat Tylenol and ibuprofen to help with her symptoms.

## 2017-07-21 NOTE — ED Notes (Signed)
Pt reports that yesterday as she was stepping out of her truck she lost her balance and fell against a pole. Pt struck her L buttocks. Pt denies falling on the ground or hitting her head. Pt went to bed and woke up sore and stiff to her L buttock and hip this AM.

## 2017-07-21 NOTE — ED Triage Notes (Signed)
She slipped and fell yesterday evening. Woke with stiffness in her left buttocks and left side of her body.

## 2017-07-27 ENCOUNTER — Ambulatory Visit (INDEPENDENT_AMBULATORY_CARE_PROVIDER_SITE_OTHER): Payer: Medicare Other

## 2017-07-27 DIAGNOSIS — Z23 Encounter for immunization: Secondary | ICD-10-CM | POA: Diagnosis not present

## 2017-08-26 NOTE — Progress Notes (Signed)
Subjective:   Heather Roberts is a 71 y.o. female who presents for Medicare Annual (Subsequent) preventive examination.  Review of Systems:  No ROS.  Medicare Wellness Visit. Additional risk factors are reflected in the social history.  Cardiac Risk Factors include: advanced age (>89men, >65 women);diabetes mellitus;dyslipidemia;hypertension Sleep patterns: Sleeps 6.5 hrs per night.  Female:   Mammo- last 3/8/1/8      Dexa scan-   ORDERED TODAY   CCS- cologuard negative 02/09/16 Objective:     Vitals: BP 140/62 (BP Location: Left Arm, Patient Position: Sitting, Cuff Size: Normal)   Pulse 76   Ht 5\' 2"  (1.575 m)   Wt 179 lb 12.8 oz (81.6 kg)   SpO2 98%   BMI 32.89 kg/m   Body mass index is 32.89 kg/m.   Tobacco Social History   Tobacco Use  Smoking Status Former Smoker  . Packs/day: 0.30  . Years: 8.00  . Pack years: 2.40  . Types: Cigarettes  Smokeless Tobacco Never Used     Counseling given: Not Answered   Past Medical History:  Diagnosis Date  . Allergic rhinitis   . Anemia 03/19/2013  . Anxiety   . Bouchard nodes (DJD hand)   . Carotid artery disease ([place]) 03/24/2014  . Cervical cancer screening 07/31/2015  . Dense breast tissue 03/03/2017  . Diabetes mellitus type II   . DJD (degenerative joint disease), lumbar   . Fibroids   . Glaucoma   . H/O diverticulitis of colon 03/03/2017  . History of DVT (deep vein thrombosis) 1994  . Hyperlipidemia   . Hypertension   . Insomnia 02/03/2016  . Nocturia 12/18/2012  . Obesity 07/31/2015  . Palpitations 12/18/2012   Past Surgical History:  Procedure Laterality Date  . ABDOMINAL HYSTERECTOMY    . CHOLECYSTECTOMY    . EYE SURGERY  09/2014, 08/2014   Cataracts removal  . KNEE ARTHROSCOPY     Family History  Problem Relation Age of Onset  . Diabetes Mother   . Kidney disease Mother   . COPD Father        smoker  . Stroke Sister   . Breast cancer Sister        s/p mastectomy  . COPD Brother   .  Stroke Unknown   . Diabetes Paternal Grandmother    Social History   Substance and Sexual Activity  Sexual Activity Yes  . Partners: Male   Comment: lives with husband, no dietary restrictions, minimizes dairy is walking more now    Outpatient Encounter Medications as of 09/01/2017  Medication Sig  . amLODipine (NORVASC) 10 MG tablet Take 1 tablet (10 mg total) by mouth daily.  Marland Kitchen aspirin 81 MG tablet Take 81 mg by mouth daily.    Marland Kitchen atorvastatin (LIPITOR) 40 MG tablet TAKE 1 AND 1/2 TABLETS BY MOUTH DAILY  . Cholecalciferol (VITAMIN D) 1000 UNITS capsule Take 1,000 Units by mouth. ocassionally  . Cyanocobalamin (B-12) 1000 MCG CAPS Take 1 tablet by mouth daily.  . fluticasone (FLONASE) 50 MCG/ACT nasal spray Place 2 sprays into both nostrils daily as needed for allergies or rhinitis.  Marland Kitchen glucose blood (GLUCOSE METER TEST) test strip To use with onetouch verio flex DX E11.9  . glucose blood (ONETOUCH VERIO) test strip Check blood sugar once daily.  DX E11.9  . hydrochlorothiazide (HYDRODIURIL) 12.5 MG tablet Take 1 tablet (12.5 mg total) by mouth daily.  Marland Kitchen losartan (COZAAR) 100 MG tablet TAKE 1 TABLET BY MOUTH EVERY DAY  .  metFORMIN (GLUCOPHAGE) 500 MG tablet Take 1 tablet (500 mg total) by mouth 2 (two) times daily with a meal.  . mometasone (NASONEX) 50 MCG/ACT nasal spray Place 2 sprays into the nose daily.  Marland Kitchen tiZANidine (ZANAFLEX) 2 MG tablet TAKE 1 TABLET(2 MG) BY MOUTH AT BEDTIME AS NEEDED FOR MUSCLE SPASMS  . nitroGLYCERIN (NITROSTAT) 0.4 MG SL tablet Place 0.4 mg under the tongue every 5 (five) minutes as needed for chest pain.  . [DISCONTINUED] gabapentin (NEURONTIN) 100 MG capsule Take 1 capsule (100 mg total) by mouth at bedtime.   No facility-administered encounter medications on file as of 09/01/2017.     Activities of Daily Living In your present state of health, do you have any difficulty performing the following activities: 09/01/2017 03/03/2017  Hearing? N N  Vision?  N N  Comment hx cataract sx -  Difficulty concentrating or making decisions? N N  Walking or climbing stairs? N N  Dressing or bathing? N N  Doing errands, shopping? N N  Preparing Food and eating ? N -  Using the Toilet? N -  In the past six months, have you accidently leaked urine? N -  Do you have problems with loss of bowel control? N -  Managing your Medications? N -  Managing your Finances? N -  Housekeeping or managing your Housekeeping? N -  Some recent data might be hidden    Patient Care Team: Mosie Lukes, MD as PCP - General (Family Medicine) Stanford Breed Denice Bors, MD as Consulting Physician (Cardiology)    Assessment:    Physical assessment deferred to PCP. Exercise Activities and Dietary recommendations Current Exercise Habits: Home exercise routine, Type of exercise: walking, Time (Minutes): 30, Frequency (Times/Week): 2, Weekly Exercise (Minutes/Week): 60, Intensity: Mild Diet (meal preparation, eat out, water intake, caffeinated beverages, dairy products, fruits and vegetables): in general, a "healthy" diet     Goal: Maintain current health  Fall Risk Fall Risk  09/01/2017 03/03/2017 12/02/2015 01/21/2015  Falls in the past year? No No No No   Depression Screen PHQ 2/9 Scores 09/01/2017 03/03/2017 12/02/2015 01/21/2015  PHQ - 2 Score 0 0 0 0  Exception Documentation - - - Patient refusal     Cognitive Function MMSE - Mini Mental State Exam 09/01/2017 12/02/2015  Orientation to time 5 5  Orientation to Place 5 5  Registration 3 3  Attention/ Calculation 5 5  Recall 3 3  Language- name 2 objects 2 2  Language- repeat 1 1  Language- follow 3 step command 3 3  Language- read & follow direction 1 1  Write a sentence 1 1  Copy design 1 1  Total score 30 30        Immunization History  Administered Date(s) Administered  . Influenza Split 06/23/2012  . Influenza Whole 08/09/2008, 07/31/2009, 06/19/2010  . Influenza, High Dose Seasonal PF 07/08/2016,  07/27/2017  . Influenza,inj,Quad PF,6+ Mos 06/25/2013, 07/22/2014, 07/31/2015  . Pneumococcal Conjugate-13 02/03/2016  . Pneumococcal Polysaccharide-23 08/06/2011  . Td 12/16/2004, 09/02/2016  . Zoster 01/03/2007   Screening Tests Health Maintenance  Topic Date Due  . FOOT EXAM  12/01/2016  . HEMOGLOBIN A1C  01/05/2018  . OPHTHALMOLOGY EXAM  02/11/2018  . MAMMOGRAM  12/17/2018  . Fecal DNA (Cologuard)  02/09/2019  . TETANUS/TDAP  09/02/2026  . INFLUENZA VACCINE  Completed  . DEXA SCAN  Completed  . Hepatitis C Screening  Completed  . PNA vac Low Risk Adult  Completed  Plan:   Follow up with PCP today as scheduled  Continue to eat heart healthy diet (full of fruits, vegetables, whole grains, lean protein, water--limit salt, fat, and sugar intake) and increase physical activity as tolerated.  Continue doing brain stimulating activities (puzzles, reading, adult coloring books, staying active) to keep memory sharp.    I have personally reviewed and noted the following in the patient's chart:   . Medical and social history . Use of alcohol, tobacco or illicit drugs  . Current medications and supplements . Functional ability and status . Nutritional status . Physical activity . Advanced directives . List of other physicians . Hospitalizations, surgeries, and ER visits in previous 12 months . Vitals . Screenings to include cognitive, depression, and falls . Referrals and appointments  In addition, I have reviewed and discussed with patient certain preventive protocols, quality metrics, and best practice recommendations. A written personalized care plan for preventive services as well as general preventive health recommendations were provided to patient.     Shela Nevin, [place]  09/01/2017

## 2017-09-01 ENCOUNTER — Encounter: Payer: Self-pay | Admitting: Family Medicine

## 2017-09-01 ENCOUNTER — Ambulatory Visit (INDEPENDENT_AMBULATORY_CARE_PROVIDER_SITE_OTHER): Payer: Medicare Other | Admitting: Family Medicine

## 2017-09-01 VITALS — BP 140/62 | HR 76 | Ht 62.0 in | Wt 179.8 lb

## 2017-09-01 DIAGNOSIS — E669 Obesity, unspecified: Secondary | ICD-10-CM

## 2017-09-01 DIAGNOSIS — E1169 Type 2 diabetes mellitus with other specified complication: Secondary | ICD-10-CM

## 2017-09-01 DIAGNOSIS — E785 Hyperlipidemia, unspecified: Secondary | ICD-10-CM | POA: Diagnosis not present

## 2017-09-01 DIAGNOSIS — E119 Type 2 diabetes mellitus without complications: Secondary | ICD-10-CM

## 2017-09-01 DIAGNOSIS — I10 Essential (primary) hypertension: Secondary | ICD-10-CM | POA: Diagnosis not present

## 2017-09-01 DIAGNOSIS — Z0000 Encounter for general adult medical examination without abnormal findings: Secondary | ICD-10-CM

## 2017-09-01 NOTE — Assessment & Plan Note (Signed)
Well controlled, no changes to meds. Encouraged heart healthy diet such as the DASH diet and exercise as tolerated.  °

## 2017-09-01 NOTE — Assessment & Plan Note (Signed)
Encouraged heart healthy diet, increase exercise, avoid trans fats, consider a krill oil cap daily 

## 2017-09-01 NOTE — Patient Instructions (Signed)
Heather Roberts , Thank you for taking time to come for your Medicare Wellness Visit. I appreciate your ongoing commitment to your health goals. Please review the following plan we discussed and let me know if I can assist you in the future.   These are the goals we discussed:Maintain current health.    This is a list of the screening recommended for you and due dates:  Health Maintenance  Topic Date Due  . Complete foot exam   12/01/2016  . Hemoglobin A1C  01/05/2018  . Eye exam for diabetics  02/11/2018  . Mammogram  12/17/2018  . Cologuard (Stool DNA test)  02/09/2019  . Tetanus Vaccine  09/02/2026  . Flu Shot  Completed  . DEXA scan (bone density measurement)  Completed  .  Hepatitis C: One time screening is recommended by Center for Disease Control  (CDC) for  adults born from [birth year] through 1965.   Completed  . Pneumonia vaccines  Completed   Continue to eat heart healthy diet (full of fruits, vegetables, whole grains, lean protein, water--limit salt, fat, and sugar intake) and increase physical activity as tolerated.  Continue doing brain stimulating activities (puzzles, reading, adult coloring books, staying active) to keep memory sharp.    Health Maintenance for Postmenopausal Women Menopause is a normal process in which your reproductive ability comes to an end. This process happens gradually over a span of months to years, usually between the ages of 52 and 66. Menopause is complete when you have missed 12 consecutive menstrual periods. It is important to talk with your health care provider about some of the most common conditions that affect postmenopausal women, such as heart disease, cancer, and bone loss (osteoporosis). Adopting a healthy lifestyle and getting preventive care can help to promote your health and wellness. Those actions can also lower your chances of developing some of these common conditions. What should I know about menopause? During menopause, you  may experience a number of symptoms, such as:  Moderate-to-severe hot flashes.  Night sweats.  Decrease in sex drive.  Mood swings.  Headaches.  Tiredness.  Irritability.  Memory problems.  Insomnia.  Choosing to treat or not to treat menopausal changes is an individual decision that you make with your health care provider. What should I know about hormone replacement therapy and supplements? Hormone therapy products are effective for treating symptoms that are associated with menopause, such as hot flashes and night sweats. Hormone replacement carries certain risks, especially as you become older. If you are thinking about using estrogen or estrogen with progestin treatments, discuss the benefits and risks with your health care provider. What should I know about heart disease and stroke? Heart disease, heart attack, and stroke become more likely as you age. This may be due, in part, to the hormonal changes that your body experiences during menopause. These can affect how your body processes dietary fats, triglycerides, and cholesterol. Heart attack and stroke are both medical emergencies. There are many things that you can do to help prevent heart disease and stroke:  Have your blood pressure checked at least every 1-2 years. High blood pressure causes heart disease and increases the risk of stroke.  If you are 82-64 years old, ask your health care provider if you should take aspirin to prevent a heart attack or a stroke.  Do not use any tobacco products, including cigarettes, chewing tobacco, or electronic cigarettes. If you need help quitting, ask your health care provider.  It is important  to eat a healthy diet and maintain a healthy weight. ? Be sure to include plenty of vegetables, fruits, low-fat dairy products, and lean protein. ? Avoid eating foods that are high in solid fats, added sugars, or salt (sodium).  Get regular exercise. This is one of the most important things  that you can do for your health. ? Try to exercise for at least 150 minutes each week. The type of exercise that you do should increase your heart rate and make you sweat. This is known as moderate-intensity exercise. ? Try to do strengthening exercises at least twice each week. Do these in addition to the moderate-intensity exercise.  Know your numbers.Ask your health care provider to check your cholesterol and your blood glucose. Continue to have your blood tested as directed by your health care provider.  What should I know about cancer screening? There are several types of cancer. Take the following steps to reduce your risk and to catch any cancer development as early as possible. Breast Cancer  Practice breast self-awareness. ? This means understanding how your breasts normally appear and feel. ? It also means doing regular breast self-exams. Let your health care provider know about any changes, no matter how small.  If you are 22 or older, have a clinician do a breast exam (clinical breast exam or CBE) every year. Depending on your age, family history, and medical history, it may be recommended that you also have a yearly breast X-ray (mammogram).  If you have a family history of breast cancer, talk with your health care provider about genetic screening.  If you are at high risk for breast cancer, talk with your health care provider about having an MRI and a mammogram every year.  Breast cancer (BRCA) gene test is recommended for women who have family members with BRCA-related cancers. Results of the assessment will determine the need for genetic counseling and BRCA1 and for BRCA2 testing. BRCA-related cancers include these types: ? Breast. This occurs in males or females. ? Ovarian. ? Tubal. This may also be called fallopian tube cancer. ? Cancer of the abdominal or pelvic lining (peritoneal cancer). ? Prostate. ? Pancreatic.  Cervical, Uterine, and Ovarian Cancer Your health  care provider may recommend that you be screened regularly for cancer of the pelvic organs. These include your ovaries, uterus, and vagina. This screening involves a pelvic exam, which includes checking for microscopic changes to the surface of your cervix (Pap test).  For women ages 21-65, health care providers may recommend a pelvic exam and a Pap test every three years. For women ages 60-65, they may recommend the Pap test and pelvic exam, combined with testing for human papilloma virus (HPV), every five years. Some types of HPV increase your risk of cervical cancer. Testing for HPV may also be done on women of any age who have unclear Pap test results.  Other health care providers may not recommend any screening for nonpregnant women who are considered low risk for pelvic cancer and have no symptoms. Ask your health care provider if a screening pelvic exam is right for you.  If you have had past treatment for cervical cancer or a condition that could lead to cancer, you need Pap tests and screening for cancer for at least 20 years after your treatment. If Pap tests have been discontinued for you, your risk factors (such as having a new sexual partner) need to be reassessed to determine if you should start having screenings again. Some women  have medical problems that increase the chance of getting cervical cancer. In these cases, your health care provider may recommend that you have screening and Pap tests more often.  If you have a family history of uterine cancer or ovarian cancer, talk with your health care provider about genetic screening.  If you have vaginal bleeding after reaching menopause, tell your health care provider.  There are currently no reliable tests available to screen for ovarian cancer.  Lung Cancer Lung cancer screening is recommended for adults 36-66 years old who are at high risk for lung cancer because of a history of smoking. A yearly low-dose CT scan of the lungs is  recommended if you:  Currently smoke.  Have a history of at least 30 pack-years of smoking and you currently smoke or have quit within the past 15 years. A pack-year is smoking an average of one pack of cigarettes per day for one year.  Yearly screening should:  Continue until it has been 15 years since you quit.  Stop if you develop a health problem that would prevent you from having lung cancer treatment.  Colorectal Cancer  This type of cancer can be detected and can often be prevented.  Routine colorectal cancer screening usually begins at age 73 and continues through age 41.  If you have risk factors for colon cancer, your health care provider may recommend that you be screened at an earlier age.  If you have a family history of colorectal cancer, talk with your health care provider about genetic screening.  Your health care provider may also recommend using home test kits to check for hidden blood in your stool.  A small camera at the end of a tube can be used to examine your colon directly (sigmoidoscopy or colonoscopy). This is done to check for the earliest forms of colorectal cancer.  Direct examination of the colon should be repeated every 5-10 years until age 35. However, if early forms of precancerous polyps or small growths are found or if you have a family history or genetic risk for colorectal cancer, you may need to be screened more often.  Skin Cancer  Check your skin from head to toe regularly.  Monitor any moles. Be sure to tell your health care provider: ? About any new moles or changes in moles, especially if there is a change in a mole's shape or color. ? If you have a mole that is larger than the size of a pencil eraser.  If any of your family members has a history of skin cancer, especially at a young age, talk with your health care provider about genetic screening.  Always use sunscreen. Apply sunscreen liberally and repeatedly throughout the  day.  Whenever you are outside, protect yourself by wearing long sleeves, pants, a wide-brimmed hat, and sunglasses.  What should I know about osteoporosis? Osteoporosis is a condition in which bone destruction happens more quickly than new bone creation. After menopause, you may be at an increased risk for osteoporosis. To help prevent osteoporosis or the bone fractures that can happen because of osteoporosis, the following is recommended:  If you are 71-53 years old, get at least 1,000 mg of calcium and at least 600 mg of vitamin D per day.  If you are older than age 46 but younger than age 20, get at least 1,200 mg of calcium and at least 600 mg of vitamin D per day.  If you are older than age 33, get at least  1,200 mg of calcium and at least 800 mg of vitamin D per day.  Smoking and excessive alcohol intake increase the risk of osteoporosis. Eat foods that are rich in calcium and vitamin D, and do weight-bearing exercises several times each week as directed by your health care provider. What should I know about how menopause affects my mental health? Depression may occur at any age, but it is more common as you become older. Common symptoms of depression include:  Low or sad mood.  Changes in sleep patterns.  Changes in appetite or eating patterns.  Feeling an overall lack of motivation or enjoyment of activities that you previously enjoyed.  Frequent crying spells.  Talk with your health care provider if you think that you are experiencing depression. What should I know about immunizations? It is important that you get and maintain your immunizations. These include:  Tetanus, diphtheria, and pertussis (Tdap) booster vaccine.  Influenza every year before the flu season begins.  Pneumonia vaccine.  Shingles vaccine.  Your health care provider may also recommend other immunizations. This information is not intended to replace advice given to you by your health care provider.  Make sure you discuss any questions you have with your health care provider. Document Released: 11/26/2005 Document Revised: 04/23/2016 Document Reviewed: 07/08/2015 Elsevier Interactive Patient Education  2018 [employer].

## 2017-09-01 NOTE — Assessment & Plan Note (Signed)
hgba1c acceptable, minimize simple carbs. Increase exercise as tolerated.  

## 2017-09-04 NOTE — Progress Notes (Signed)
Patient ID: Heather Roberts, female   DOB: [birth year], 71 y.o.   MRN: [id number]   Subjective:    Patient ID: Heather Roberts, female    DOB: 05-21-1946, 71 y.o.   MRN: [id number]  Chief Complaint  Patient presents with  . Medicare Wellness    HPI Patient is in today for follow up. She feels well today. No recent febrile illness or hospitalizations. She notes her recent blood sugars have been ranging from 98 to 148. No polyuria or polydipsia. She denies any concerns but notes the numbers were higher when she ate cake or candy, Denies CP/palp/SOB/HA/congestion/fevers/GI or GU c/o. Taking meds as prescribed  Past Medical History:  Diagnosis Date  . Allergic rhinitis   . Anemia 03/19/2013  . Anxiety   . Bouchard nodes (DJD hand)   . Carotid artery disease ([place]) 03/24/2014  . Cervical cancer screening 07/31/2015  . Dense breast tissue 03/03/2017  . Diabetes mellitus type II   . DJD (degenerative joint disease), lumbar   . Fibroids   . Glaucoma   . H/O diverticulitis of colon 03/03/2017  . History of DVT (deep vein thrombosis) 1994  . Hyperlipidemia   . Hypertension   . Insomnia 02/03/2016  . Nocturia 12/18/2012  . Obesity 07/31/2015  . Palpitations 12/18/2012    Past Surgical History:  Procedure Laterality Date  . ABDOMINAL HYSTERECTOMY    . CHOLECYSTECTOMY    . EYE SURGERY  09/2014, 08/2014   Cataracts removal  . KNEE ARTHROSCOPY      Family History  Problem Relation Age of Onset  . Diabetes Mother   . Kidney disease Mother   . COPD Father        smoker  . Stroke Sister   . Breast cancer Sister        s/p mastectomy  . COPD Brother   . Stroke Unknown   . Diabetes Paternal Grandmother     Social History   Socioeconomic History  . Marital status: Married    Spouse name: Essick  . Number of children: 3  . Years of education: Not on file  . Highest education level: Not on file  Social Needs  . Financial resource strain: Not on file  . Food insecurity  - worry: Not on file  . Food insecurity - inability: Not on file  . Transportation needs - medical: Not on file  . Transportation needs - non-medical: Not on file  Occupational History  . Occupation: Scientist, research (physical sciences): RETIRED    Comment: used to work for [employer]  . Smoking status: Former Smoker    Packs/day: 0.30    Years: 8.00    Pack years: 2.40    Types: Cigarettes  . Smokeless tobacco: Never Used  Substance and Sexual Activity  . Alcohol use: Yes    Alcohol/week: 0.0 oz    Comment: Rarely---glass of wine with a meeting  . Drug use: No  . Sexual activity: Yes    Partners: Male    Comment: lives with husband, no dietary restrictions, minimizes dairy is walking more now  Other Topics Concern  . 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 amLODipine (NORVASC) 10 MG tablet Take 1 tablet (10 mg total) by mouth daily. 90 tablet 1  . aspirin 81 MG tablet Take 81 mg by mouth daily.      Marland Kitchen atorvastatin (LIPITOR) 40 MG tablet TAKE 1 AND 1/2 TABLETS BY MOUTH DAILY 135 tablet 3  . Cholecalciferol (VITAMIN D) 1000 UNITS capsule Take 1,000 Units by mouth. ocassionally    . Cyanocobalamin (B-12) 1000 MCG CAPS Take 1 tablet by mouth daily.    . fluticasone (FLONASE) 50 MCG/ACT nasal spray Place 2 sprays into both nostrils daily as needed for allergies or rhinitis. 16 g 6  . glucose blood (GLUCOSE METER TEST) test strip To use with onetouch verio flex DX E11.9 100 each 6  . glucose blood (ONETOUCH VERIO) test strip Check blood sugar once daily.  DX E11.9 100 each 6  . hydrochlorothiazide (HYDRODIURIL) 12.5 MG tablet Take 1 tablet (12.5 mg total) by mouth daily. 30 tablet 5  . losartan (COZAAR) 100 MG tablet TAKE 1 TABLET BY MOUTH EVERY DAY 30  tablet 0  . metFORMIN (GLUCOPHAGE) 500 MG tablet Take 1 tablet (500 mg total) by mouth 2 (two) times daily with a meal. 180 tablet 3  . mometasone (NASONEX) 50 MCG/ACT nasal spray Place 2 sprays into the nose daily.    Marland Kitchen tiZANidine (ZANAFLEX) 2 MG tablet TAKE 1 TABLET(2 MG) BY MOUTH AT BEDTIME AS NEEDED FOR MUSCLE SPASMS 30 tablet 1  . nitroGLYCERIN (NITROSTAT) 0.4 MG SL tablet Place 0.4 mg under the tongue every 5 (five) minutes as needed for chest pain.    Marland Kitchen gabapentin (NEURONTIN) 100 MG capsule Take 1 capsule (100 mg total) by mouth at bedtime. 30 capsule 3   No facility-administered medications prior to visit.     Allergies  Allergen Reactions  . Tussionex Pennkinetic Er [Hydrocod Polst-Cpm Polst Er] Other (See Comments)    Upset stomach and loose stool     Review of Systems  Constitutional: Negative for fever and malaise/fatigue.  HENT: Negative for congestion.   Eyes: Negative for blurred vision.  Respiratory: Negative for shortness of breath.   Cardiovascular: Negative for chest pain, palpitations and leg swelling.  Gastrointestinal: Negative for abdominal pain, blood in stool and nausea.  Genitourinary: Negative for dysuria and frequency.  Musculoskeletal: Negative for falls.  Skin: Negative for rash.  Neurological: Negative for dizziness, loss of consciousness and headaches.  Endo/Heme/Allergies: Negative for environmental allergies.  Psychiatric/Behavioral: Negative for depression. The patient is not nervous/anxious.        Objective:    Physical Exam  Constitutional: She is oriented to person, place, and time. She appears well-developed and well-nourished. No distress.  HENT:  Head: Normocephalic and atraumatic.  Nose: Nose normal.  Eyes: Right eye exhibits no discharge. Left eye exhibits no discharge.  Neck: Normal range of motion. Neck supple.  Cardiovascular: Normal rate and regular rhythm.  No murmur heard. Pulmonary/Chest: Effort normal and breath sounds  normal.  Abdominal: Soft. Bowel sounds are normal. There is no tenderness.  Musculoskeletal: She exhibits no edema.  Neurological: She is alert and oriented to person, place, and time.  Skin: Skin is warm and dry.  Psychiatric: She has a normal mood and affect.  Nursing note and vitals reviewed.   BP 140/62 (BP Location: Left Arm, Patient Position: Sitting, Cuff Size: Normal)   Pulse 76   Ht 5\' 2"  (1.575 m)   Wt 179 lb 12.8 oz (81.6 kg)   SpO2  98%   BMI 32.89 kg/m  Wt Readings from Last 3 Encounters:  09/01/17 179 lb 12.8 oz (81.6 kg)  07/21/17 174 lb (78.9 kg)  06/13/17 176 lb (79.8 kg)     Lab Results  Component Value Date   WBC 6.5 07/08/2017   HGB 12.1 07/08/2017   HCT 37.6 07/08/2017   PLT 255.0 07/08/2017   GLUCOSE 149 (H) 07/08/2017   CHOL 131 07/08/2017   TRIG 125.0 07/08/2017   HDL 58.30 07/08/2017   LDLDIRECT 118.5 10/04/2007   LDLCALC 48 07/08/2017   ALT 16 07/08/2017   AST 21 07/08/2017   NA 136 07/08/2017   K 4.5 07/08/2017   CL 99 07/08/2017   CREATININE 1.07 07/08/2017   BUN 17 07/08/2017   CO2 29 07/08/2017   TSH 1.64 07/08/2017   HGBA1C 7.0 (H) 07/08/2017   MICROALBUR 0.50 06/14/2012    Lab Results  Component Value Date   TSH 1.64 07/08/2017   Lab Results  Component Value Date   WBC 6.5 07/08/2017   HGB 12.1 07/08/2017   HCT 37.6 07/08/2017   MCV 95.6 07/08/2017   PLT 255.0 07/08/2017   Lab Results  Component Value Date   NA 136 07/08/2017   K 4.5 07/08/2017   CO2 29 07/08/2017   GLUCOSE 149 (H) 07/08/2017   BUN 17 07/08/2017   CREATININE 1.07 07/08/2017   BILITOT 0.8 07/08/2017   ALKPHOS 97 07/08/2017   AST 21 07/08/2017   ALT 16 07/08/2017   PROT 7.4 07/08/2017   ALBUMIN 4.1 07/08/2017   CALCIUM 9.9 07/08/2017   GFR 65.00 07/08/2017   Lab Results  Component Value Date   CHOL 131 07/08/2017   Lab Results  Component Value Date   HDL 58.30 07/08/2017   Lab Results  Component Value Date   LDLCALC 48 07/08/2017    Lab Results  Component Value Date   TRIG 125.0 07/08/2017   Lab Results  Component Value Date   CHOLHDL 2 07/08/2017   Lab Results  Component Value Date   HGBA1C 7.0 (H) 07/08/2017       Assessment & Plan:   Problem List Items Addressed This Visit    Diabetes mellitus type 2 in obese ([place])    hgba1c acceptable, minimize simple carbs. Increase exercise as tolerated.       Relevant Orders   Hemoglobin A1c   Hyperlipidemia associated with type 2 diabetes mellitus ([place])    Encouraged heart healthy diet, increase exercise, avoid trans fats, consider a krill oil cap daily      Relevant Orders   Lipid panel   Essential hypertension    Well controlled, no changes to meds. Encouraged heart healthy diet such as the DASH diet and exercise as tolerated.       Relevant Orders   CBC   Comprehensive metabolic panel   TSH    Other Visit Diagnoses    Encounter for Medicare annual wellness exam    -  Primary      I have discontinued Meylin Stenzel. Watkins Williams's gabapentin. I am also having her maintain her aspirin, Vitamin D, fluticasone, losartan, B-12, glucose blood, glucose blood, nitroGLYCERIN, mometasone, tiZANidine, atorvastatin, metFORMIN, hydrochlorothiazide, and amLODipine.  No orders of the defined types were placed in this encounter.   CMA served as Education administrator during this visit. History, Physical and Plan performed by medical provider. Documentation and orders reviewed and attested to.  [name], MD

## 2017-11-14 ENCOUNTER — Other Ambulatory Visit: Payer: Self-pay | Admitting: Family Medicine

## 2017-11-14 DIAGNOSIS — Z1231 Encounter for screening mammogram for malignant neoplasm of breast: Secondary | ICD-10-CM

## 2017-12-01 ENCOUNTER — Other Ambulatory Visit (INDEPENDENT_AMBULATORY_CARE_PROVIDER_SITE_OTHER): Payer: Medicare Other

## 2017-12-01 DIAGNOSIS — I10 Essential (primary) hypertension: Secondary | ICD-10-CM | POA: Diagnosis not present

## 2017-12-01 DIAGNOSIS — E785 Hyperlipidemia, unspecified: Secondary | ICD-10-CM

## 2017-12-01 DIAGNOSIS — E669 Obesity, unspecified: Secondary | ICD-10-CM | POA: Diagnosis not present

## 2017-12-01 DIAGNOSIS — E1169 Type 2 diabetes mellitus with other specified complication: Secondary | ICD-10-CM | POA: Diagnosis not present

## 2017-12-01 LAB — CBC
HEMATOCRIT: 38.7 % (ref 36.0–46.0)
Hemoglobin: 12.7 g/dL (ref 12.0–15.0)
MCHC: 32.9 g/dL (ref 30.0–36.0)
MCV: 93.9 fl (ref 78.0–100.0)
Platelets: 250 10*3/uL (ref 150.0–400.0)
RBC: 4.13 Mil/uL (ref 3.87–5.11)
RDW: 14.3 % (ref 11.5–15.5)
WBC: 5.8 10*3/uL (ref 4.0–10.5)

## 2017-12-01 LAB — COMPREHENSIVE METABOLIC PANEL
ALT: 17 U/L (ref 0–35)
AST: 23 U/L (ref 0–37)
Albumin: 4.2 g/dL (ref 3.5–5.2)
Alkaline Phosphatase: 116 U/L (ref 39–117)
BUN: 16 mg/dL (ref 6–23)
CHLORIDE: 100 meq/L (ref 96–112)
CO2: 28 meq/L (ref 19–32)
CREATININE: 1.04 mg/dL (ref 0.40–1.20)
Calcium: 9.6 mg/dL (ref 8.4–10.5)
GFR: 67.09 mL/min (ref >60.00)
GLUCOSE: 183 mg/dL — AB (ref 70–99)
POTASSIUM: 4 meq/L (ref 3.5–5.1)
SODIUM: 135 meq/L (ref 135–145)
Total Bilirubin: 0.8 mg/dL (ref 0.2–1.2)
Total Protein: 7.8 g/dL (ref 6.0–8.3)

## 2017-12-01 LAB — LIPID PANEL
CHOL/HDL RATIO: 2
Cholesterol: 137 mg/dL (ref 0–200)
HDL: 55.9 mg/dL (ref >39.00)
LDL CALC: 62 mg/dL (ref 0–99)
NONHDL: 80.65
Triglycerides: 92 mg/dL (ref 0.0–149.0)
VLDL: 18.4 mg/dL (ref 0.0–40.0)

## 2017-12-01 LAB — TSH: TSH: 1.31 u[IU]/mL (ref 0.35–4.50)

## 2017-12-01 LAB — HEMOGLOBIN A1C: Hgb A1c MFr Bld: 7.1 % — ABNORMAL HIGH (ref 4.6–6.5)

## 2017-12-02 ENCOUNTER — Other Ambulatory Visit: Payer: Medicare Other

## 2017-12-06 ENCOUNTER — Ambulatory Visit (INDEPENDENT_AMBULATORY_CARE_PROVIDER_SITE_OTHER): Payer: Medicare Other | Admitting: Family Medicine

## 2017-12-06 ENCOUNTER — Encounter: Payer: Self-pay | Admitting: Family Medicine

## 2017-12-06 DIAGNOSIS — R519 Headache, unspecified: Secondary | ICD-10-CM | POA: Insufficient documentation

## 2017-12-06 DIAGNOSIS — E1169 Type 2 diabetes mellitus with other specified complication: Secondary | ICD-10-CM | POA: Diagnosis not present

## 2017-12-06 DIAGNOSIS — E669 Obesity, unspecified: Secondary | ICD-10-CM | POA: Diagnosis not present

## 2017-12-06 DIAGNOSIS — M79641 Pain in right hand: Secondary | ICD-10-CM | POA: Insufficient documentation

## 2017-12-06 DIAGNOSIS — I10 Essential (primary) hypertension: Secondary | ICD-10-CM

## 2017-12-06 DIAGNOSIS — E785 Hyperlipidemia, unspecified: Secondary | ICD-10-CM

## 2017-12-06 DIAGNOSIS — R51 Headache: Secondary | ICD-10-CM

## 2017-12-06 MED ORDER — LOSARTAN POTASSIUM 100 MG PO TABS: 100.0000 mg | ORAL_TABLET | Freq: Every day | ORAL | 3 refills | Status: DC

## 2017-12-06 NOTE — Assessment & Plan Note (Signed)
Well controlled, no changes to meds. Encouraged heart healthy diet such as the DASH diet and exercise as tolerated.  °

## 2017-12-06 NOTE — Progress Notes (Signed)
Subjective:  I acted as a Education administrator for Dr. Charlett Blake. [place], [place]  Patient ID: Heather Roberts, female    DOB: 1946/05/03, 72 y.o.   MRN: [id number]  No chief complaint on file.   HPI  Patient is in today for a 3 month follow up and she is feeling well today. She had a recent injury when a case of water fell on her from above and she injured her right side. The injuries have all improved except her right second knuckle while it is improving is still sore and swollen. No redness or symptoms into finger at this time. No other acute concerns or hospitalizations. Denies CP/palp/SOB/HA/congestion/fevers/GI or GU c/o. Taking meds as prescribed  Patient Care Team: Mosie Lukes, MD as PCP - General (Family Medicine) Stanford Breed Denice Bors, MD as Consulting Physician (Cardiology)   Past Medical History:  Diagnosis Date  . Allergic rhinitis   . Anemia 03/19/2013  . Anxiety   . Bouchard nodes (DJD hand)   . Carotid artery disease ([place]) 03/24/2014  . Cervical cancer screening 07/31/2015  . Dense breast tissue 03/03/2017  . Diabetes mellitus type II   . DJD (degenerative joint disease), lumbar   . Fibroids   . Glaucoma   . H/O diverticulitis of colon 03/03/2017  . History of DVT (deep vein thrombosis) 1994  . Hyperlipidemia   . Hypertension   . Insomnia 02/03/2016  . Nocturia 12/18/2012  . Obesity 07/31/2015  . Palpitations 12/18/2012    Past Surgical History:  Procedure Laterality Date  . ABDOMINAL HYSTERECTOMY    . CHOLECYSTECTOMY    . EYE SURGERY  09/2014, 08/2014   Cataracts removal  . KNEE ARTHROSCOPY      Family History  Problem Relation Age of Onset  . Diabetes Mother   . Kidney disease Mother   . COPD Father        smoker  . Stroke Sister   . Breast cancer Sister        s/p mastectomy  . COPD Brother   . Stroke Brother   . Stroke Unknown   . Diabetes Paternal Grandmother     Social History   Socioeconomic History  . Marital status: Married    Spouse name: Essick   . Number of children: 3  . Years of education: Not on file  . Highest education level: Not on file  Social Needs  . Financial resource strain: Not on file  . Food insecurity - worry: Not on file  . Food insecurity - inability: Not on file  . Transportation needs - medical: Not on file  . Transportation needs - non-medical: Not on file  Occupational History  . Occupation: Scientist, research (physical sciences): RETIRED    Comment: used to work for [employer]  . Smoking status: Former Smoker    Packs/day: 0.30    Years: 8.00    Pack years: 2.40    Types: Cigarettes  . Smokeless tobacco: Never Used  Substance and Sexual Activity  . Alcohol use: Yes    Alcohol/week: 0.0 oz    Comment: Rarely---glass of wine with a meeting  . Drug use: No  . Sexual activity: Yes    Partners: Male    Comment: lives with husband, no dietary restrictions, minimizes dairy is walking more now  Other Topics Concern  . 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 amLODipine (NORVASC) 10 MG tablet Take 1 tablet (10 mg total) by mouth daily. 90 tablet 1  . aspirin 81 MG tablet Take 81 mg by mouth daily.      Marland Kitchen atorvastatin (LIPITOR) 40 MG tablet TAKE 1 AND 1/2 TABLETS BY MOUTH DAILY 135 tablet 3  . Cholecalciferol (VITAMIN D) 1000 UNITS capsule Take 1,000 Units by mouth. ocassionally    . Cyanocobalamin (B-12) 1000 MCG CAPS Take 1 tablet by mouth daily.    . fluticasone (FLONASE) 50 MCG/ACT nasal spray Place 2 sprays into both nostrils daily as needed for allergies or rhinitis. 16 g 6  . glucose blood (GLUCOSE METER TEST) test strip To use with onetouch verio flex DX E11.9 100 each 6  . glucose blood (ONETOUCH VERIO) test strip Check blood sugar once daily.  DX E11.9 100 each 6    . hydrochlorothiazide (HYDRODIURIL) 12.5 MG tablet Take 1 tablet (12.5 mg total) by mouth daily. 30 tablet 5  . metFORMIN (GLUCOPHAGE) 500 MG tablet Take 1 tablet (500 mg total) by mouth 2 (two) times daily with a meal. 180 tablet 3  . mometasone (NASONEX) 50 MCG/ACT nasal spray Place 2 sprays into the nose daily.    . nitroGLYCERIN (NITROSTAT) 0.4 MG SL tablet Place 0.4 mg under the tongue every 5 (five) minutes as needed for chest pain.    Marland Kitchen tiZANidine (ZANAFLEX) 2 MG tablet TAKE 1 TABLET(2 MG) BY MOUTH AT BEDTIME AS NEEDED FOR MUSCLE SPASMS 30 tablet 1  . losartan (COZAAR) 100 MG tablet TAKE 1 TABLET BY MOUTH EVERY DAY 30 tablet 0   No facility-administered medications prior to visit.     Allergies  Allergen Reactions  . Tussionex Pennkinetic Er [Hydrocod Polst-Cpm Polst Er] Other (See Comments)    Upset stomach and loose stool     Review of Systems  Constitutional: Negative for fever and malaise/fatigue.  HENT: Negative for congestion.   Eyes: Negative for blurred vision.  Respiratory: Negative for shortness of breath.   Cardiovascular: Negative for chest pain, palpitations and leg swelling.  Gastrointestinal: Negative for abdominal pain, blood in stool and nausea.  Genitourinary: Negative for dysuria and frequency.  Musculoskeletal: Positive for joint pain. Negative for falls.  Skin: Negative for rash.  Neurological: Negative for dizziness, loss of consciousness and headaches.  Endo/Heme/Allergies: Negative for environmental allergies.  Psychiatric/Behavioral: Negative for depression. The patient is not nervous/anxious.        Objective:    Physical Exam  Constitutional: She is oriented to person, place, and time. She appears well-developed and well-nourished. No distress.  HENT:  Head: Normocephalic and atraumatic.  Nose: Nose normal.  Eyes: Right eye exhibits no discharge. Left eye exhibits no discharge.  Neck: Normal range of motion. Neck supple.  Cardiovascular:  Normal rate and regular rhythm.  No murmur heard. Pulmonary/Chest: Effort normal and breath sounds normal.  Abdominal: Soft. Bowel sounds are normal. There is no tenderness.  Musculoskeletal: She exhibits no edema.  Right 2nd knuckle is thick and mildly tender with palp  Neurological: She is alert and oriented to person, place, and time.  Skin: Skin is warm and dry.  Psychiatric: She has a normal mood and affect.  Nursing note and vitals reviewed.   BP 138/82 (BP  Location: Left Arm, Patient Position: Sitting, Cuff Size: Normal)   Pulse 67   Temp 97.8 F (36.6 C) (Oral)   Resp 18   Wt 181 lb 9.6 oz (82.4 kg)   SpO2 97%   BMI 33.22 kg/m  Wt Readings from Last 3 Encounters:  12/06/17 181 lb 9.6 oz (82.4 kg)  09/01/17 179 lb 12.8 oz (81.6 kg)  07/21/17 174 lb (78.9 kg)   BP Readings from Last 3 Encounters:  12/06/17 138/82  09/01/17 140/62  07/21/17 (!) 122/53     Immunization History  Administered Date(s) Administered  . Influenza Split 06/23/2012  . Influenza Whole 08/09/2008, 07/31/2009, 06/19/2010  . Influenza, High Dose Seasonal PF 07/08/2016, 07/27/2017  . Influenza,inj,Quad PF,6+ Mos 06/25/2013, 07/22/2014, 07/31/2015  . Pneumococcal Conjugate-13 02/03/2016  . Pneumococcal Polysaccharide-23 08/06/2011  . Td 12/16/2004, 09/02/2016  . Zoster 01/03/2007    Health Maintenance  Topic Date Due  . FOOT EXAM  12/01/2016  . OPHTHALMOLOGY EXAM  02/11/2018  . HEMOGLOBIN A1C  05/31/2018  . MAMMOGRAM  12/17/2018  . Fecal DNA (Cologuard)  02/09/2019  . TETANUS/TDAP  09/02/2026  . INFLUENZA VACCINE  Completed  . DEXA SCAN  Completed  . Hepatitis C Screening  Completed  . PNA vac Low Risk Adult  Completed    Lab Results  Component Value Date   WBC 5.8 12/01/2017   HGB 12.7 12/01/2017   HCT 38.7 12/01/2017   PLT 250.0 12/01/2017   GLUCOSE 183 (H) 12/01/2017   CHOL 137 12/01/2017   TRIG 92.0 12/01/2017   HDL 55.90 12/01/2017   LDLDIRECT 118.5 10/04/2007   LDLCALC  62 12/01/2017   ALT 17 12/01/2017   AST 23 12/01/2017   NA 135 12/01/2017   K 4.0 12/01/2017   CL 100 12/01/2017   CREATININE 1.04 12/01/2017   BUN 16 12/01/2017   CO2 28 12/01/2017   TSH 1.31 12/01/2017   HGBA1C 7.1 (H) 12/01/2017   MICROALBUR 0.50 06/14/2012    Lab Results  Component Value Date   TSH 1.31 12/01/2017   Lab Results  Component Value Date   WBC 5.8 12/01/2017   HGB 12.7 12/01/2017   HCT 38.7 12/01/2017   MCV 93.9 12/01/2017   PLT 250.0 12/01/2017   Lab Results  Component Value Date   NA 135 12/01/2017   K 4.0 12/01/2017   CO2 28 12/01/2017   GLUCOSE 183 (H) 12/01/2017   BUN 16 12/01/2017   CREATININE 1.04 12/01/2017   BILITOT 0.8 12/01/2017   ALKPHOS 116 12/01/2017   AST 23 12/01/2017   ALT 17 12/01/2017   PROT 7.8 12/01/2017   ALBUMIN 4.2 12/01/2017   CALCIUM 9.6 12/01/2017   GFR 67.09 12/01/2017   Lab Results  Component Value Date   CHOL 137 12/01/2017   Lab Results  Component Value Date   HDL 55.90 12/01/2017   Lab Results  Component Value Date   LDLCALC 62 12/01/2017   Lab Results  Component Value Date   TRIG 92.0 12/01/2017   Lab Results  Component Value Date   CHOLHDL 2 12/01/2017   Lab Results  Component Value Date   HGBA1C 7.1 (H) 12/01/2017         Assessment & Plan:   Problem List Items Addressed This Visit    Diabetes mellitus type 2 in obese ([place])    hgba1c acceptable, minimize simple carbs. Increase exercise as tolerated. Continue current meds      Relevant Medications   losartan (COZAAR) 100 MG tablet  Hyperlipidemia associated with type 2 diabetes mellitus ([place])    Tolerating statin, encouraged heart healthy diet, avoid trans fats, minimize simple carbs and saturated fats. Increase exercise as tolerated      Relevant Medications   losartan (COZAAR) 100 MG tablet   Essential hypertension    Well controlled, no changes to meds. Encouraged heart healthy diet such as the DASH diet and exercise as  tolerated.       Relevant Medications   losartan (COZAAR) 100 MG tablet   Right hand pain    Has hurt on knuckle of second finger since she injured it 3.5 weeks ago. Is improving. May use ice and Lidocaine gel prn      Headache    Describes pressure especially behind forehead. Encouraged increased hydration, 64 ounces of clear fluids daily. Minimize alcohol and caffeine. Eat small frequent meals with lean proteins and complex carbs. Avoid high and low blood sugars. Get adequate sleep, 7-8 hours a night. Needs exercise daily preferably in the morning.         I have changed [employer]. Watkins Williams's losartan. I am also having her maintain her aspirin, Vitamin D, fluticasone, B-12, glucose blood, glucose blood, nitroGLYCERIN, mometasone, tiZANidine, atorvastatin, metFORMIN, hydrochlorothiazide, and amLODipine.  Meds ordered this encounter  Medications  . losartan (COZAAR) 100 MG tablet    Sig: Take 1 tablet (100 mg total) by mouth daily.    Dispense:  30 tablet    Refill:  3    CMA served as scribe during this visit. History, Physical and Plan performed by medical provider. Documentation and orders reviewed and attested to.  [name], MD

## 2017-12-06 NOTE — Assessment & Plan Note (Signed)
Describes pressure especially behind forehead. Encouraged increased hydration, 64 ounces of clear fluids daily. Minimize alcohol and caffeine. Eat small frequent meals with lean proteins and complex carbs. Avoid high and low blood sugars. Get adequate sleep, 7-8 hours a night. Needs exercise daily preferably in the morning.

## 2017-12-06 NOTE — Assessment & Plan Note (Signed)
hgba1c acceptable, minimize simple carbs. Increase exercise as tolerated. Continue current meds 

## 2017-12-06 NOTE — Patient Instructions (Addendum)
shingrix is the new shingles shot 2 shots over 2-6 months at pharmacy Add ice and Lidocaine gel to hand twice daily for next week, report if worsens. General Headache Without Cause A headache is pain or discomfort felt around the head or neck area. The specific cause of a headache may not be found. There are many causes and types of headaches. A few common ones are:  Tension headaches.  Migraine headaches.  Cluster headaches.  Chronic daily headaches.  Follow these instructions at home: Watch your condition for any changes. Take these steps to help with your condition: Managing pain  Take over-the-counter and prescription medicines only as told by your health care provider.  Lie down in a dark, quiet room when you have a headache.  If directed, apply ice to the head and neck area: ? Put ice in a plastic bag. ? Place a towel between your skin and the bag. ? Leave the ice on for 20 minutes, 2-3 times per day.  Use a heating pad or hot shower to apply heat to the head and neck area as told by your health care provider.  Keep lights dim if bright lights bother you or make your headaches worse. Eating and drinking  Eat meals on a regular schedule.  Limit alcohol use.  Decrease the amount of caffeine you drink, or stop drinking caffeine. General instructions  Keep all follow-up visits as told by your health care provider. This is important.  Keep a headache journal to help find out what may trigger your headaches. For example, write down: ? What you eat and drink. ? How much sleep you get. ? Any change to your diet or medicines.  Try massage or other relaxation techniques.  Limit stress.  Sit up straight, and do not tense your muscles.  Do not use tobacco products, including cigarettes, chewing tobacco, or e-cigarettes. If you need help quitting, ask your health care provider.  Exercise regularly as told by your health care provider.  Sleep on a regular schedule. Get  7-9 hours of sleep, or the amount recommended by your health care provider. Contact a health care provider if:  Your symptoms are not helped by medicine.  You have a headache that is different from the usual headache.  You have nausea or you vomit.  You have a fever. Get help right away if:  Your headache becomes severe.  You have repeated vomiting.  You have a stiff neck.  You have a loss of vision.  You have problems with speech.  You have pain in the eye or ear.  You have muscular weakness or loss of muscle control.  You lose your balance or have trouble walking.  You feel faint or pass out.  You have confusion. This information is not intended to replace advice given to you by your health care provider. Make sure you discuss any questions you have with your health care provider. Document Released: 10/04/2005 Document Revised: 03/11/2016 Document Reviewed: 01/27/2015 Elsevier Interactive Patient Education  [employer].

## 2017-12-06 NOTE — Assessment & Plan Note (Signed)
Has hurt on knuckle of second finger since she injured it 3.5 weeks ago. Is improving. May use ice and Lidocaine gel prn

## 2017-12-06 NOTE — Assessment & Plan Note (Signed)
Tolerating statin, encouraged heart healthy diet, avoid trans fats, minimize simple carbs and saturated fats. Increase exercise as tolerated 

## 2017-12-19 ENCOUNTER — Ambulatory Visit
Admission: RE | Admit: 2017-12-19 | Discharge: 2017-12-19 | Disposition: A | Discharge status: Home / Self Care | Payer: Medicare Other | Source: Ambulatory Visit | Attending: Family Medicine | Admitting: Family Medicine

## 2017-12-19 DIAGNOSIS — Z1231 Encounter for screening mammogram for malignant neoplasm of breast: Secondary | ICD-10-CM | POA: Diagnosis not present

## 2017-12-20 ENCOUNTER — Telehealth: Payer: Self-pay | Admitting: Family Medicine

## 2017-12-20 NOTE — Telephone Encounter (Signed)
PA approved until 10/17/2018

## 2017-12-20 NOTE — Telephone Encounter (Signed)
Received PA form from [facility]. Form completed and faxed to [phone]. Awaiting determination.

## 2017-12-20 NOTE — Telephone Encounter (Signed)
Copied from [place] [id number]. Topic: Quick Communication - See Telephone Encounter >> Dec 20, 2017 11:33 AM Ahmed Prima L wrote: CRM for notification. See Telephone encounter for:   12/20/17.  [facility] called and needs a PA on atorvastatin (LIPITOR) 40 MG tablet, they need additional info. Call back [phone].

## 2017-12-26 ENCOUNTER — Telehealth: Payer: Self-pay | Admitting: Family Medicine

## 2017-12-26 NOTE — Telephone Encounter (Signed)
Pt states when she went to pick up her Rx this month they questioned why it was written for 1.5 pills. Then requested the doctor to answer these questions. Now she states she got a letter today that states [facility] has approved her for the Atorvastatin and now the dr does not need to fill out anything now. Please disregard letter pt dropped off regarding this matter.

## 2017-12-26 NOTE — Telephone Encounter (Signed)
Noted  

## 2017-12-26 NOTE — Telephone Encounter (Signed)
Copied from [place] [id number]. Topic: Quick Communication - See Telephone Encounter >> Dec 26, 2017  9:53 AM Rosalin Hawking wrote: CRM for notification. See Telephone encounter for:  12/26/17.   Pt dropped off a letter from [employer] wanting provider to explain additional info to [facility] about Atorvastatin calcium that pt is required to take, (letter 2 pages front and back with explanation of what they are needing). Pt stated that is needing process done within 5 days since pt is needing her rx.  Document put at front office tray under providers name.

## 2017-12-26 NOTE — Telephone Encounter (Signed)
This was forwarded to Dr. Charlett Blake today with notes about what was going on; she forwarded this over to [place]/SLS 03/11

## 2018-02-13 ENCOUNTER — Ambulatory Visit: Payer: Self-pay | Admitting: *Deleted

## 2018-02-13 NOTE — Telephone Encounter (Signed)
Patient denies any chest pain or shortness of breath. Denies any injury. Appointment made with Mackie Pai for tomorrow.Patient advised to apply  Ice to affected area and to off her feet as much as possible  Reason for Disposition . Swelling of face, arm or hands  (Exception: slight puffiness of fingers occurring during hot weather)  Answer Assessment - Initial Assessment Questions 1. ONSET: "When did the swelling start?" (e.g., minutes, hours, days)      40  mins   ago 2. LOCATION: "What part of the leg is swollen?"  "Are both legs swollen or just one leg?"    Below knee  Left  Leg  Outside    3. SEVERITY: "How bad is the swelling?" (e.g., localized; mild, moderate, severe)  - Localized - small area of swelling localized to one leg  - MILD pedal edema - swelling limited to foot and ankle, pitting edema < 1/4 inch (6 mm) deep, rest and elevation eliminate most or all swelling  - MODERATE edema - swelling of lower leg to knee, pitting edema > 1/4 inch (6 mm) deep, rest and elevation only partially reduce swelling  - SEVERE edema - swelling extends above knee, facial or hand swelling present     Localized   About    Size  Of  Ping pong ball    4. REDNESS: "Does the swelling look red or infected?"      Appears  As  A  Fresh  Bruise     5. PAIN: "Is the swelling painful to touch?" If so, ask: "How painful is it?"   (Scale 1-10; mild, moderate or severe)        4   6. FEVER: "Do you have a fever?" If so, ask: "What is it, how was it measured, and when did it start?"      no 7. CAUSE: "What do you think is causing the leg swelling?"     Was standing up cooking and felt pain in left leg and swelling she sat down and put Ice on it   8. MEDICAL HISTORY: "Do you have a history of heart failure, kidney disease, liver failure, or cancer?"        No 9. RECURRENT SYMPTOM: "Have you had leg swelling before?" If so, ask: "When was the last time?" "What happened that time?"       Never had anything like  this before  10. OTHER SYMPTOMS: "Do you have any other symptoms?" (e.g., chest pain, difficulty breathing)       No  Other symptoms  11. PREGNANCY: "Is there any chance you are pregnant?" "When was your last menstrual period?"       n/a  Protocols used: LEG SWELLING AND EDEMA-A-AH

## 2018-02-14 ENCOUNTER — Encounter: Payer: Self-pay | Admitting: Medical

## 2018-02-14 ENCOUNTER — Ambulatory Visit (HOSPITAL_BASED_OUTPATIENT_CLINIC_OR_DEPARTMENT_OTHER)
Admission: RE | Admit: 2018-02-14 | Discharge: 2018-02-14 | Disposition: A | Discharge status: Home / Self Care | Payer: Medicare Other | Source: Ambulatory Visit | Attending: Medical | Admitting: Medical

## 2018-02-14 ENCOUNTER — Ambulatory Visit (INDEPENDENT_AMBULATORY_CARE_PROVIDER_SITE_OTHER): Payer: Medicare Other | Admitting: Medical

## 2018-02-14 ENCOUNTER — Encounter (HOSPITAL_BASED_OUTPATIENT_CLINIC_OR_DEPARTMENT_OTHER): Payer: Self-pay

## 2018-02-14 VITALS — BP 137/65 | HR 59 | Temp 98.2°F | Resp 16 | Ht 62.0 in | Wt 176.8 lb

## 2018-02-14 DIAGNOSIS — S8012XA Contusion of left lower leg, initial encounter: Secondary | ICD-10-CM | POA: Diagnosis not present

## 2018-02-14 DIAGNOSIS — R2231 Localized swelling, mass and lump, right upper limb: Secondary | ICD-10-CM | POA: Insufficient documentation

## 2018-02-14 DIAGNOSIS — M79641 Pain in right hand: Secondary | ICD-10-CM

## 2018-02-14 DIAGNOSIS — M79609 Pain in unspecified limb: Secondary | ICD-10-CM

## 2018-02-14 DIAGNOSIS — T148XXA Other injury of unspecified body region, initial encounter: Secondary | ICD-10-CM

## 2018-02-14 DIAGNOSIS — N644 Mastodynia: Secondary | ICD-10-CM | POA: Diagnosis not present

## 2018-02-14 DIAGNOSIS — M19041 Primary osteoarthritis, right hand: Secondary | ICD-10-CM | POA: Diagnosis not present

## 2018-02-14 DIAGNOSIS — E119 Type 2 diabetes mellitus without complications: Secondary | ICD-10-CM | POA: Diagnosis not present

## 2018-02-14 DIAGNOSIS — M79604 Pain in right leg: Secondary | ICD-10-CM | POA: Insufficient documentation

## 2018-02-14 DIAGNOSIS — R519 Headache, unspecified: Secondary | ICD-10-CM

## 2018-02-14 DIAGNOSIS — R51 Headache: Secondary | ICD-10-CM

## 2018-02-14 LAB — CBC WITH DIFFERENTIAL/PLATELET
BASOS ABS: 0 10*3/uL (ref 0.0–0.1)
Basophils Relative: 0.7 % (ref 0.0–3.0)
Eosinophils Absolute: 0.1 10*3/uL (ref 0.0–0.7)
Eosinophils Relative: 1 % (ref 0.0–5.0)
HCT: 39.5 % (ref 36.0–46.0)
Hemoglobin: 13 g/dL (ref 12.0–15.0)
LYMPHS PCT: 49.7 % — AB (ref 12.0–46.0)
Lymphs Abs: 3.5 10*3/uL (ref 0.7–4.0)
MCHC: 33 g/dL (ref 30.0–36.0)
MCV: 93.2 fl (ref 78.0–100.0)
MONOS PCT: 9 % (ref 3.0–12.0)
Monocytes Absolute: 0.6 10*3/uL (ref 0.1–1.0)
NEUTROS ABS: 2.8 10*3/uL (ref 1.4–7.7)
Neutrophils Relative %: 39.6 % — ABNORMAL LOW (ref 43.0–77.0)
PLATELETS: 291 10*3/uL (ref 150.0–400.0)
RBC: 4.24 Mil/uL (ref 3.87–5.11)
RDW: 14.6 % (ref 11.5–15.5)
WBC: 7 10*3/uL (ref 4.0–10.5)

## 2018-02-14 LAB — HM DIABETES EYE EXAM

## 2018-02-14 LAB — SEDIMENTATION RATE: Sed Rate: 26 mm/hr (ref 0–30)

## 2018-02-14 MED ORDER — CEPHALEXIN 500 MG PO CAPS: 500.0000 mg | ORAL_CAPSULE | Freq: Two times a day (BID) | ORAL | 0 refills | Status: DC

## 2018-02-14 NOTE — Patient Instructions (Addendum)
For your left lower extremity pain with pain behind the knee, I did get you scheduled for left lower extremity ultrasound today at 1:30.  You can also get right hand x-ray at that time as well.  Presently you can apply warm compress twice daily to your left anterior calf region.  And take low-dose ibuprofen 200 mg every 8 hours for the next couple of days.  For your faint left breast pain with minimal bruised/pink appearance in the lower region, we will get a CBC.  Evaluate the platelets since you also have bruised region in the left calf area also.  However if you feel that the pink area is worsening or becoming more tender then can start Keflex antibiotic.  Your mammogram was negative in March.  However we need to make sure that your breast skin coloration does not change permanently and that you do not have any abnormal persisting pain.  Follow-up in 2 weeks or as needed.

## 2018-02-14 NOTE — Progress Notes (Addendum)
Subjective:    Patient ID: Heather Roberts, female    DOB: 26-Apr-1946, 72 y.o.   MRN: [id number]  HPI  Pt in for bruise and tenderness to left anterior tibial. No injury. Also some popliteal pain as well. 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  Noticed bruise to lower ext and breast this morning.  Also some mild bruise new on her left breast just came up today. Screening mammogram was negative in march.  No shortness of breath. She denies any trauma to to lower extremity or breast.    Review of Systems  Constitutional: Negative for chills and fatigue.  Respiratory: Negative for cough, choking, chest tightness, shortness of breath and wheezing.   Musculoskeletal: Negative for back pain.       Hand pain. Some pain for years. Mild daily pain. Worse when she cooks and uses her rt hand.  Skin: Negative for rash.       See hpi.  Neurological: Negative for headaches.       Brief ha yesterday but now none. Had ha on let side. Also had some left side neck pain briefly.   Some termpral ha distribution briefly yesterday. So did sed rate.   Hematological: Negative for adenopathy. Does not bruise/bleed easily.  Psychiatric/Behavioral: Negative for behavioral problems and confusion.   Past Medical History:  Diagnosis Date  . Allergic rhinitis   . Anemia 03/19/2013  . Anxiety   . Bouchard nodes (DJD hand)   . Carotid artery disease ([place]) 03/24/2014  . Cervical cancer screening 07/31/2015  . Dense breast tissue 03/03/2017  . Diabetes mellitus type II   . DJD (degenerative joint disease), lumbar   . Fibroids   . Glaucoma   . H/O diverticulitis of colon 03/03/2017  . History of DVT (deep vein thrombosis) 1994  . Hyperlipidemia   . Hypertension   . Insomnia 02/03/2016  . Nocturia 12/18/2012  . Obesity 07/31/2015  . Palpitations 12/18/2012     Social History   Socioeconomic History  . Marital status:  Married    Spouse name: Essick  . Number of children: 3  . Years of education: Not on file  . Highest education level: Not on file  Occupational History  . Occupation: Scientist, research (physical sciences): RETIRED    Comment: used to work for [employer]  . Financial resource strain: Not on file  . Food insecurity:    Worry: Not on file    Inability: Not on file  . Transportation needs:    Medical: Not on file    Non-medical: Not on file  Tobacco Use  . Smoking status: Former Smoker    Packs/day: 0.30    Years: 8.00    Pack years: 2.40    Types: Cigarettes  . Smokeless tobacco: Never Used  Substance and Sexual Activity  . Alcohol use: Yes    Alcohol/week: 0.0 oz    Comment: Rarely---glass of wine with a meeting  . Drug use: No  . Sexual activity: Yes    Partners: Male    Comment: lives with husband, no dietary restrictions, minimizes dairy is walking more now  Lifestyle  . Physical activity:    Days per week: Not on file    Minutes per session: Not on file  . Stress: Not on file  Relationships  . Social connections:    Talks on  phone: Not on file    Gets together: Not on file    Attends religious service: Not on file    Active member of club or organization: Not on file    Attends meetings of clubs or organizations: Not on file    Relationship status: Not on file  . Intimate partner violence:    Fear of current or ex partner: Not on file    Emotionally abused: Not on file    Physically abused: Not on file    Forced sexual activity: Not on file  Other Topics Concern  . 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 ABDOMINAL HYSTERECTOMY    . CHOLECYSTECTOMY    . EYE SURGERY  09/2014, 08/2014   Cataracts removal  . KNEE ARTHROSCOPY      Family History   Problem Relation Age of Onset  . Diabetes Mother   . Kidney disease Mother   . COPD Father        smoker  . Stroke Sister   . Breast cancer Sister        s/p mastectomy  . COPD Brother   . Stroke Brother   . Stroke Unknown   . Diabetes Paternal Grandmother     Allergies  Allergen Reactions  . Tussionex Pennkinetic Er [Hydrocod Polst-Cpm Polst Er] Other (See Comments)    Upset stomach and loose stool     Current Outpatient Medications on File Prior to Visit  Medication Sig Dispense Refill  . amLODipine (NORVASC) 10 MG tablet Take 1 tablet (10 mg total) by mouth daily. 90 tablet 1  . aspirin 81 MG tablet Take 81 mg by mouth daily.      Marland Kitchen atorvastatin (LIPITOR) 40 MG tablet TAKE 1 AND 1/2 TABLETS BY MOUTH DAILY 135 tablet 3  . Cholecalciferol (VITAMIN D) 1000 UNITS capsule Take 1,000 Units by mouth. ocassionally    . Cyanocobalamin (B-12) 1000 MCG CAPS Take 1 tablet by mouth daily.    . fluticasone (FLONASE) 50 MCG/ACT nasal spray Place 2 sprays into both nostrils daily as needed for allergies or rhinitis. 16 g 6  . glucose blood (GLUCOSE METER TEST) test strip To use with onetouch verio flex DX E11.9 100 each 6  . glucose blood (ONETOUCH VERIO) test strip Check blood sugar once daily.  DX E11.9 100 each 6  . hydrochlorothiazide (HYDRODIURIL) 12.5 MG tablet Take 1 tablet (12.5 mg total) by mouth daily. 30 tablet 5  . losartan (COZAAR) 100 MG tablet Take 1 tablet (100 mg total) by mouth daily. 30 tablet 3  . metFORMIN (GLUCOPHAGE) 500 MG tablet Take 1 tablet (500 mg total) by mouth 2 (two) times daily with a meal. 180 tablet 3  . mometasone (NASONEX) 50 MCG/ACT nasal spray Place 2 sprays into the nose daily.    . nitroGLYCERIN (NITROSTAT) 0.4 MG SL tablet Place 0.4 mg under the tongue every 5 (five) minutes as needed for chest pain.    Marland Kitchen tiZANidine (ZANAFLEX) 2 MG tablet TAKE 1 TABLET(2 MG) BY MOUTH AT BEDTIME AS NEEDED FOR MUSCLE SPASMS 30 tablet 1   No current  facility-administered medications on file prior to visit.     BP  137/65   Pulse (!) 59   Temp 98.2 F (36.8 C) (Oral)   Resp 16   Ht 5\' 2"  (1.575 m)   Wt 176 lb 12.8 oz (80.2 kg)   SpO2 100%   BMI 32.34 kg/m       Objective:   Physical Exam  General Mental Status- Alert. General Appearance- Not in acute distress.   Skin General: Color- Normal Color. Moisture- Normal Moisture.  Neck Carotid Arteries- Normal color. Moisture- Normal Moisture. No carotid bruits. No JVD.  Chest and Lung Exam Auscultation: Breath Sounds:-Normal.  Cardiovascular Auscultation:Rythm- Regular. Murmurs & Other Heart Sounds:Auscultation of the heart reveals- No Murmurs.  Abdomen Inspection:-Inspeection Normal. Palpation/Percussion:Note:No mass. Palpation and Percussion of the abdomen reveal- Non Tender, Non Distended + BS, no rebound or guarding.    Neurologic Cranial Nerve exam:- CN III-XII intact(No nystagmus), symmetric smile. Strength:- 5/5 equal and symmetric strength both upper and lower extremities.  Left breast- lower portion of breast has faint pinkish apppearance with faint bruised appearance. Mild tender to palpation. No mass.(Exam done with Mel Almond as chaperone)  Left lower ext- faint positive homan sign. Lower pretibial area 3 cm faint bruised area. Mild swollen. Possible faint swollen superficial vein.     Assessment & Plan:  For your left lower extremity pain with pain behind the knee, I did get you scheduled for left lower extremity ultrasound today at 1:30.  You can also get right hand x-ray at that time as well.  Presently you can apply warm compress twice daily to your left anterior calf region.  And take low-dose ibuprofen 200 mg every 8 hours for the next couple of days.  For your faint left breast pain with minimal bruised/pink appearance in the lower region, we will get a CBC.  Evaluate the platelets since you also have bruised region in the left calf area also.   However if you feel that the pink area is worsening or becoming more tender then can start Keflex antibiotic.  Your mammogram was negative in March.  However we need to make sure that your breast skin coloration does not change permanently and that you do not have any abnormal persisting pain.  Follow-up in 2 weeks or as needed.  Mackie Pai, PA-C

## 2018-02-17 ENCOUNTER — Encounter: Payer: Self-pay | Admitting: Family Medicine

## 2018-02-23 ENCOUNTER — Encounter: Payer: Self-pay | Admitting: Cardiology

## 2018-03-08 NOTE — Progress Notes (Signed)
HPI: FU cerebrovascular disease, CP and palpitations. Stress echocardiogram in April of 2014 showed normal wall motion. There was a hypertensive response. Event monitor in April of 2014 showed sinus rhythm. CTA in March of 2014 showed no pulmonary embolus. TSH normal. Carotid Dopplers in March 2018 showed less than 50% stenosis bilaterally.  Since I last saw her, the patient has dyspnea with more extreme activities but not with routine activities. It is relieved with rest. It is not associated with chest pain. There is no orthopnea, PND or pedal edema. There is no syncope or palpitations. There is no exertional chest pain.   Current Outpatient Medications  Medication Sig Dispense Refill  . amLODipine (NORVASC) 10 MG tablet Take 1 tablet (10 mg total) by mouth daily. 90 tablet 1  . aspirin 81 MG tablet Take 81 mg by mouth daily.      Marland Kitchen atorvastatin (LIPITOR) 40 MG tablet TAKE 1 AND 1/2 TABLETS BY MOUTH DAILY 135 tablet 3  . Cyanocobalamin (B-12) 1000 MCG CAPS Take 1 tablet by mouth daily.    . fluticasone (FLONASE) 50 MCG/ACT nasal spray Place 2 sprays into both nostrils daily as needed for allergies or rhinitis. 16 g 6  . glucose blood (GLUCOSE METER TEST) test strip To use with onetouch verio flex DX E11.9 100 each 6  . glucose blood (ONETOUCH VERIO) test strip Check blood sugar once daily.  DX E11.9 100 each 6  . hydrochlorothiazide (HYDRODIURIL) 12.5 MG tablet Take 1 tablet (12.5 mg total) by mouth daily. 30 tablet 5  . losartan (COZAAR) 100 MG tablet Take 1 tablet (100 mg total) by mouth daily. 30 tablet 3  . metFORMIN (GLUCOPHAGE) 500 MG tablet Take 1 tablet (500 mg total) by mouth 2 (two) times daily with a meal. 180 tablet 3  . mometasone (NASONEX) 50 MCG/ACT nasal spray Place 2 sprays into the nose daily.    . nitroGLYCERIN (NITROSTAT) 0.4 MG SL tablet Place 0.4 mg under the tongue every 5 (five) minutes as needed for chest pain.    Marland Kitchen tiZANidine (ZANAFLEX) 2 MG tablet TAKE 1  TABLET(2 MG) BY MOUTH AT BEDTIME AS NEEDED FOR MUSCLE SPASMS 30 tablet 1   No current facility-administered medications for this visit.      Past Medical History:  Diagnosis Date  . Allergic rhinitis   . Anemia 03/19/2013  . Anxiety   . Bouchard nodes (DJD hand)   . Carotid artery disease ([place]) 03/24/2014  . Cervical cancer screening 07/31/2015  . Dense breast tissue 03/03/2017  . Diabetes mellitus type II   . DJD (degenerative joint disease), lumbar   . Fibroids   . Glaucoma   . H/O diverticulitis of colon 03/03/2017  . History of DVT (deep vein thrombosis) 1994  . Hyperlipidemia   . Hypertension   . Insomnia 02/03/2016  . Nocturia 12/18/2012  . Obesity 07/31/2015  . Palpitations 12/18/2012    Past Surgical History:  Procedure Laterality Date  . ABDOMINAL HYSTERECTOMY    . CHOLECYSTECTOMY    . EYE SURGERY  09/2014, 08/2014   Cataracts removal  . KNEE ARTHROSCOPY Bilateral    Left 2007, Right 1999    Social History   Socioeconomic History  . Marital status: Married    Spouse name: Essick  . Number of children: 3  . Years of education: Not on file  . Highest education level: Not on file  Occupational History  . Occupation: Scientist, research (physical sciences): RETIRED  Comment: used to work for [employer]  . Financial resource strain: Not on file  . Food insecurity:    Worry: Not on file    Inability: Not on file  . Transportation needs:    Medical: Not on file    Non-medical: Not on file  Tobacco Use  . Smoking status: Former Smoker    Packs/day: 0.30    Years: 8.00    Pack years: 2.40    Types: Cigarettes  . Smokeless tobacco: Never Used  Substance and Sexual Activity  . Alcohol use: Yes    Alcohol/week: 0.0 oz    Comment: Rarely---glass of wine with a meeting  . Drug use: No  . Sexual activity: Yes    Partners: Male    Comment: lives with husband, no dietary restrictions, minimizes dairy is walking more now  Lifestyle  . Physical  activity:    Days per week: Not on file    Minutes per session: Not on file  . Stress: Not on file  Relationships  . Social connections:    Talks on phone: Not on file    Gets together: Not on file    Attends religious service: Not on file    Active member of club or organization: Not on file    Attends meetings of clubs or organizations: Not on file    Relationship status: Not on file  . Intimate partner violence:    Fear of current or ex partner: Not on file    Emotionally abused: Not on file    Physically abused: Not on file    Forced sexual activity: Not on file  Other Topics Concern  . 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 Diabetes Mother   . Kidney disease Mother   . COPD Father        smoker  . Stroke Sister   . Breast cancer Sister        s/p mastectomy  . COPD Brother   . Stroke Brother   . Stroke Unknown   . Diabetes Paternal Grandmother     ROS: no fevers or chills, productive cough, hemoptysis, dysphasia, odynophagia, melena, hematochezia, dysuria, hematuria, rash, seizure activity, orthopnea, PND, pedal edema, claudication. Remaining systems are negative.  Physical Exam: Well-developed well-nourished in no acute distress.  Skin is warm and dry.  HEENT is normal.  Neck is supple.  Chest is clear to auscultation with normal expansion.  Cardiovascular exam is regular rate and rhythm.  Abdominal exam nontender or distended. No masses palpated. Extremities show no edema. neuro grossly intact  ECG-normal sinus rhythm at a rate of 76.  No ST changes.  Personally reviewed  A/P  1 carotid artery disease-mild on most recent carotid Dopplers.  Continue aspirin and statin.  2 hyperlipidemia-continue statin.  Lipids and liver monitored by primary care.  3  hypertension-blood pressure is elevated.  Add Toprol 25 mg daily.  Follow blood pressure and adjust regimen as needed.  4 palpitations-no recent symptoms.  I have added beta-blockade for blood pressure which should help if future palpitations.  Kirk Ruths, MD

## 2018-03-15 ENCOUNTER — Ambulatory Visit (INDEPENDENT_AMBULATORY_CARE_PROVIDER_SITE_OTHER): Payer: Medicare Other | Admitting: Cardiology

## 2018-03-15 ENCOUNTER — Other Ambulatory Visit: Payer: Self-pay | Admitting: Family Medicine

## 2018-03-15 ENCOUNTER — Encounter: Payer: Self-pay | Admitting: Cardiology

## 2018-03-15 VITALS — BP 154/82 | HR 74 | Ht 62.0 in | Wt 178.1 lb

## 2018-03-15 DIAGNOSIS — I10 Essential (primary) hypertension: Secondary | ICD-10-CM

## 2018-03-15 DIAGNOSIS — R002 Palpitations: Secondary | ICD-10-CM

## 2018-03-15 DIAGNOSIS — E7800 Pure hypercholesterolemia, unspecified: Secondary | ICD-10-CM

## 2018-03-15 MED ORDER — METOPROLOL SUCCINATE ER 25 MG PO TB24: 25.0000 mg | ORAL_TABLET | Freq: Every day | ORAL | 3 refills | Status: DC

## 2018-03-15 NOTE — Patient Instructions (Signed)
Medication Instructions:   START METOPROLOL 25 MG ONCE DAILY  Follow-Up:  Your physician recommends that you schedule a follow-up appointment in: AS NEEDED   If you need a refill on your cardiac medications before your next appointment, please call your pharmacy.

## 2018-03-29 ENCOUNTER — Telehealth: Payer: Self-pay | Admitting: Cardiology

## 2018-03-29 NOTE — Telephone Encounter (Signed)
Metoprolol prescribed by Dr Pennie Banter to treat blood pressure and palpitations.  Recommendation:  Cut dose back to 12.5mg  daily and take immediatly after full meal to improve symptoms. If not significant relieve noted in 1 week, please call back for further assessment.

## 2018-03-29 NOTE — Telephone Encounter (Signed)
Advised patient, verbalized understanding  

## 2018-03-29 NOTE — Telephone Encounter (Signed)
Spoke with pt who states ever since she was started on the metoprolol on 5/29 she has been experiencing dry mouth, feeling dehydration, and like she is having indigestion. She states she has never had this feeling before and was wondering if it's related to the medication. Routing to Pharm D and Dr. Stanford Breed for recommendation.

## 2018-03-29 NOTE — Telephone Encounter (Signed)
New Message    Pt c/o medication issue:  1. Name of Medication: metoprolol succinate (TOPROL XL) 25 MG 24 hr tablet  2. How are you currently taking this medication (dosage and times per day)? One tablet once a day   3. Are you having a reaction (difficulty breathing--STAT)? Dry mouth  4. What is your medication issue? Patient states that since she started this medication she has been experiencing dry mouth and feelings of dehydration,. Please call to discuss.

## 2018-04-07 ENCOUNTER — Telehealth: Payer: Self-pay | Admitting: Cardiology

## 2018-04-07 ENCOUNTER — Encounter: Payer: Self-pay | Admitting: Family Medicine

## 2018-04-07 ENCOUNTER — Ambulatory Visit (INDEPENDENT_AMBULATORY_CARE_PROVIDER_SITE_OTHER): Payer: Medicare Other | Admitting: Family Medicine

## 2018-04-07 DIAGNOSIS — I10 Essential (primary) hypertension: Secondary | ICD-10-CM | POA: Diagnosis not present

## 2018-04-07 DIAGNOSIS — E785 Hyperlipidemia, unspecified: Secondary | ICD-10-CM

## 2018-04-07 DIAGNOSIS — R002 Palpitations: Secondary | ICD-10-CM

## 2018-04-07 DIAGNOSIS — E669 Obesity, unspecified: Secondary | ICD-10-CM

## 2018-04-07 DIAGNOSIS — E1169 Type 2 diabetes mellitus with other specified complication: Secondary | ICD-10-CM | POA: Diagnosis not present

## 2018-04-07 NOTE — Assessment & Plan Note (Addendum)
Well controlled, no changes to meds. Encouraged heart healthy diet such as the DASH diet and exercise as tolerated. She is concerned that the low dose of metoprolol is dehydrating her and causing fatigue. If symptoms persist she will follow up with cardiology and/or reach out to [place] again

## 2018-04-07 NOTE — Patient Instructions (Signed)

## 2018-04-07 NOTE — Assessment & Plan Note (Signed)
hgba1c acceptable, minimize simple carbs. Increase exercise as tolerated. Continue current meds 

## 2018-04-07 NOTE — Telephone Encounter (Signed)
Spoke with pt who reports she is still having symptoms of feeling dehydrated, tire and dry mouth. She states its to the point that it wakes her up out of her sleep. On 6/12 she was recommended to decrease the metoprolol to 12.5 mg. She reports it has helped with her palpations but would like to address her symptoms. Routing to Dr. Stanford Breed and Pharm D.

## 2018-04-07 NOTE — Telephone Encounter (Signed)
Doubt metoprolol causing dry mouth but if she insists, can DC; palpitations may worsen; follow BP Kirk Ruths

## 2018-04-07 NOTE — Progress Notes (Signed)
Subjective:  I acted as a Education administrator for Dr. Charlett Blake. [place], [place]  Patient ID: Heather Roberts, female    DOB: 15-Nov-1945, 72 y.o.   MRN: [id number]  Chief Complaint  Patient presents with  . Follow-up    HPI  Patient is in today for a 4 month follow up and overall she feels well. She is concerned that the low dose of metoprolol is dehydrating her and causing fatigue. She also endorses polyuria but denies dysuria or hematuria. No recent febrile illness or hospitalizations. Denies CP/palp/SOB/HA/congestion/fevers/GI or GU c/o. Taking meds as prescribed. Her fasting blood sugar this morning was 129 which is a fairly common number.   Patient Care Team: Mosie Lukes, MD as PCP - General (Family Medicine) Stanford Breed Denice Bors, MD as Consulting Physician (Cardiology)   Past Medical History:  Diagnosis Date  . Allergic rhinitis   . Anemia 03/19/2013  . Anxiety   . Bouchard nodes (DJD hand)   . Carotid artery disease ([place]) 03/24/2014  . Cervical cancer screening 07/31/2015  . Dense breast tissue 03/03/2017  . Diabetes mellitus type II   . DJD (degenerative joint disease), lumbar   . Fibroids   . Glaucoma   . H/O diverticulitis of colon 03/03/2017  . History of DVT (deep vein thrombosis) 1994  . Hyperlipidemia   . Hypertension   . Insomnia 02/03/2016  . Nocturia 12/18/2012  . Obesity 07/31/2015  . Palpitations 12/18/2012    Past Surgical History:  Procedure Laterality Date  . ABDOMINAL HYSTERECTOMY    . CHOLECYSTECTOMY    . EYE SURGERY  09/2014, 08/2014   Cataracts removal  . KNEE ARTHROSCOPY Bilateral    Left 2007, Right 1999    Family History  Problem Relation Age of Onset  . Diabetes Mother   . Kidney disease Mother   . COPD Father        smoker  . Stroke Sister   . Breast cancer Sister        s/p mastectomy  . COPD Brother   . Stroke Brother   . Stroke Unknown   . Diabetes Paternal Grandmother     Social History   Socioeconomic History  . Marital status:  Married    Spouse name: Essick  . Number of children: 3  . Years of education: Not on file  . Highest education level: Not on file  Occupational History  . Occupation: Scientist, research (physical sciences): RETIRED    Comment: used to work for [employer]  . Financial resource strain: Not on file  . Food insecurity:    Worry: Not on file    Inability: Not on file  . Transportation needs:    Medical: Not on file    Non-medical: Not on file  Tobacco Use  . Smoking status: Former Smoker    Packs/day: 0.30    Years: 8.00    Pack years: 2.40    Types: Cigarettes  . Smokeless tobacco: Never Used  Substance and Sexual Activity  . Alcohol use: Yes    Alcohol/week: 0.0 oz    Comment: Rarely---glass of wine with a meeting  . Drug use: No  . Sexual activity: Yes    Partners: Male    Comment: lives with husband, no dietary restrictions, minimizes dairy is walking more now  Lifestyle  . Physical activity:    Days per week: Not on file    Minutes per session: Not on file  . Stress: Not on  file  Relationships  . Social connections:    Talks on phone: Not on file    Gets together: Not on file    Attends religious service: Not on file    Active member of club or organization: Not on file    Attends meetings of clubs or organizations: Not on file    Relationship status: Not on file  . Intimate partner violence:    Fear of current or ex partner: Not on file    Emotionally abused: Not on file    Physically abused: Not on file    Forced sexual activity: Not on file  Other Topics Concern  . 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 amLODipine (NORVASC) 10 MG tablet TAKE 1 TABLET(10 MG) BY MOUTH DAILY 90 tablet 0  . atorvastatin (LIPITOR) 40 MG tablet  TAKE 1 AND 1/2 TABLETS BY MOUTH DAILY 135 tablet 3  . Cyanocobalamin (B-12) 1000 MCG CAPS Take 1 tablet by mouth daily.    . fluticasone (FLONASE) 50 MCG/ACT nasal spray Place 2 sprays into both nostrils daily as needed for allergies or rhinitis. 16 g 6  . glucose blood (GLUCOSE METER TEST) test strip To use with onetouch verio flex DX E11.9 100 each 6  . glucose blood (ONETOUCH VERIO) test strip Check blood sugar once daily.  DX E11.9 100 each 6  . hydrochlorothiazide (HYDRODIURIL) 12.5 MG tablet Take 1 tablet (12.5 mg total) by mouth daily. 30 tablet 5  . losartan (COZAAR) 100 MG tablet Take 1 tablet (100 mg total) by mouth daily. 30 tablet 3  . metFORMIN (GLUCOPHAGE) 500 MG tablet Take 1 tablet (500 mg total) by mouth 2 (two) times daily with a meal. 180 tablet 3  . metoprolol succinate (TOPROL-XL) 25 MG 24 hr tablet Take 25 mg by mouth as directed. 1/2 tablet daily with dinner    . mometasone (NASONEX) 50 MCG/ACT nasal spray Place 2 sprays into the nose daily.    . nitroGLYCERIN (NITROSTAT) 0.4 MG SL tablet Place 0.4 mg under the tongue every 5 (five) minutes as needed for chest pain.    Marland Kitchen tiZANidine (ZANAFLEX) 2 MG tablet TAKE 1 TABLET(2 MG) BY MOUTH AT BEDTIME AS NEEDED FOR MUSCLE SPASMS 30 tablet 1  . aspirin 81 MG tablet Take 81 mg by mouth daily.       No facility-administered medications prior to visit.     Allergies  Allergen Reactions  . Tussionex Pennkinetic Er [Hydrocod Polst-Cpm Polst Er] Other (See Comments)    Upset stomach and loose stool     Review of Systems  Constitutional: Positive for malaise/fatigue. Negative for fever.  HENT: Negative for congestion.   Eyes: Negative for blurred vision.  Respiratory: Negative for shortness of breath.   Cardiovascular: Negative for chest pain, palpitations and leg swelling.  Gastrointestinal: Negative for abdominal pain, blood in stool and nausea.  Genitourinary: Positive for frequency. Negative for dysuria.  Musculoskeletal:  Negative for falls.  Skin: Negative for rash.  Neurological: Negative for dizziness, loss of consciousness and headaches.  Endo/Heme/Allergies: Negative for environmental allergies.  Psychiatric/Behavioral: Negative for depression. The patient is not nervous/anxious.  Objective:    Physical Exam  Constitutional: She is oriented to person, place, and time. She appears well-developed and well-nourished. No distress.  HENT:  Head: Normocephalic and atraumatic.  Nose: Nose normal.  Eyes: Right eye exhibits no discharge. Left eye exhibits no discharge.  Neck: Normal range of motion. Neck supple.  Cardiovascular: Normal rate and regular rhythm.  No murmur heard. Pulmonary/Chest: Effort normal and breath sounds normal.  Abdominal: Soft. Bowel sounds are normal. There is no tenderness.  Musculoskeletal: She exhibits no edema.  Neurological: She is alert and oriented to person, place, and time.  Skin: Skin is warm and dry.  Psychiatric: She has a normal mood and affect.  Nursing note and vitals reviewed.   BP 122/60 (BP Location: Left Arm, Patient Position: Sitting, Cuff Size: Normal)   Pulse 60   Temp 98.2 F (36.8 C) (Oral)   Resp 18   Ht 5\' 2"  (1.575 m)   Wt 178 lb 3.2 oz (80.8 kg)   SpO2 97%   BMI 32.59 kg/m  Wt Readings from Last 3 Encounters:  04/07/18 178 lb 3.2 oz (80.8 kg)  03/15/18 178 lb 1.9 oz (80.8 kg)  02/14/18 176 lb 12.8 oz (80.2 kg)   BP Readings from Last 3 Encounters:  04/07/18 122/60  03/15/18 (!) 154/82  02/14/18 137/65     Immunization History  Administered Date(s) Administered  . Influenza Split 06/23/2012  . Influenza Whole 08/09/2008, 07/31/2009, 06/19/2010  . Influenza, High Dose Seasonal PF 07/08/2016, 07/27/2017  . Influenza,inj,Quad PF,6+ Mos 06/25/2013, 07/22/2014, 07/31/2015  . Pneumococcal Conjugate-13 02/03/2016  . Pneumococcal Polysaccharide-23 08/06/2011  . Td 12/16/2004, 09/02/2016  . Zoster 01/03/2007    Health Maintenance   Topic Date Due  . FOOT EXAM  12/01/2016  . INFLUENZA VACCINE  05/18/2018  . HEMOGLOBIN A1C  05/31/2018  . Fecal DNA (Cologuard)  02/09/2019  . OPHTHALMOLOGY EXAM  02/15/2019  . MAMMOGRAM  12/20/2019  . TETANUS/TDAP  09/02/2026  . DEXA SCAN  Completed  . Hepatitis C Screening  Completed  . PNA vac Low Risk Adult  Completed    Lab Results  Component Value Date   WBC 7.0 02/14/2018   HGB 13.0 02/14/2018   HCT 39.5 02/14/2018   PLT 291.0 02/14/2018   GLUCOSE 183 (H) 12/01/2017   CHOL 137 12/01/2017   TRIG 92.0 12/01/2017   HDL 55.90 12/01/2017   LDLDIRECT 118.5 10/04/2007   LDLCALC 62 12/01/2017   ALT 17 12/01/2017   AST 23 12/01/2017   NA 135 12/01/2017   K 4.0 12/01/2017   CL 100 12/01/2017   CREATININE 1.04 12/01/2017   BUN 16 12/01/2017   CO2 28 12/01/2017   TSH 1.31 12/01/2017   HGBA1C 7.1 (H) 12/01/2017   MICROALBUR 0.50 06/14/2012    Lab Results  Component Value Date   TSH 1.31 12/01/2017   Lab Results  Component Value Date   WBC 7.0 02/14/2018   HGB 13.0 02/14/2018   HCT 39.5 02/14/2018   MCV 93.2 02/14/2018   PLT 291.0 02/14/2018   Lab Results  Component Value Date   NA 135 12/01/2017   K 4.0 12/01/2017   CO2 28 12/01/2017   GLUCOSE 183 (H) 12/01/2017   BUN 16 12/01/2017   CREATININE 1.04 12/01/2017   BILITOT 0.8 12/01/2017   ALKPHOS 116 12/01/2017   AST 23 12/01/2017   ALT 17 12/01/2017   PROT 7.8 12/01/2017   ALBUMIN 4.2 12/01/2017   CALCIUM 9.6 12/01/2017   GFR 67.09 12/01/2017  Lab Results  Component Value Date   CHOL 137 12/01/2017   Lab Results  Component Value Date   HDL 55.90 12/01/2017   Lab Results  Component Value Date   LDLCALC 62 12/01/2017   Lab Results  Component Value Date   TRIG 92.0 12/01/2017   Lab Results  Component Value Date   CHOLHDL 2 12/01/2017   Lab Results  Component Value Date   HGBA1C 7.1 (H) 12/01/2017         Assessment & Plan:   Problem List Items Addressed This Visit    Diabetes  mellitus type 2 in obese ([place])    hgba1c acceptable, minimize simple carbs. Increase exercise as tolerated. Continue current meds      Relevant Orders   Hemoglobin A1c   Hyperlipidemia associated with type 2 diabetes mellitus ([place])    Encouraged heart healthy diet, increase exercise, avoid trans fats, consider a krill oil cap daily      Relevant Orders   Lipid panel   Essential hypertension    Well controlled, no changes to meds. Encouraged heart healthy diet such as the DASH diet and exercise as tolerated. She is concerned that the low dose of metoprolol is dehydrating her and causing fatigue. If symptoms persist she will follow up with cardiology and/or reach out to [place] again      Relevant Orders   CBC   Comprehensive metabolic panel   TSH   Palpitations    Has not had any palpitations since starting the Metoprolol XL 25 mg tabs 1/2 tab daily, encouraged to discuss with cardiology          I am having Heather Roberts. Garfield Cornea maintain her aspirin, fluticasone, B-12, glucose blood, glucose blood, nitroGLYCERIN, mometasone, tiZANidine, atorvastatin, metFORMIN, hydrochlorothiazide, losartan, amLODipine, and metoprolol succinate.  No orders of the defined types were placed in this encounter.   CMA served as Education administrator during this visit. History, Physical and Plan performed by medical provider. Documentation and orders reviewed and attested to.  [name], MD

## 2018-04-07 NOTE — Telephone Encounter (Signed)
Pt c/o medication issue:  1. Name of Medication: Metoprolol  2. How are you currently taking this medication (dosage and times per day)? 1 time a day after her meal  3. Are you having a reaction (difficulty breathing--STAT)? no  4. What is your medication issue? Wake up dry in throat dehydrated and no energy

## 2018-04-07 NOTE — Assessment & Plan Note (Signed)
Encouraged heart healthy diet, increase exercise, avoid trans fats, consider a krill oil cap daily 

## 2018-04-07 NOTE — Assessment & Plan Note (Signed)
Has not had any palpitations since starting the Metoprolol XL 25 mg tabs 1/2 tab daily, encouraged to discuss with cardiology

## 2018-04-07 NOTE — Telephone Encounter (Signed)
Returned call to patient with MD advice and recommendation. She states she did not fee like her palpitations were that bad and reports her BP was good at her PCP office. Explained that metoprolol helps with her palps and BP. Explained that she will need to monitor for worsening palps and routinely track BP if she decided to stop beta-blocker.

## 2018-04-10 ENCOUNTER — Other Ambulatory Visit: Payer: Self-pay

## 2018-04-10 MED ORDER — LOSARTAN POTASSIUM 100 MG PO TABS: 100.0000 mg | ORAL_TABLET | Freq: Every day | ORAL | 1 refills | Status: AC

## 2018-04-11 ENCOUNTER — Other Ambulatory Visit (INDEPENDENT_AMBULATORY_CARE_PROVIDER_SITE_OTHER): Payer: Medicare Other

## 2018-04-11 DIAGNOSIS — E1169 Type 2 diabetes mellitus with other specified complication: Secondary | ICD-10-CM | POA: Diagnosis not present

## 2018-04-11 DIAGNOSIS — E785 Hyperlipidemia, unspecified: Secondary | ICD-10-CM | POA: Diagnosis not present

## 2018-04-11 DIAGNOSIS — E669 Obesity, unspecified: Secondary | ICD-10-CM | POA: Diagnosis not present

## 2018-04-11 DIAGNOSIS — I10 Essential (primary) hypertension: Secondary | ICD-10-CM | POA: Diagnosis not present

## 2018-04-11 LAB — CBC
HCT: 37.9 % (ref 36.0–46.0)
Hemoglobin: 12.7 g/dL (ref 12.0–15.0)
MCHC: 33.6 g/dL (ref 30.0–36.0)
MCV: 93 fl (ref 78.0–100.0)
PLATELETS: 271 10*3/uL (ref 150.0–400.0)
RBC: 4.08 Mil/uL (ref 3.87–5.11)
RDW: 14.5 % (ref 11.5–15.5)
WBC: 5.4 10*3/uL (ref 4.0–10.5)

## 2018-04-11 LAB — COMPREHENSIVE METABOLIC PANEL
ALK PHOS: 110 U/L (ref 39–117)
ALT: 19 U/L (ref 0–35)
AST: 23 U/L (ref 0–37)
Albumin: 4.2 g/dL (ref 3.5–5.2)
BILIRUBIN TOTAL: 0.6 mg/dL (ref 0.2–1.2)
BUN: 14 mg/dL (ref 6–23)
CALCIUM: 9.6 mg/dL (ref 8.4–10.5)
CO2: 28 meq/L (ref 19–32)
Chloride: 101 mEq/L (ref 96–112)
Creatinine, Ser: 0.88 mg/dL (ref 0.40–1.20)
GFR: 81.28 mL/min (ref >60.00)
GLUCOSE: 139 mg/dL — AB (ref 70–99)
Potassium: 4.1 mEq/L (ref 3.5–5.1)
Sodium: 137 mEq/L (ref 135–145)
Total Protein: 7.4 g/dL (ref 6.0–8.3)

## 2018-04-11 LAB — LIPID PANEL
CHOL/HDL RATIO: 3
Cholesterol: 154 mg/dL (ref 0–200)
HDL: 60.1 mg/dL (ref >39.00)
LDL Cholesterol: 71 mg/dL (ref 0–99)
NONHDL: 93.94
Triglycerides: 117 mg/dL (ref 0.0–149.0)
VLDL: 23.4 mg/dL (ref 0.0–40.0)

## 2018-04-11 LAB — HEMOGLOBIN A1C: Hgb A1c MFr Bld: 7 % — ABNORMAL HIGH (ref 4.6–6.5)

## 2018-04-11 LAB — TSH: TSH: 1.56 u[IU]/mL (ref 0.35–4.50)

## 2018-04-13 ENCOUNTER — Ambulatory Visit: Payer: Self-pay | Admitting: *Deleted

## 2018-04-13 DIAGNOSIS — E669 Obesity, unspecified: Principal | ICD-10-CM

## 2018-04-13 DIAGNOSIS — E1169 Type 2 diabetes mellitus with other specified complication: Secondary | ICD-10-CM

## 2018-04-13 NOTE — Telephone Encounter (Signed)
Pt reports the metformin she is taking has bad odor and taste; States "I can hardly swallow it and it leaves a bad taste." States a nurse from other practice told her to request slow release tabs.  Please advise: [phone]  Reason for Disposition . Caller has NON-URGENT medication question about med that PCP prescribed and triager unable to answer question  Answer Assessment - Initial Assessment Questions 1. SYMPTOMS: "Do you have any symptoms?"     No. Can't take due to odor, taste 2. SEVERITY: If symptoms are present, ask "Are they mild, moderate or severe?"     N/A  Protocols used: MEDICATION QUESTION CALL-A-AH

## 2018-04-14 NOTE — Telephone Encounter (Signed)
Please advise 

## 2018-04-15 NOTE — Telephone Encounter (Signed)
OK to change to Metfromin ER with same strength and same sig.

## 2018-04-17 MED ORDER — METFORMIN HCL ER 500 MG PO TB24: 500.0000 mg | ORAL_TABLET | Freq: Two times a day (BID) | ORAL | 0 refills | Status: DC

## 2018-04-17 NOTE — Telephone Encounter (Signed)
Metformin ER ordered for 500mg  bid per Dr. Charlett Blake.

## 2018-04-17 NOTE — Addendum Note (Signed)
Addended by: Raynelle Dick R on: 04/17/2018 11:04 AM   Modules accepted: Orders

## 2018-04-27 ENCOUNTER — Other Ambulatory Visit: Payer: Self-pay | Admitting: Family Medicine

## 2018-04-27 ENCOUNTER — Telehealth: Payer: Self-pay

## 2018-04-27 MED ORDER — TIZANIDINE HCL 2 MG PO TABS: ORAL_TABLET | ORAL | 1 refills | Status: DC

## 2018-04-27 NOTE — Telephone Encounter (Signed)
Copied from [place] [id number]. Topic: General - Other >> Apr 27, 2018  3:47 PM Carolyn Stare wrote:  Pt said her low back is bothering her again  Pt call to ask if the below med can be refilled  tiZANidine (ZANAFLEX) 2 MG tablet  Ph Ramond Dial

## 2018-06-05 ENCOUNTER — Other Ambulatory Visit: Payer: Self-pay | Admitting: Family Medicine

## 2018-06-05 DIAGNOSIS — I10 Essential (primary) hypertension: Secondary | ICD-10-CM

## 2018-06-28 ENCOUNTER — Other Ambulatory Visit: Payer: Self-pay | Admitting: Family Medicine

## 2018-07-11 ENCOUNTER — Other Ambulatory Visit: Payer: Self-pay | Admitting: Family Medicine

## 2018-07-11 DIAGNOSIS — E1169 Type 2 diabetes mellitus with other specified complication: Secondary | ICD-10-CM

## 2018-07-11 DIAGNOSIS — E669 Obesity, unspecified: Principal | ICD-10-CM

## 2018-08-07 ENCOUNTER — Encounter: Payer: Self-pay | Admitting: Family Medicine

## 2018-08-07 ENCOUNTER — Ambulatory Visit (INDEPENDENT_AMBULATORY_CARE_PROVIDER_SITE_OTHER): Payer: Medicare Other | Admitting: Family Medicine

## 2018-08-07 ENCOUNTER — Ambulatory Visit: Payer: Medicare Other | Admitting: *Deleted

## 2018-08-07 DIAGNOSIS — E1169 Type 2 diabetes mellitus with other specified complication: Secondary | ICD-10-CM | POA: Diagnosis not present

## 2018-08-07 DIAGNOSIS — I10 Essential (primary) hypertension: Secondary | ICD-10-CM

## 2018-08-07 DIAGNOSIS — M25511 Pain in right shoulder: Secondary | ICD-10-CM | POA: Insufficient documentation

## 2018-08-07 DIAGNOSIS — E785 Hyperlipidemia, unspecified: Secondary | ICD-10-CM | POA: Diagnosis not present

## 2018-08-07 DIAGNOSIS — E669 Obesity, unspecified: Secondary | ICD-10-CM | POA: Diagnosis not present

## 2018-08-07 DIAGNOSIS — F411 Generalized anxiety disorder: Secondary | ICD-10-CM

## 2018-08-07 DIAGNOSIS — Z23 Encounter for immunization: Secondary | ICD-10-CM

## 2018-08-07 LAB — LIPID PANEL
CHOL/HDL RATIO: 2
Cholesterol: 123 mg/dL (ref 0–200)
HDL: 57.4 mg/dL (ref >39.00)
LDL CALC: 46 mg/dL (ref 0–99)
NONHDL: 65.22
Triglycerides: 94 mg/dL (ref 0.0–149.0)
VLDL: 18.8 mg/dL (ref 0.0–40.0)

## 2018-08-07 LAB — COMPREHENSIVE METABOLIC PANEL
ALT: 18 U/L (ref 0–35)
AST: 23 U/L (ref 0–37)
Albumin: 4.3 g/dL (ref 3.5–5.2)
Alkaline Phosphatase: 113 U/L (ref 39–117)
BUN: 13 mg/dL (ref 6–23)
CHLORIDE: 103 meq/L (ref 96–112)
CO2: 26 meq/L (ref 19–32)
CREATININE: 0.89 mg/dL (ref 0.40–1.20)
Calcium: 9.8 mg/dL (ref 8.4–10.5)
GFR: 80.15 mL/min (ref >60.00)
GLUCOSE: 111 mg/dL — AB (ref 70–99)
Potassium: 4 mEq/L (ref 3.5–5.1)
SODIUM: 139 meq/L (ref 135–145)
Total Bilirubin: 1 mg/dL (ref 0.2–1.2)
Total Protein: 7.5 g/dL (ref 6.0–8.3)

## 2018-08-07 LAB — CBC
HCT: 38.7 % (ref 36.0–46.0)
Hemoglobin: 12.9 g/dL (ref 12.0–15.0)
MCHC: 33.3 g/dL (ref 30.0–36.0)
MCV: 92.3 fl (ref 78.0–100.0)
Platelets: 262 10*3/uL (ref 150.0–400.0)
RBC: 4.19 Mil/uL (ref 3.87–5.11)
RDW: 14.4 % (ref 11.5–15.5)
WBC: 6 10*3/uL (ref 4.0–10.5)

## 2018-08-07 LAB — TSH: TSH: 1.51 u[IU]/mL (ref 0.35–4.50)

## 2018-08-07 LAB — HEMOGLOBIN A1C: HEMOGLOBIN A1C: 6.7 % — AB (ref 4.6–6.5)

## 2018-08-07 MED ORDER — NITROGLYCERIN 0.4 MG SL SUBL: 0.4000 mg | SUBLINGUAL_TABLET | SUBLINGUAL | 1 refills | Status: DC | PRN

## 2018-08-07 NOTE — Assessment & Plan Note (Signed)
Had a flat tirea nd had to carry a 42# infant to get help and her shoulder has hurt since then. No radicular symptoms. Encouraged moist heat and gentle stretching as tolerated. May try NSAIDs and prescription meds as directed and report if symptoms worsen or seek immediate care. Lidocaine gel prn

## 2018-08-07 NOTE — Assessment & Plan Note (Signed)
Well controlled, no changes to meds. Encouraged heart healthy diet such as the DASH diet and exercise as tolerated.  °

## 2018-08-07 NOTE — Assessment & Plan Note (Signed)
hgba1c acceptable, minimize simple carbs. Increase exercise as tolerated. Continue current meds 

## 2018-08-07 NOTE — Progress Notes (Signed)
Subjective:    Patient ID: Heather Roberts, female    DOB: [birth year], 72 y.o.   MRN: [id number]  No chief complaint on file.   HPI Patient is in today for follow up. No new concerns. Continues to drive back and forth to [place] since her sister died. She is tired but no other acute concerns. Denies CP/palp/SOB/HA/congestion/fevers/GI or GU c/o. Taking meds as prescribed   Past Medical History:  Diagnosis Date  . Allergic rhinitis   . Anemia 03/19/2013  . Anxiety   . Bouchard nodes (DJD hand)   . Carotid artery disease ([place]) 03/24/2014  . Cervical cancer screening 07/31/2015  . Dense breast tissue 03/03/2017  . Diabetes mellitus type II   . DJD (degenerative joint disease), lumbar   . Fibroids   . Glaucoma   . H/O diverticulitis of colon 03/03/2017  . History of DVT (deep vein thrombosis) 1994  . Hyperlipidemia   . Hypertension   . Insomnia 02/03/2016  . Nocturia 12/18/2012  . Obesity 07/31/2015  . Palpitations 12/18/2012    Past Surgical History:  Procedure Laterality Date  . ABDOMINAL HYSTERECTOMY    . CHOLECYSTECTOMY    . EYE SURGERY  09/2014, 08/2014   Cataracts removal  . KNEE ARTHROSCOPY Bilateral    Left 2007, Right 1999    Family History  Problem Relation Age of Onset  . Diabetes Mother   . Kidney disease Mother   . COPD Father        smoker  . Stroke Sister   . Breast cancer Sister        s/p mastectomy  . COPD Brother   . Stroke Brother   . Stroke Unknown   . Diabetes Paternal Grandmother     Social History   Socioeconomic History  . Marital status: Married    Spouse name: Essick  . Number of children: 3  . Years of education: Not on file  . Highest education level: Not on file  Occupational History  . Occupation: Scientist, research (physical sciences): RETIRED    Comment: used to work for [employer]  . Financial resource strain: Not on file  . Food insecurity:    Worry: Not on file    Inability: Not on file  .  Transportation needs:    Medical: Not on file    Non-medical: Not on file  Tobacco Use  . Smoking status: Former Smoker    Packs/day: 0.30    Years: 8.00    Pack years: 2.40    Types: Cigarettes  . Smokeless tobacco: Never Used  Substance and Sexual Activity  . Alcohol use: Yes    Alcohol/week: 0.0 standard drinks    Comment: Rarely---glass of wine with a meeting  . Drug use: No  . Sexual activity: Yes    Partners: Male    Comment: lives with husband, no dietary restrictions, minimizes dairy is walking more now  Lifestyle  . Physical activity:    Days per week: Not on file    Minutes per session: Not on file  . Stress: Not on file  Relationships  . Social connections:    Talks on phone: Not on file    Gets together: Not on file    Attends religious service: Not on file    Active member of club or organization: Not on file    Attends meetings of clubs or organizations: Not on file    Relationship status: Not on file  .  Intimate partner violence:    Fear of current or ex partner: Not on file    Emotionally abused: Not on file    Physically abused: Not on file    Forced sexual activity: Not on file  Other Topics Concern  . 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 amLODipine (NORVASC) 10 MG tablet TAKE 1 TABLET(10 MG) BY MOUTH DAILY 90 tablet 0  . aspirin 81 MG tablet Take 81 mg by mouth daily.      Marland Kitchen atorvastatin (LIPITOR) 40 MG tablet TAKE 1 AND 1/2 TABLETS BY MOUTH DAILY 135 tablet 0  . Cyanocobalamin (B-12) 1000 MCG CAPS Take 1 tablet by mouth daily.    . fluticasone (FLONASE) 50 MCG/ACT nasal spray Place 2 sprays into both nostrils daily as needed for allergies or rhinitis. 16 g 6  . glucose blood (GLUCOSE METER TEST) test strip To use with onetouch  verio flex DX E11.9 100 each 6  . glucose blood (ONETOUCH VERIO) test strip Check blood sugar once daily.  DX E11.9 100 each 6  . hydrochlorothiazide (HYDRODIURIL) 12.5 MG tablet Take 1 tablet (12.5 mg total) by mouth daily. 30 tablet 5  . losartan (COZAAR) 100 MG tablet Take 1 tablet (100 mg total) by mouth daily. 90 tablet 1  . metFORMIN (GLUCOPHAGE-XR) 500 MG 24 hr tablet TAKE 1 TABLET(500 MG) BY MOUTH TWICE DAILY WITH MEALS 180 tablet 0  . metoprolol succinate (TOPROL-XL) 25 MG 24 hr tablet Take 25 mg by mouth as directed. 1/2 tablet daily with dinner    . mometasone (NASONEX) 50 MCG/ACT nasal spray Place 2 sprays into the nose daily.    Marland Kitchen tiZANidine (ZANAFLEX) 2 MG tablet TAKE 1 TABLET(2 MG) BY MOUTH AT BEDTIME AS NEEDED FOR MUSCLE SPASMS 90 tablet 1  . nitroGLYCERIN (NITROSTAT) 0.4 MG SL tablet Place 0.4 mg under the tongue every 5 (five) minutes as needed for chest pain.     No facility-administered medications prior to visit.     Allergies  Allergen Reactions  . Tussionex Pennkinetic Er [Hydrocod Polst-Cpm Polst Er] Other (See Comments)    Upset stomach and loose stool     Review of Systems  Constitutional: Positive for malaise/fatigue. Negative for fever.  HENT: Negative for congestion.   Eyes: Negative for blurred vision.  Respiratory: Negative for shortness of breath.   Cardiovascular: Negative for chest pain, palpitations and leg swelling.  Gastrointestinal: Negative for abdominal pain, blood in stool and nausea.  Genitourinary: Negative for dysuria and frequency.  Musculoskeletal: Negative for falls.  Skin: Negative for rash.  Neurological: Negative for dizziness, loss of consciousness and headaches.  Endo/Heme/Allergies: Negative for environmental allergies.  Psychiatric/Behavioral: Negative for depression. The patient is not nervous/anxious.        Objective:    Physical Exam  Constitutional: She is oriented to person, place, and time. She appears well-developed  and well-nourished. No distress.  HENT:  Head: Normocephalic and atraumatic.  Nose: Nose normal.  Eyes: Right eye exhibits no discharge. Left eye exhibits no discharge.  Neck: Normal range of motion. Neck supple.  Cardiovascular: Normal rate and regular rhythm.  No murmur heard. Pulmonary/Chest: Effort normal and  breath sounds normal.  Abdominal: Soft. Bowel sounds are normal. There is no tenderness.  Musculoskeletal: She exhibits no edema.  Neurological: She is alert and oriented to person, place, and time.  Skin: Skin is warm and dry.  Psychiatric: She has a normal mood and affect.  Nursing note and vitals reviewed.   BP 126/68 (BP Location: Left Arm, Patient Position: Sitting, Cuff Size: Normal)   Pulse 69   Temp 98.2 F (36.8 C) (Oral)   Resp 18   Ht 5\' 2"  (1.575 m)   Wt 179 lb 12.8 oz (81.6 kg)   SpO2 96%   BMI 32.89 kg/m  Wt Readings from Last 3 Encounters:  08/07/18 179 lb 12.8 oz (81.6 kg)  04/07/18 178 lb 3.2 oz (80.8 kg)  03/15/18 178 lb 1.9 oz (80.8 kg)     Lab Results  Component Value Date   WBC 5.4 04/11/2018   HGB 12.7 04/11/2018   HCT 37.9 04/11/2018   PLT 271.0 04/11/2018   GLUCOSE 139 (H) 04/11/2018   CHOL 154 04/11/2018   TRIG 117.0 04/11/2018   HDL 60.10 04/11/2018   LDLDIRECT 118.5 10/04/2007   LDLCALC 71 04/11/2018   ALT 19 04/11/2018   AST 23 04/11/2018   NA 137 04/11/2018   K 4.1 04/11/2018   CL 101 04/11/2018   CREATININE 0.88 04/11/2018   BUN 14 04/11/2018   CO2 28 04/11/2018   TSH 1.56 04/11/2018   HGBA1C 7.0 (H) 04/11/2018   MICROALBUR 0.50 06/14/2012    Lab Results  Component Value Date   TSH 1.56 04/11/2018   Lab Results  Component Value Date   WBC 5.4 04/11/2018   HGB 12.7 04/11/2018   HCT 37.9 04/11/2018   MCV 93.0 04/11/2018   PLT 271.0 04/11/2018   Lab Results  Component Value Date   NA 137 04/11/2018   K 4.1 04/11/2018   CO2 28 04/11/2018   GLUCOSE 139 (H) 04/11/2018   BUN 14 04/11/2018   CREATININE 0.88  04/11/2018   BILITOT 0.6 04/11/2018   ALKPHOS 110 04/11/2018   AST 23 04/11/2018   ALT 19 04/11/2018   PROT 7.4 04/11/2018   ALBUMIN 4.2 04/11/2018   CALCIUM 9.6 04/11/2018   GFR 81.28 04/11/2018   Lab Results  Component Value Date   CHOL 154 04/11/2018   Lab Results  Component Value Date   HDL 60.10 04/11/2018   Lab Results  Component Value Date   LDLCALC 71 04/11/2018   Lab Results  Component Value Date   TRIG 117.0 04/11/2018   Lab Results  Component Value Date   CHOLHDL 3 04/11/2018   Lab Results  Component Value Date   HGBA1C 7.0 (H) 04/11/2018       Assessment & Plan:   Problem List Items Addressed This Visit    Diabetes mellitus type 2 in obese ([place])    hgba1c acceptable, minimize simple carbs. Increase exercise as tolerated. Continue current meds      Relevant Orders   Hemoglobin A1c   Hyperlipidemia associated with type 2 diabetes mellitus ([place])    Tolerating statin, encouraged heart healthy diet, avoid trans fats, minimize simple carbs and saturated fats. Increase exercise as tolerated      Relevant Orders   Lipid panel   Anxiety state    Has been running to [place] 4 times since 04/25/23 since her sister died. She notes exhaustion but thinks it is just the stress.       Essential hypertension  Well controlled, no changes to meds. Encouraged heart healthy diet such as the DASH diet and exercise as tolerated.       Relevant Medications   nitroGLYCERIN (NITROSTAT) 0.4 MG SL tablet   Other Relevant Orders   CBC   Comprehensive metabolic panel   TSH   Shoulder pain, right    Had a flat tirea nd had to carry a 42# infant to get help and her shoulder has hurt since then. No radicular symptoms. Encouraged moist heat and gentle stretching as tolerated. May try NSAIDs and prescription meds as directed and report if symptoms worsen or seek immediate care. Lidocaine gel prn         I have changed [employer] Mechanic. Watkins Williams's nitroGLYCERIN. I am  also having her maintain her aspirin, fluticasone, B-12, glucose blood, glucose blood, mometasone, hydrochlorothiazide, metoprolol succinate, losartan, tiZANidine, amLODipine, atorvastatin, and metFORMIN.  Meds ordered this encounter  Medications  . nitroGLYCERIN (NITROSTAT) 0.4 MG SL tablet    Sig: Place 1 tablet (0.4 mg total) under the tongue every 5 (five) minutes as needed for chest pain.    Dispense:  25 tablet    Refill:  1     [name], MD

## 2018-08-07 NOTE — Assessment & Plan Note (Signed)
Tolerating statin, encouraged heart healthy diet, avoid trans fats, minimize simple carbs and saturated fats. Increase exercise as tolerated 

## 2018-08-07 NOTE — Patient Instructions (Addendum)
Shingrix is the new shingles shot, 2 shots over 2-6 months at the pharmacy  Hypertension Hypertension is another name for high blood pressure. High blood pressure forces your heart to work harder to pump blood. This can cause problems over time. There are two numbers in a blood pressure reading. There is a top number (systolic) over a bottom number (diastolic). It is best to have a blood pressure below 120/80. Healthy choices can help lower your blood pressure. You may need medicine to help lower your blood pressure if:  Your blood pressure cannot be lowered with healthy choices.  Your blood pressure is higher than 130/80.  Follow these instructions at home: Eating and drinking  If directed, follow the DASH eating plan. This diet includes: ? Filling half of your plate at each meal with fruits and vegetables. ? Filling one quarter of your plate at each meal with whole grains. Whole grains include whole wheat pasta, brown rice, and whole grain bread. ? Eating or drinking low-fat dairy products, such as skim milk or low-fat yogurt. ? Filling one quarter of your plate at each meal with low-fat (lean) proteins. Low-fat proteins include fish, skinless chicken, eggs, beans, and tofu. ? Avoiding fatty meat, cured and processed meat, or chicken with skin. ? Avoiding premade or processed food.  Eat less than 1,500 mg of salt (sodium) a day.  Limit alcohol use to no more than 1 drink a day for nonpregnant women and 2 drinks a day for men. One drink equals 12 oz of beer, 5 oz of wine, or 1 oz of hard liquor. Lifestyle  Work with your doctor to stay at a healthy weight or to lose weight. Ask your doctor what the best weight is for you.  Get at least 30 minutes of exercise that causes your heart to beat faster (aerobic exercise) most days of the week. This may include walking, swimming, or biking.  Get at least 30 minutes of exercise that strengthens your muscles (resistance exercise) at least 3  days a week. This may include lifting weights or pilates.  Do not use any products that contain nicotine or tobacco. This includes cigarettes and e-cigarettes. If you need help quitting, ask your doctor.  Check your blood pressure at home as told by your doctor.  Keep all follow-up visits as told by your doctor. This is important. Medicines  Take over-the-counter and prescription medicines only as told by your doctor. Follow directions carefully.  Do not skip doses of blood pressure medicine. The medicine does not work as well if you skip doses. Skipping doses also puts you at risk for problems.  Ask your doctor about side effects or reactions to medicines that you should watch for. Contact a doctor if:  You think you are having a reaction to the medicine you are taking.  You have headaches that keep coming back (recurring).  You feel dizzy.  You have swelling in your ankles.  You have trouble with your vision. Get help right away if:  You get a very bad headache.  You start to feel confused.  You feel weak or numb.  You feel faint.  You get very bad pain in your: ? Chest. ? Belly (abdomen).  You throw up (vomit) more than once.  You have trouble breathing. Summary  Hypertension is another name for high blood pressure.  Making healthy choices can help lower blood pressure. If your blood pressure cannot be controlled with healthy choices, you may need to take medicine.  This information is not intended to replace advice given to you by your health care provider. Make sure you discuss any questions you have with your health care provider. Document Released: 03/22/2008 Document Revised: 09/01/2016 Document Reviewed: 09/01/2016 Elsevier Interactive Patient Education  [employer].

## 2018-08-07 NOTE — Assessment & Plan Note (Signed)
Has been running to [place] 4 times since May 10, 2023 since her sister died. She notes exhaustion but thinks it is just the stress.

## 2018-08-09 ENCOUNTER — Telehealth: Payer: Self-pay | Admitting: Family Medicine

## 2018-08-09 NOTE — Telephone Encounter (Signed)
Please advise 

## 2018-08-09 NOTE — Telephone Encounter (Signed)
Copied from [place] [id number]. Topic: Quick Communication - See Telephone Encounter >> Aug 09, 2018 12:48 PM Burchel, Abbi R wrote: CRM for notification. See Telephone encounter for: 08/09/18.  Pt states she has a "tickle in her throat" and would like to know if it is okay for her to use some Vicks Vapor-rub.  Please call pt to advise.  Pt: [phone] (ok to leave Voice mail)

## 2018-08-09 NOTE — Telephone Encounter (Signed)
Yes  She can use vick's vapor rub on her neck as needed

## 2018-08-11 NOTE — Telephone Encounter (Signed)
Called patient left message for patient to call the office back  

## 2018-08-23 IMAGING — MG 2D DIGITAL SCREENING BILATERAL MAMMOGRAM WITH CAD AND ADJUNCT TO
8 series · 8 of 24 positions shown · non-contrast
Comparison: Previous exam(s).

CLINICAL DATA: Screening.

EXAM:
2D DIGITAL SCREENING BILATERAL MAMMOGRAM WITH CAD AND ADJUNCT TOMO

[R CC]
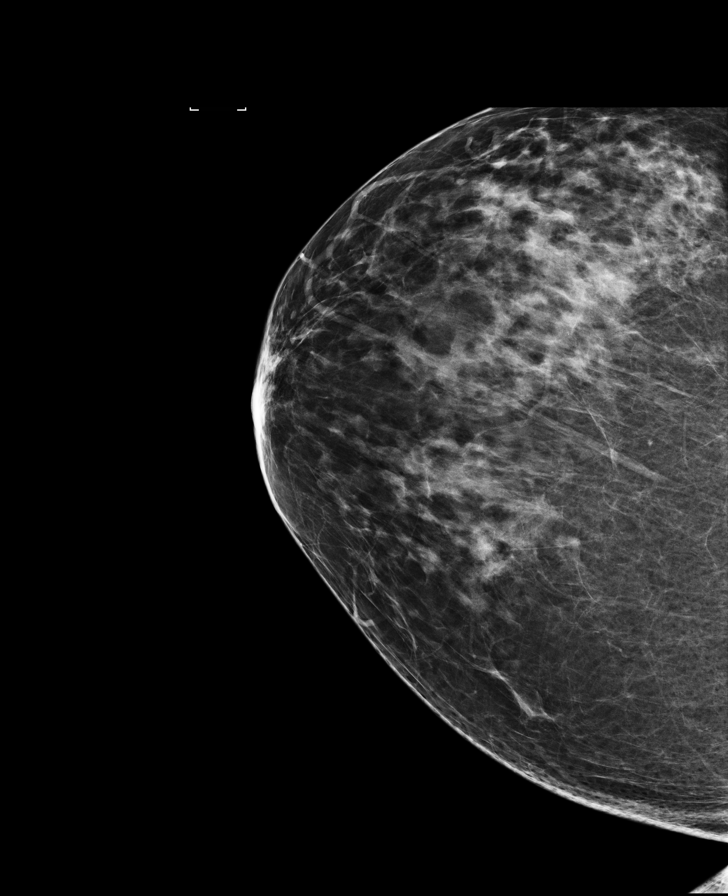

[L MLO]
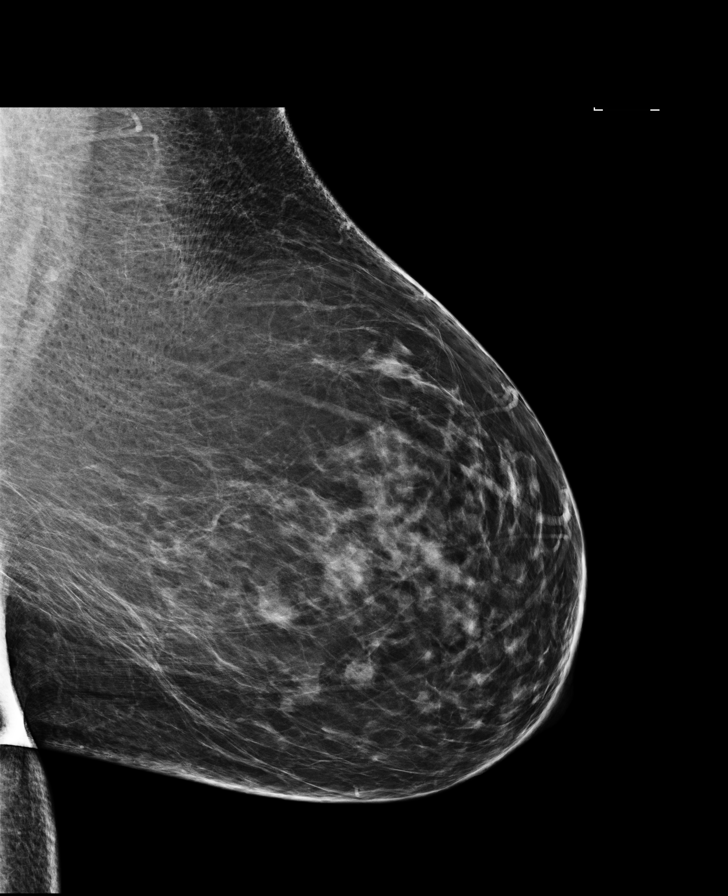

[L CC]
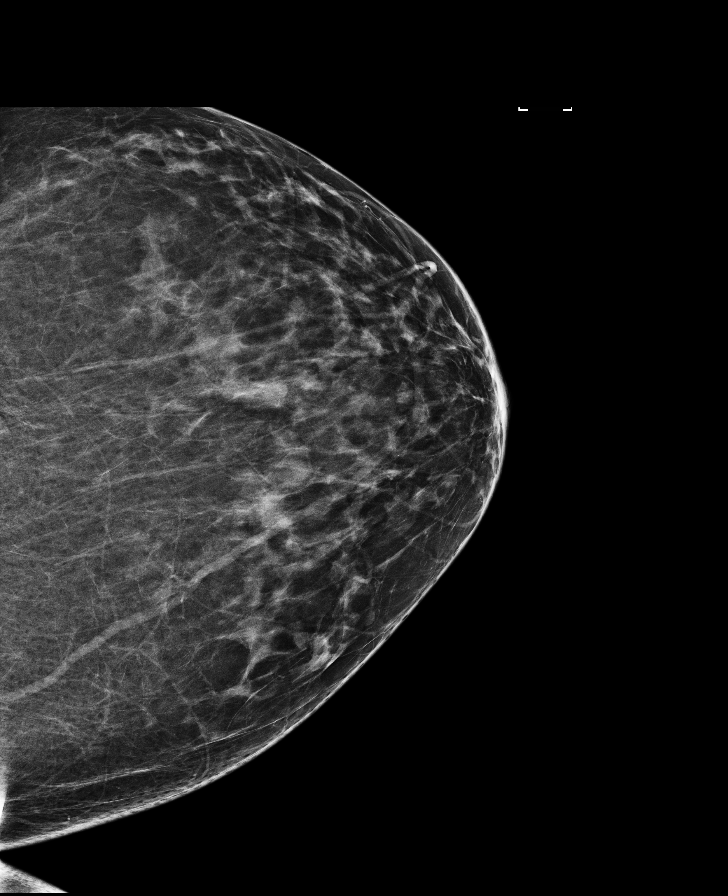

[R MLO]
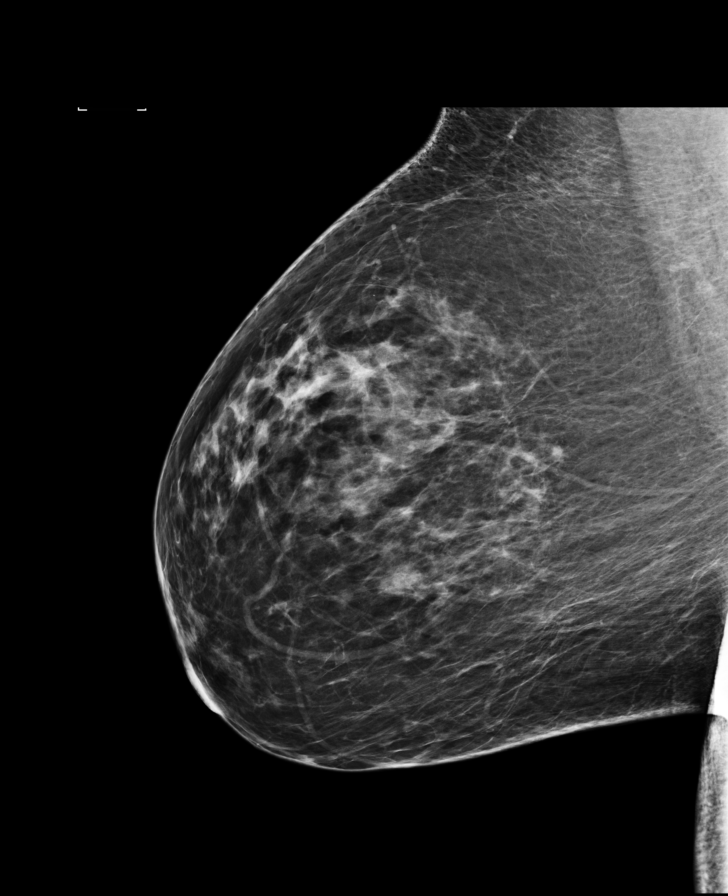

[R MLO tomo · tomo slice 43/86.0]
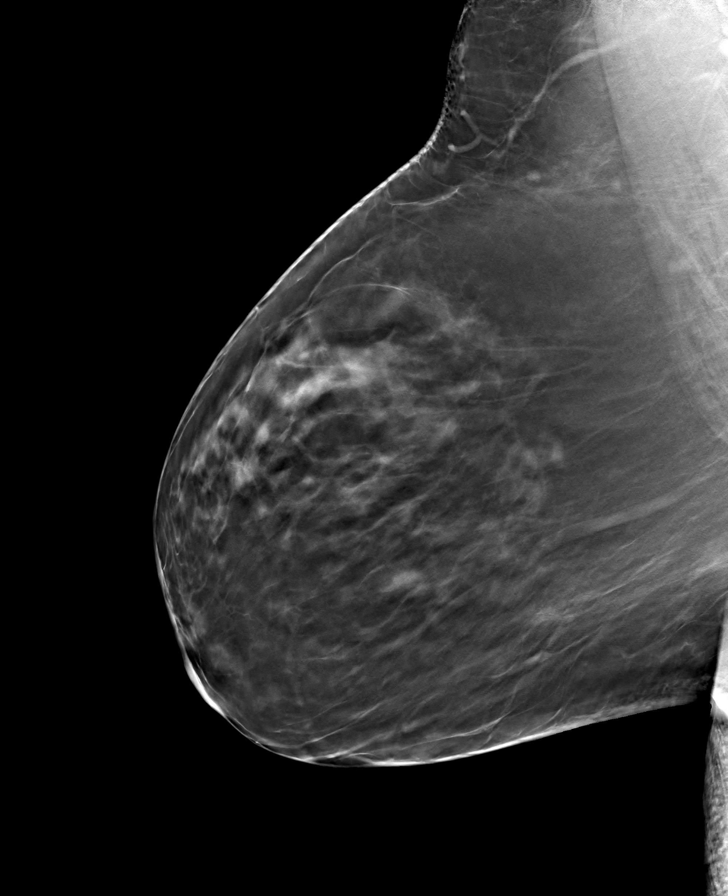

[L CC tomo · tomo slice 33/66.0]
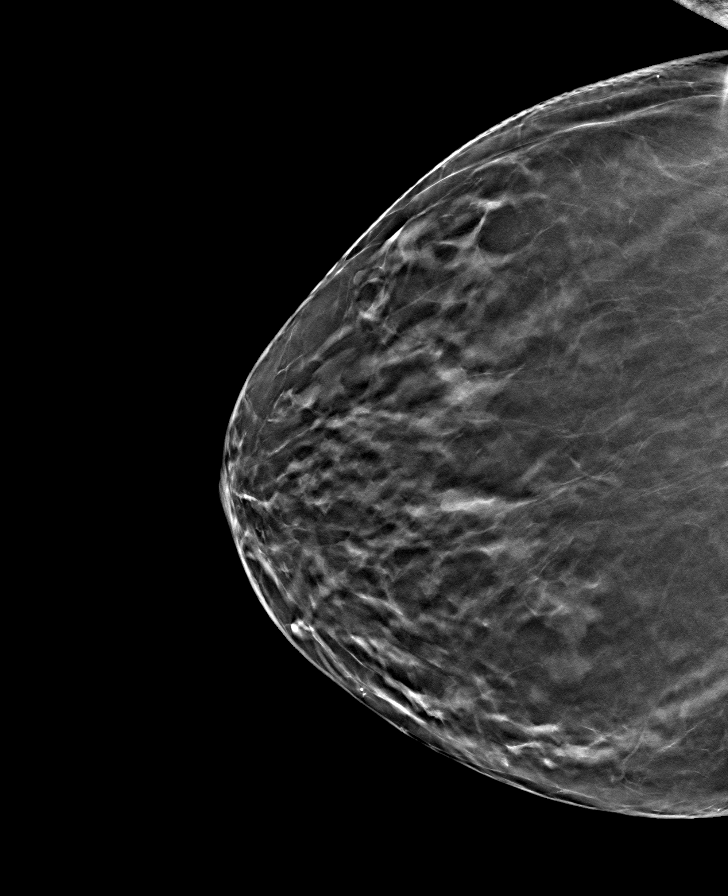

[L MLO tomo · tomo slice 45/90.0]
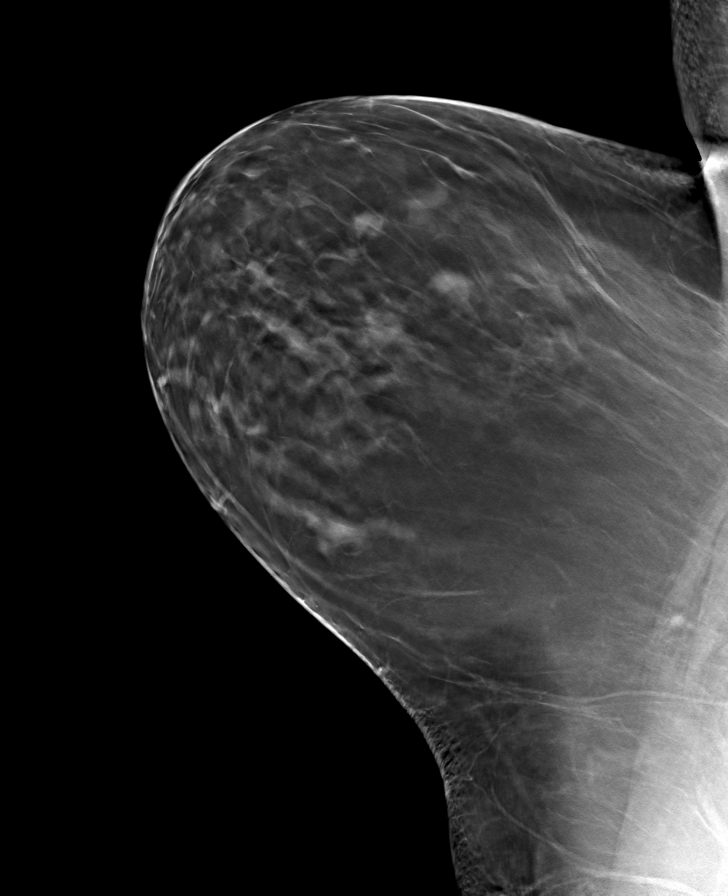

[R CC tomo · tomo slice 33/64.0]
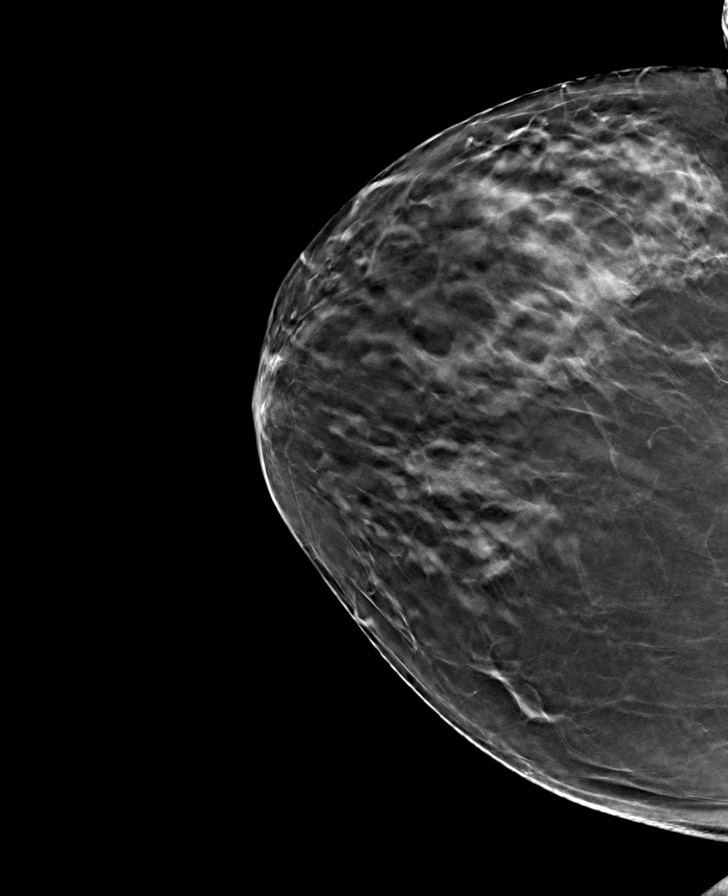

[8 of 24 positions shown; findings below may reference images not displayed]

ACR Breast Density Category c: The breast tissue is heterogeneously
dense, which may obscure small masses.
FINDINGS: In the right breast, a possible mass warrants further evaluation. In
the left breast, no findings suspicious for malignancy. Images were
processed with CAD.
IMPRESSION: Further evaluation is suggested for possible mass in the right
breast.

RECOMMENDATION:
Diagnostic mammogram and possibly ultrasound of the right breast.
(Code:[id number])

The patient will be contacted regarding the findings, and additional
imaging will be scheduled.

BI-RADS CATEGORY  0: Incomplete. Need additional imaging evaluation
and/or prior mammograms for comparison.

## 2018-08-29 ENCOUNTER — Ambulatory Visit (INDEPENDENT_AMBULATORY_CARE_PROVIDER_SITE_OTHER): Payer: Medicare Other | Admitting: Family Medicine

## 2018-08-29 ENCOUNTER — Encounter: Payer: Self-pay | Admitting: Family Medicine

## 2018-08-29 VITALS — BP 149/81 | HR 77 | Ht 61.0 in | Wt 173.0 lb

## 2018-08-29 DIAGNOSIS — M545 Low back pain, unspecified: Secondary | ICD-10-CM

## 2018-08-29 NOTE — Patient Instructions (Signed)
You have a lumbar strain. Aleve or ibuprofen with food for pain and inflammation. Tizanidine as needed for muscle spasms (no driving on this medicine if it makes you sleepy). Stay as active as possible. Heat 15 minutes at a time 3-4 times a day. Do home exercises and stretches as directed - hold each for 20-30 seconds and do each one three times. Consider massage, chiropractor, physical therapy, and/or acupuncture. Physical therapy has been shown to be helpful while the others have mixed results. Strengthening of low back muscles, abdominal musculature are key for long term pain relief. Follow up with me in 1 month or as needed if you're doing well.

## 2018-08-30 ENCOUNTER — Encounter: Payer: Self-pay | Admitting: Family Medicine

## 2018-08-30 NOTE — Progress Notes (Signed)
PCP: Mosie Lukes, MD  Subjective:   HPI: Patient is a 72 y.o. female here for low back pain.  Patient reports about 2 weeks ago she started to get pain in posterior right side of the neck. She's been working in the garden a lot picking turnips and collard greens and noticed pain started after this. Pain progressed and is now primarily into posterior right and left hips. Pain level 7/10 and sharp more on the left. Worse getting up in the morning. She's tried heat and ice but no medications though she still has some tizanidine. No skin changes, numbness. No radiation into extremities. No bowel/bladder dysfunction  Past Medical History:  Diagnosis Date  . Allergic rhinitis   . Anemia 03/19/2013  . Anxiety   . Bouchard nodes (DJD hand)   . Carotid artery disease ([place]) 03/24/2014  . Cervical cancer screening 07/31/2015  . Dense breast tissue 03/03/2017  . Diabetes mellitus type II   . DJD (degenerative joint disease), lumbar   . Fibroids   . Glaucoma   . H/O diverticulitis of colon 03/03/2017  . History of DVT (deep vein thrombosis) 1994  . Hyperlipidemia   . Hypertension   . Insomnia 02/03/2016  . Nocturia 12/18/2012  . Obesity 07/31/2015  . Palpitations 12/18/2012    Current Outpatient Medications on File Prior to Visit  Medication Sig Dispense Refill  . amLODipine (NORVASC) 10 MG tablet TAKE 1 TABLET(10 MG) BY MOUTH DAILY 90 tablet 0  . aspirin 81 MG tablet Take 81 mg by mouth daily.      Marland Kitchen atorvastatin (LIPITOR) 40 MG tablet TAKE 1 AND 1/2 TABLETS BY MOUTH DAILY 135 tablet 0  . Cyanocobalamin (B-12) 1000 MCG CAPS Take 1 tablet by mouth daily.    . fluticasone (FLONASE) 50 MCG/ACT nasal spray Place 2 sprays into both nostrils daily as needed for allergies or rhinitis. 16 g 6  . glucose blood (GLUCOSE METER TEST) test strip To use with onetouch verio flex DX E11.9 100 each 6  . glucose blood (ONETOUCH VERIO) test strip Check blood sugar once daily.  DX E11.9 100 each 6  .  hydrochlorothiazide (HYDRODIURIL) 12.5 MG tablet Take 1 tablet (12.5 mg total) by mouth daily. 30 tablet 5  . losartan (COZAAR) 100 MG tablet Take 1 tablet (100 mg total) by mouth daily. 90 tablet 1  . metFORMIN (GLUCOPHAGE-XR) 500 MG 24 hr tablet TAKE 1 TABLET(500 MG) BY MOUTH TWICE DAILY WITH MEALS 180 tablet 0  . metoprolol succinate (TOPROL-XL) 25 MG 24 hr tablet Take 25 mg by mouth as directed. 1/2 tablet daily with dinner    . mometasone (NASONEX) 50 MCG/ACT nasal spray Place 2 sprays into the nose daily.    . nitroGLYCERIN (NITROSTAT) 0.4 MG SL tablet Place 1 tablet (0.4 mg total) under the tongue every 5 (five) minutes as needed for chest pain. 25 tablet 1  . tiZANidine (ZANAFLEX) 2 MG tablet TAKE 1 TABLET(2 MG) BY MOUTH AT BEDTIME AS NEEDED FOR MUSCLE SPASMS 90 tablet 1   No current facility-administered medications on file prior to visit.     Past Surgical History:  Procedure Laterality Date  . ABDOMINAL HYSTERECTOMY    . CHOLECYSTECTOMY    . EYE SURGERY  09/2014, 08/2014   Cataracts removal  . KNEE ARTHROSCOPY Bilateral    Left 2007, Right 1999    Allergies  Allergen Reactions  . Tussionex Pennkinetic Er [Hydrocod Polst-Cpm Polst Er] Other (See Comments)    Upset stomach and  loose stool     Social History   Socioeconomic History  . Marital status: Married    Spouse name: Essick  . Number of children: 3  . Years of education: Not on file  . Highest education level: Not on file  Occupational History  . Occupation: Scientist, research (physical sciences): RETIRED    Comment: used to work for [employer]  . Financial resource strain: Not on file  . Food insecurity:    Worry: Not on file    Inability: Not on file  . Transportation needs:    Medical: Not on file    Non-medical: Not on file  Tobacco Use  . Smoking status: Former Smoker    Packs/day: 0.30    Years: 8.00    Pack years: 2.40    Types: Cigarettes  . Smokeless tobacco: Never Used  Substance  and Sexual Activity  . Alcohol use: Yes    Alcohol/week: 0.0 standard drinks    Comment: Rarely---glass of wine with a meeting  . Drug use: No  . Sexual activity: Yes    Partners: Male    Comment: lives with husband, no dietary restrictions, minimizes dairy is walking more now  Lifestyle  . Physical activity:    Days per week: Not on file    Minutes per session: Not on file  . Stress: Not on file  Relationships  . Social connections:    Talks on phone: Not on file    Gets together: Not on file    Attends religious service: Not on file    Active member of club or organization: Not on file    Attends meetings of clubs or organizations: Not on file    Relationship status: Not on file  . Intimate partner violence:    Fear of current or ex partner: Not on file    Emotionally abused: Not on file    Physically abused: Not on file    Forced sexual activity: Not on file  Other Topics Concern  . 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 Diabetes Mother   . Kidney disease Mother   . COPD Father        smoker  . Stroke Sister   . Breast cancer Sister        s/p mastectomy  . COPD Brother   . Stroke Brother   . Stroke Unknown   . Diabetes Paternal Grandmother     BP (!) 149/81   Pulse 77   Ht 5\' 1"  (1.549 m)   Wt 173 lb (78.5 kg)   BMI 32.69 kg/m   Review of Systems: See HPI above.     Objective:  Physical Exam:  Gen: NAD, comfortable in exam room  Back: No gross deformity, scoliosis. TTP bilateral lumbar paraspinal regions L > R.  No midline or bony TTP. FROM with pain on flexion. Strength LEs 5/5 all muscle groups.   Trace MSRs in patellar and achilles tendons, equal bilaterally. Negative SLRs. Sensation intact to light touch bilaterally.  Bilateral hips: No  deformity. FROM with 5/5 strength. No tenderness to palpation. NVI distally. Negative logroll bilateral hips Negative fabers and  piriformis stretches.   Assessment & Plan:  1. Low back pain - 2/2 lumbar strain.  Aleve or ibuprofen.  Tizanidine if needed.  Heat.  Shown home exercises to do daily.  Consider physical therapy.  F/u in 1 month or prn.

## 2018-09-04 ENCOUNTER — Ambulatory Visit: Payer: Medicare Other | Admitting: *Deleted

## 2018-09-04 ENCOUNTER — Telehealth: Payer: Self-pay

## 2018-09-04 NOTE — Telephone Encounter (Signed)
Copied from [place]. Topic: General - Other >> Sep 04, 2018  7:57 AM Yvette Rack wrote: Reason for CRM: pt calling wanting to reschedule her AWV  pt is going out of town today for a funeral pt will be back next week

## 2018-09-05 ENCOUNTER — Other Ambulatory Visit: Payer: Self-pay

## 2018-10-09 ENCOUNTER — Other Ambulatory Visit: Payer: Self-pay | Admitting: Family Medicine

## 2018-10-09 DIAGNOSIS — E669 Obesity, unspecified: Principal | ICD-10-CM

## 2018-10-09 DIAGNOSIS — E1169 Type 2 diabetes mellitus with other specified complication: Secondary | ICD-10-CM

## 2018-11-10 ENCOUNTER — Other Ambulatory Visit: Payer: Self-pay | Admitting: Family Medicine

## 2018-11-10 ENCOUNTER — Other Ambulatory Visit: Payer: Self-pay | Admitting: Internal Medicine

## 2018-11-10 DIAGNOSIS — Z1231 Encounter for screening mammogram for malignant neoplasm of breast: Secondary | ICD-10-CM

## 2018-12-11 ENCOUNTER — Other Ambulatory Visit: Payer: Self-pay | Admitting: Family Medicine

## 2018-12-11 DIAGNOSIS — E1169 Type 2 diabetes mellitus with other specified complication: Secondary | ICD-10-CM

## 2018-12-11 DIAGNOSIS — E669 Obesity, unspecified: Principal | ICD-10-CM

## 2018-12-11 MED ORDER — METFORMIN HCL ER 500 MG PO TB24: ORAL_TABLET | ORAL | 0 refills | Status: DC

## 2018-12-11 NOTE — Telephone Encounter (Signed)
Copied from [place] [id number]. Topic: Quick Communication - Rx Refill/Question >> Dec 11, 2018  3:02 PM [name], NT wrote: Medication: metFORMIN (GLUCOPHAGE-XR) 500 MG 24 hr tablet  Has the patient contacted their pharmacy? Yes.   (Agent: If no, request that the patient contact the pharmacy for the refill.) (Agent: If yes, when and what did the pharmacy advise?)  Preferred Pharmacy (with phone number or street name): [id number] - [place], [address] AT [address] [phone] (Phone) [phone] (Fax)    Agent: Please be advised that RX refills may take up to 3 business days. We ask that you follow-up with your pharmacy.

## 2018-12-22 ENCOUNTER — Ambulatory Visit
Admission: RE | Admit: 2018-12-22 | Discharge: 2018-12-22 | Disposition: A | Discharge status: Home / Self Care | Payer: Medicare Other | Source: Ambulatory Visit | Attending: Family Medicine | Admitting: Family Medicine

## 2018-12-22 DIAGNOSIS — Z1231 Encounter for screening mammogram for malignant neoplasm of breast: Secondary | ICD-10-CM

## 2018-12-27 ENCOUNTER — Ambulatory Visit (INDEPENDENT_AMBULATORY_CARE_PROVIDER_SITE_OTHER): Payer: Medicare Other | Admitting: Family Medicine

## 2018-12-27 ENCOUNTER — Encounter: Payer: Self-pay | Admitting: Family Medicine

## 2018-12-27 VITALS — BP 125/79 | HR 76 | Ht 61.0 in | Wt 177.8 lb

## 2018-12-27 DIAGNOSIS — M25511 Pain in right shoulder: Secondary | ICD-10-CM | POA: Diagnosis not present

## 2018-12-27 NOTE — Patient Instructions (Signed)
You have rotator cuff impingement Try to avoid painful activities (overhead activities, lifting with extended arm) as much as possible. Aleve 2 tabs twice a day with food OR ibuprofen 3 tabs three times a day with food for pain and inflammation - take for 7 days then as needed. Can take tylenol in addition to this. Subacromial injection may be beneficial to help with pain and to decrease inflammation. Consider physical therapy with transition to home exercise program. Do home exercise program with theraband and scapular stabilization exercises daily 3 sets of 10 once a day. If not improving at follow-up we will consider further imaging, injection, physical therapy, and/or nitro patches. Follow up with me in 1 month but call me in a couple weeks if you're not improving as expected.

## 2018-12-27 NOTE — Progress Notes (Signed)
PCP: Mosie Lukes, MD  Subjective:   HPI: Patient is a 73 y.o. female here for right shoulder pain.  Patient reports 3 days ago she was reaching for syrup at the store and noticed she couldn't do it with her right arm. Pain level 7/10, difficulty with reaching still and with lying on right side. Feels better propped up. Tried ice, heat. Motion limited. No acute injury or trauma. No skin changes, numbness. No radiation of pain.  Past Medical History:  Diagnosis Date  . Allergic rhinitis   . Anemia 03/19/2013  . Anxiety   . Bouchard nodes (DJD hand)   . Carotid artery disease ([place]) 03/24/2014  . Cervical cancer screening 07/31/2015  . Dense breast tissue 03/03/2017  . Diabetes mellitus type II   . DJD (degenerative joint disease), lumbar   . Fibroids   . Glaucoma   . H/O diverticulitis of colon 03/03/2017  . History of DVT (deep vein thrombosis) 1994  . Hyperlipidemia   . Hypertension   . Insomnia 02/03/2016  . Nocturia 12/18/2012  . Obesity 07/31/2015  . Palpitations 12/18/2012    Current Outpatient Medications on File Prior to Visit  Medication Sig Dispense Refill  . amLODipine (NORVASC) 10 MG tablet TAKE 1 TABLET(10 MG) BY MOUTH DAILY 90 tablet 0  . aspirin 81 MG tablet Take 81 mg by mouth daily.      Marland Kitchen atorvastatin (LIPITOR) 40 MG tablet TAKE 1 AND 1/2 TABLETS BY MOUTH DAILY 135 tablet 0  . Cyanocobalamin (B-12) 1000 MCG CAPS Take 1 tablet by mouth daily.    . fluticasone (FLONASE) 50 MCG/ACT nasal spray Place 2 sprays into both nostrils daily as needed for allergies or rhinitis. 16 g 6  . glucose blood (GLUCOSE METER TEST) test strip To use with onetouch verio flex DX E11.9 100 each 6  . glucose blood (ONETOUCH VERIO) test strip Check blood sugar once daily.  DX E11.9 100 each 6  . hydrochlorothiazide (HYDRODIURIL) 12.5 MG tablet Take 1 tablet (12.5 mg total) by mouth daily. 30 tablet 5  . losartan (COZAAR) 100 MG tablet Take 1 tablet (100 mg total) by mouth daily. 90  tablet 1  . metFORMIN (GLUCOPHAGE-XR) 500 MG 24 hr tablet TAKE 1 TABLET(500 MG) BY MOUTH TWICE DAILY WITH MEALS 180 tablet 0  . metoprolol succinate (TOPROL-XL) 25 MG 24 hr tablet Take 25 mg by mouth as directed. 1/2 tablet daily with dinner    . mometasone (NASONEX) 50 MCG/ACT nasal spray Place 2 sprays into the nose daily.    . nitroGLYCERIN (NITROSTAT) 0.4 MG SL tablet Place 1 tablet (0.4 mg total) under the tongue every 5 (five) minutes as needed for chest pain. 25 tablet 1  . tiZANidine (ZANAFLEX) 2 MG tablet TAKE 1 TABLET(2 MG) BY MOUTH AT BEDTIME AS NEEDED FOR MUSCLE SPASMS 90 tablet 1   No current facility-administered medications on file prior to visit.     Past Surgical History:  Procedure Laterality Date  . ABDOMINAL HYSTERECTOMY    . CHOLECYSTECTOMY    . EYE SURGERY  09/2014, 08/2014   Cataracts removal  . KNEE ARTHROSCOPY Bilateral    Left 2007, Right 1999    Allergies  Allergen Reactions  . Tussionex Pennkinetic Er [Hydrocod Polst-Cpm Polst Er] Other (See Comments)    Upset stomach and loose stool     Social History   Socioeconomic History  . Marital status: Married    Spouse name: Essick  . Number of children: 3  .  Years of education: Not on file  . Highest education level: Not on file  Occupational History  . Occupation: Scientist, research (physical sciences): RETIRED    Comment: used to work for [employer]  . Financial resource strain: Not on file  . Food insecurity:    Worry: Not on file    Inability: Not on file  . Transportation needs:    Medical: Not on file    Non-medical: Not on file  Tobacco Use  . Smoking status: Former Smoker    Packs/day: 0.30    Years: 8.00    Pack years: 2.40    Types: Cigarettes  . Smokeless tobacco: Never Used  Substance and Sexual Activity  . Alcohol use: Yes    Alcohol/week: 0.0 standard drinks    Comment: Rarely---glass of wine with a meeting  . Drug use: No  . Sexual activity: Yes    Partners: Male     Comment: lives with husband, no dietary restrictions, minimizes dairy is walking more now  Lifestyle  . Physical activity:    Days per week: Not on file    Minutes per session: Not on file  . Stress: Not on file  Relationships  . Social connections:    Talks on phone: Not on file    Gets together: Not on file    Attends religious service: Not on file    Active member of club or organization: Not on file    Attends meetings of clubs or organizations: Not on file    Relationship status: Not on file  . Intimate partner violence:    Fear of current or ex partner: Not on file    Emotionally abused: Not on file    Physically abused: Not on file    Forced sexual activity: Not on file  Other Topics Concern  . 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 Diabetes Mother   . Kidney disease Mother   . COPD Father        smoker  . Stroke Sister   . Breast cancer Sister        s/p mastectomy  . COPD Brother   . Stroke Brother   . Stroke Other   . Diabetes Paternal Grandmother     BP 125/79   Pulse 76   Ht 5\' 1"  (1.549 m)   Wt 177 lb 12.8 oz (80.6 kg)   BMI 33.60 kg/m   Review of Systems: See HPI above.     Objective:  Physical Exam:  Gen: NAD, comfortable in exam room  Right shoulder: No swelling, ecchymoses.  No gross deformity. No TTP. FROM but difficulty with flexion. Positive Hawkins, Neers. Negative Yergasons. Strength 5/5 with empty can and resisted internal/external rotation. Negative apprehension. NV intact distally.  Left shoulder: No swelling, ecchymoses.  No gross deformity. No TTP. FROM. Strength 5/5 with empty can and resisted internal/external rotation. NV intact distally.   Assessment & Plan:  1. Right shoulder pain - rotator cuff strength  reassuring and no acute injury.  Consistent with rotator cuff impingement.  Aleve or ibuprofen regularly for 7 days then as needed.  Shown home exercises to  do daily.  Consider imaging, injection, PT, nitro patches if not improving.  F/u in 1 month but call sooner if not improving as expected.

## 2019-01-03 ENCOUNTER — Ambulatory Visit: Payer: Medicare Other | Admitting: Family Medicine

## 2019-01-29 ENCOUNTER — Ambulatory Visit: Payer: Medicare Other | Admitting: Family Medicine

## 2019-02-05 ENCOUNTER — Other Ambulatory Visit: Payer: Self-pay

## 2019-02-05 ENCOUNTER — Ambulatory Visit (INDEPENDENT_AMBULATORY_CARE_PROVIDER_SITE_OTHER): Payer: Medicare Other | Admitting: Family Medicine

## 2019-02-05 DIAGNOSIS — E785 Hyperlipidemia, unspecified: Secondary | ICD-10-CM

## 2019-02-05 DIAGNOSIS — E669 Obesity, unspecified: Secondary | ICD-10-CM | POA: Diagnosis not present

## 2019-02-05 DIAGNOSIS — I10 Essential (primary) hypertension: Secondary | ICD-10-CM | POA: Diagnosis not present

## 2019-02-05 DIAGNOSIS — E1169 Type 2 diabetes mellitus with other specified complication: Secondary | ICD-10-CM | POA: Diagnosis not present

## 2019-02-05 DIAGNOSIS — M25511 Pain in right shoulder: Secondary | ICD-10-CM

## 2019-02-05 DIAGNOSIS — K5900 Constipation, unspecified: Secondary | ICD-10-CM

## 2019-02-05 NOTE — Assessment & Plan Note (Signed)
Encouraged heart healthy diet, increase exercise, avoid trans fats, consider a krill oil cap daily, tolerating Atorvastatin 

## 2019-02-05 NOTE — Assessment & Plan Note (Signed)
Try mixing Miralax and Benefiber together once to twice daily. Hydrate and exercise.

## 2019-02-05 NOTE — Assessment & Plan Note (Signed)
FSG this am 129, ranges 97-148. hgba1c acceptable, minimize simple carbs. Increase exercise as tolerated. Continue current meds

## 2019-02-05 NOTE — Progress Notes (Signed)
Virtual Visit via Telephone Note  I connected with Heather Roberts on 02/05/19 at  8:40 AM EDT by telephone and verified that I am speaking with the correct person using two identifiers.   I discussed the limitations, risks, security and privacy concerns of performing an evaluation and management service by telephone and the availability of in person appointments. I also discussed with the patient that there may be a patient responsible charge related to this service. The patient expressed understanding and agreed to proceed. Patient does not have the equipment to perform a video visit.      Subjective:    Patient ID: Heather Roberts, female    DOB: October 29, 1945, 73 y.o.   MRN: [id number]  No chief complaint on file.   HPI Patient is in today for follow up on diabetes, hyperlipidemia, hypertension and more. She feels well today. No recent febrile illness or hospitalizations. No polyuria or polydipsia. Her sugars have been well controlled. Ranges from 97 to 148. Her blood pressures generally hanging in the 120s over 70s. Denies CP/palp/SOB/HA/congestion/fevers/GI or GU c/o. Taking meds as prescribed. Has had some trouble with her right shoulder since she fell on December 27, 2018. Has been seen by sports medicine and is improving with Advil prn. Is improving. Moves her shoulders daily  Past Medical History:  Diagnosis Date  . Allergic rhinitis   . Anemia 03/19/2013  . Anxiety   . Bouchard nodes (DJD hand)   . Carotid artery disease ([place]) 03/24/2014  . Cervical cancer screening 07/31/2015  . Dense breast tissue 03/03/2017  . Diabetes mellitus type II   . DJD (degenerative joint disease), lumbar   . Fibroids   . Glaucoma   . H/O diverticulitis of colon 03/03/2017  . History of DVT (deep vein thrombosis) 1994  . Hyperlipidemia   . Hypertension   . Insomnia 02/03/2016  . Nocturia 12/18/2012  . Obesity 07/31/2015  . Palpitations 12/18/2012    Past Surgical History:  Procedure  Laterality Date  . ABDOMINAL HYSTERECTOMY    . CHOLECYSTECTOMY    . EYE SURGERY  09/2014, 08/2014   Cataracts removal  . KNEE ARTHROSCOPY Bilateral    Left 2007, Right 1999    Family History  Problem Relation Age of Onset  . Diabetes Mother   . Kidney disease Mother   . COPD Father        smoker  . Stroke Sister   . Breast cancer Sister        s/p mastectomy  . COPD Brother   . Stroke Brother   . Stroke Other   . Diabetes Paternal Grandmother     Social History   Socioeconomic History  . Marital status: Married    Spouse name: Essick  . Number of children: 3  . Years of education: Not on file  . Highest education level: Not on file  Occupational History  . Occupation: Scientist, research (physical sciences): RETIRED    Comment: used to work for [employer]  . Financial resource strain: Not on file  . Food insecurity:    Worry: Not on file    Inability: Not on file  . Transportation needs:    Medical: Not on file    Non-medical: Not on file  Tobacco Use  . Smoking status: Former Smoker    Packs/day: 0.30    Years: 8.00    Pack years: 2.40    Types: Cigarettes  . Smokeless tobacco: Never Used  Substance and Sexual Activity  . Alcohol use: Yes    Alcohol/week: 0.0 standard drinks    Comment: Rarely---glass of wine with a meeting  . Drug use: No  . Sexual activity: Yes    Partners: Male    Comment: lives with husband, no dietary restrictions, minimizes dairy is walking more now  Lifestyle  . Physical activity:    Days per week: Not on file    Minutes per session: Not on file  . Stress: Not on file  Relationships  . Social connections:    Talks on phone: Not on file    Gets together: Not on file    Attends religious service: Not on file    Active member of club or organization: Not on file    Attends meetings of clubs or organizations: Not on file    Relationship status: Not on file  . Intimate partner violence:    Fear of current or ex  partner: Not on file    Emotionally abused: Not on file    Physically abused: Not on file    Forced sexual activity: Not on file  Other Topics Concern  . 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 amLODipine (NORVASC) 10 MG tablet TAKE 1 TABLET(10 MG) BY MOUTH DAILY 90 tablet 0  . aspirin 81 MG tablet Take 81 mg by mouth daily.      Marland Kitchen atorvastatin (LIPITOR) 40 MG tablet TAKE 1 AND 1/2 TABLETS BY MOUTH DAILY 135 tablet 0  . Cyanocobalamin (B-12) 1000 MCG CAPS Take 1 tablet by mouth daily.    . fluticasone (FLONASE) 50 MCG/ACT nasal spray Place 2 sprays into both nostrils daily as needed for allergies or rhinitis. 16 g 6  . glucose blood (GLUCOSE METER TEST) test strip To use with onetouch verio flex DX E11.9 100 each 6  . glucose blood (ONETOUCH VERIO) test strip Check blood sugar once daily.  DX E11.9 100 each 6  . hydrochlorothiazide (HYDRODIURIL) 12.5 MG tablet Take 1 tablet (12.5 mg total) by mouth daily. 30 tablet 5  . losartan (COZAAR) 100 MG tablet Take 1 tablet (100 mg total) by mouth daily. 90 tablet 1  . metFORMIN (GLUCOPHAGE-XR) 500 MG 24 hr tablet TAKE 1 TABLET(500 MG) BY MOUTH TWICE DAILY WITH MEALS 180 tablet 0  . metoprolol succinate (TOPROL-XL) 25 MG 24 hr tablet Take 25 mg by mouth as directed. 1/2 tablet daily with dinner    . mometasone (NASONEX) 50 MCG/ACT nasal spray Place 2 sprays into the nose daily.    . nitroGLYCERIN (NITROSTAT) 0.4 MG SL tablet Place 1 tablet (0.4 mg total) under the tongue every 5 (five) minutes as needed for chest pain. 25 tablet 1  . tiZANidine (ZANAFLEX) 2 MG tablet TAKE 1 TABLET(2 MG) BY MOUTH AT BEDTIME AS NEEDED FOR MUSCLE SPASMS 90 tablet 1   No facility-administered medications prior to visit.     Allergies  Allergen  Reactions  . Tussionex Pennkinetic Er [Hydrocod Polst-Cpm Polst Er] Other (See Comments)    Upset stomach and loose stool     Review of Systems  Constitutional: Negative for fever and malaise/fatigue.  HENT: Negative for  congestion.   Eyes: Negative for blurred vision.  Respiratory: Negative for shortness of breath.   Cardiovascular: Negative for chest pain, palpitations and leg swelling.  Gastrointestinal: Negative for abdominal pain, blood in stool and nausea.  Genitourinary: Negative for dysuria and frequency.  Musculoskeletal: Negative for falls.  Skin: Negative for rash.  Neurological: Negative for dizziness, loss of consciousness and headaches.  Endo/Heme/Allergies: Negative for environmental allergies.  Psychiatric/Behavioral: Negative for depression. The patient is not nervous/anxious.        Objective:    Physical Exam unable to obtain via telephone  BP (!) 147/79 (BP Location: Left Arm, Patient Position: Sitting, Cuff Size: Normal) Comment: Taking at home  Wt 177 lb 6.4 oz (80.5 kg)   BMI 33.52 kg/m  Wt Readings from Last 3 Encounters:  02/05/19 177 lb 6.4 oz (80.5 kg)  12/27/18 177 lb 12.8 oz (80.6 kg)  08/29/18 173 lb (78.5 kg)    Diabetic Foot Exam - Simple   No data filed     Lab Results  Component Value Date   WBC 6.0 08/07/2018   HGB 12.9 08/07/2018   HCT 38.7 08/07/2018   PLT 262.0 08/07/2018   GLUCOSE 111 (H) 08/07/2018   CHOL 123 08/07/2018   TRIG 94.0 08/07/2018   HDL 57.40 08/07/2018   LDLDIRECT 118.5 10/04/2007   LDLCALC 46 08/07/2018   ALT 18 08/07/2018   AST 23 08/07/2018   NA 139 08/07/2018   K 4.0 08/07/2018   CL 103 08/07/2018   CREATININE 0.89 08/07/2018   BUN 13 08/07/2018   CO2 26 08/07/2018   TSH 1.51 08/07/2018   HGBA1C 6.7 (H) 08/07/2018   MICROALBUR 0.50 06/14/2012    Lab Results  Component Value Date   TSH 1.51 08/07/2018   Lab Results  Component Value Date   WBC 6.0 08/07/2018   HGB 12.9 08/07/2018   HCT 38.7  08/07/2018   MCV 92.3 08/07/2018   PLT 262.0 08/07/2018   Lab Results  Component Value Date   NA 139 08/07/2018   K 4.0 08/07/2018   CO2 26 08/07/2018   GLUCOSE 111 (H) 08/07/2018   BUN 13 08/07/2018   CREATININE 0.89 08/07/2018   BILITOT 1.0 08/07/2018   ALKPHOS 113 08/07/2018   AST 23 08/07/2018   ALT 18 08/07/2018   PROT 7.5 08/07/2018   ALBUMIN 4.3 08/07/2018   CALCIUM 9.8 08/07/2018   GFR 80.15 08/07/2018   Lab Results  Component Value Date   CHOL 123 08/07/2018   Lab Results  Component Value Date   HDL 57.40 08/07/2018   Lab Results  Component Value Date   LDLCALC 46 08/07/2018   Lab Results  Component Value Date   TRIG 94.0 08/07/2018   Lab Results  Component Value Date   CHOLHDL 2 08/07/2018   Lab Results  Component Value Date   HGBA1C 6.7 (H) 08/07/2018       Assessment & Plan:   Problem List Items Addressed This Visit    Diabetes mellitus type 2 in obese ([place])    FSG this am 129, ranges 97-148. hgba1c acceptable, minimize simple carbs. Increase exercise as tolerated. Continue current meds      Hyperlipidemia associated with type 2 diabetes mellitus ([place])    Encouraged heart healthy diet, increase exercise, avoid trans fats, consider a krill oil cap daily, tolerating Atorvastatin      Essential hypertension    no changes to meds. Encouraged heart healthy diet such as the DASH diet and exercise  as tolerated. Was slightly high this am but generally she reports 120s/70s.       Constipation    Try mixing Miralax and Benefiber together once to twice daily. Hydrate and exercise.      Shoulder pain, right    Vernie Shanks been seen by sports medicine and she is doing better. Moving the shoulder better. Using Ibuprofen prn, recommend Tylenol ES 500 mg 1-2 tabs up to 3 a day         I am having Council Mechanic. Garfield Cornea maintain her aspirin, fluticasone, B-12, glucose blood, glucose blood, mometasone, hydrochlorothiazide, metoprolol succinate,  losartan, tiZANidine, amLODipine, nitroGLYCERIN, atorvastatin, and metFORMIN.  No orders of the defined types were placed in this encounter.  I discussed the assessment and treatment plan with the patient. The patient was provided an opportunity to ask questions and all were answered. The patient agreed with the plan and demonstrated an understanding of the instructions.   The patient was advised to call back or seek an in-person evaluation if the symptoms worsen or if the condition fails to improve as anticipated.  I provided 23 minutes of non-face-to-face time during this encounter.   [name], MD

## 2019-02-05 NOTE — Assessment & Plan Note (Signed)
\  has been seen by sports medicine and she is doing better. Moving the shoulder better. Using Ibuprofen prn, recommend Tylenol ES 500 mg 1-2 tabs up to 3 a day

## 2019-02-05 NOTE — Assessment & Plan Note (Signed)
no changes to meds. Encouraged heart healthy diet such as the DASH diet and exercise as tolerated. Was slightly high this am but generally she reports 120s/70s.

## 2019-03-05 ENCOUNTER — Other Ambulatory Visit: Payer: Self-pay | Admitting: Family Medicine

## 2019-03-05 MED ORDER — ATORVASTATIN CALCIUM 40 MG PO TABS: 60.0000 mg | ORAL_TABLET | Freq: Every day | ORAL | 1 refills | Status: DC

## 2019-03-05 NOTE — Telephone Encounter (Signed)
Copied from [place] [id number]. Topic: Quick Communication - Rx Refill/Question >> Mar 05, 2019 11:25 AM Scherrie Gerlach wrote: Medication: atorvastatin (LIPITOR) 40 MG tablet 90 days Pt states the pharmacy told her they had not heard from the dr concerning her refill, but I do not see that we received anaything.  Can you send to [id number] - [place], [place] [phone] (Phone) [phone] (Fax)  Pt has been out 4 days

## 2019-03-05 NOTE — Telephone Encounter (Signed)
Rx sent 

## 2019-03-21 ENCOUNTER — Other Ambulatory Visit: Payer: Self-pay

## 2019-03-21 DIAGNOSIS — E1169 Type 2 diabetes mellitus with other specified complication: Secondary | ICD-10-CM

## 2019-03-21 MED ORDER — METFORMIN HCL ER 500 MG PO TB24
ORAL_TABLET | ORAL | 1 refills | Status: DC
Start: 2019-03-21 — End: 2019-12-31

## 2019-04-02 ENCOUNTER — Telehealth: Payer: Self-pay | Admitting: Cardiology

## 2019-04-02 NOTE — Telephone Encounter (Signed)
New Message      Pt c/o swelling: STAT is pt has developed SOB within 24 hours  1) How much weight have you gained and in what time span? Gained 1 pound  2) If swelling, where is the swelling located? Right leg is swollen and has a big purple circle on left thigh  3) Are you currently taking a fluid pill? Yes  4) Are you currently SOB? No but she did on Friday and it went away after they fixed her air conditioning   5) Do you have a log of your daily weights (if so, list)? Yes  6) Have you gained 3 pounds in a day or 5 pounds in a week? No   7) Have you traveled recently? No  Pt says her A/C went out last week and shes not sure if she is  Swollen from that

## 2019-04-02 NOTE — Telephone Encounter (Signed)
Returned call to patient no answer.LMTC. 

## 2019-04-04 NOTE — Telephone Encounter (Signed)
Called patient, LVM advising to call back to discuss how swelling was doing.  Left call back number.

## 2019-04-04 NOTE — Telephone Encounter (Signed)
Spoke with the patient. She stated that last week for 4 days she didn't have any air conditioning. She stated that her feet swelled up bilaterally. Once the air was fixed, her edema resolved and she has not had any since. She denied shortness of breath and chest pain.  She has an appointment with her PCP on 04/09/2019. She has been advised to call back if her swelling returns or if she needs anything further.

## 2019-04-09 ENCOUNTER — Ambulatory Visit (INDEPENDENT_AMBULATORY_CARE_PROVIDER_SITE_OTHER): Payer: Medicare Other | Admitting: Family Medicine

## 2019-04-09 ENCOUNTER — Other Ambulatory Visit: Payer: Self-pay

## 2019-04-09 DIAGNOSIS — E669 Obesity, unspecified: Secondary | ICD-10-CM

## 2019-04-09 DIAGNOSIS — R600 Localized edema: Secondary | ICD-10-CM | POA: Diagnosis not present

## 2019-04-09 DIAGNOSIS — E785 Hyperlipidemia, unspecified: Secondary | ICD-10-CM | POA: Diagnosis not present

## 2019-04-09 DIAGNOSIS — I10 Essential (primary) hypertension: Secondary | ICD-10-CM

## 2019-04-09 DIAGNOSIS — E1169 Type 2 diabetes mellitus with other specified complication: Secondary | ICD-10-CM | POA: Diagnosis not present

## 2019-04-09 NOTE — Assessment & Plan Note (Signed)
Encouraged heart healthy diet, increase exercise, avoid trans fats, consider a krill oil cap daily 

## 2019-04-09 NOTE — Assessment & Plan Note (Signed)
hgba1c acceptable, minimize simple carbs. Increase exercise as tolerated. Continue current meds 

## 2019-04-09 NOTE — Progress Notes (Signed)
Virtual Visit via  Telephone Note  I connected with Heather Roberts on 04/09/19 at  9:40 AM EDT by a phone enabled telemedicine application and verified that I am speaking with the correct person using two identifiers.  Location: Patient: home Provider: office   I discussed the limitations of evaluation and management by telemedicine and the availability of in person appointments. The patient expressed understanding and agreed to proceed. Magdalene Molly, CMA tried to set patient up on video visit but she was unsuccessful so visit was completed via telephone    Subjective:    Patient ID: Heather Roberts, female    DOB: [birth year], 73 y.o.   MRN: [id number]  No chief complaint on file.   HPI Patient is in today for follow up on hypertension, hyperlipidemia, and diabetes. She had an episode of constipation. She felt bloated and she tried Miralax and Benefiber and after time it normalized and she now has 2 bowel movements daily without any bloody or tarry stool. Drinking plenty of water. Blood sugars generally below 130. Had one at 137 after eating strawberry shortcake. No polyuria or polydipsia most days. She had a couple of days with increased pedal edema when her air unit was out and she was overheated. She had polyuria during those days as well. Denies CP/palp/SOB/HA/congestion/fevers. Taking meds as prescribed  Past Medical History:  Diagnosis Date  . Allergic rhinitis   . Anemia 03/19/2013  . Anxiety   . Bouchard nodes (DJD hand)   . Carotid artery disease ([place]) 03/24/2014  . Cervical cancer screening 07/31/2015  . Dense breast tissue 03/03/2017  . Diabetes mellitus type II   . DJD (degenerative joint disease), lumbar   . Fibroids   . Glaucoma   . H/O diverticulitis of colon 03/03/2017  . History of DVT (deep vein thrombosis) 1994  . Hyperlipidemia   . Hypertension   . Insomnia 02/03/2016  . Nocturia 12/18/2012  . Obesity 07/31/2015  . Palpitations 12/18/2012     Past Surgical History:  Procedure Laterality Date  . ABDOMINAL HYSTERECTOMY    . CHOLECYSTECTOMY    . EYE SURGERY  09/2014, 08/2014   Cataracts removal  . KNEE ARTHROSCOPY Bilateral    Left 2007, Right 1999    Family History  Problem Relation Age of Onset  . Diabetes Mother   . Kidney disease Mother   . COPD Father        smoker  . Stroke Sister   . Breast cancer Sister        s/p mastectomy  . COPD Brother   . Stroke Brother   . Stroke Other   . Diabetes Paternal Grandmother     Social History   Socioeconomic History  . Marital status: Married    Spouse name: Essick  . Number of children: 3  . Years of education: Not on file  . Highest education level: Not on file  Occupational History  . Occupation: Scientist, research (physical sciences): RETIRED    Comment: used to work for [employer]  . Financial resource strain: Not on file  . Food insecurity    Worry: Not on file    Inability: Not on file  . Transportation needs    Medical: Not on file    Non-medical: Not on file  Tobacco Use  . Smoking status: Former Smoker    Packs/day: 0.30    Years: 8.00    Pack years: 2.40    Types: Cigarettes  .  Smokeless tobacco: Never Used  Substance and Sexual Activity  . Alcohol use: Yes    Alcohol/week: 0.0 standard drinks    Comment: Rarely---glass of wine with a meeting  . Drug use: No  . Sexual activity: Yes    Partners: Male    Comment: lives with husband, no dietary restrictions, minimizes dairy is walking more now  Lifestyle  . Physical activity    Days per week: Not on file    Minutes per session: Not on file  . Stress: Not on file  Relationships  . Social Herbalist on phone: Not on file    Gets together: Not on file    Attends religious service: Not on file    Active member of club or organization: Not on file    Attends meetings of clubs or organizations: Not on file    Relationship status: Not on file  . Intimate partner violence     Fear of current or ex partner: Not on file    Emotionally abused: Not on file    Physically abused: Not on file    Forced sexual activity: Not on file  Other Topics Concern  . 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 amLODipine (NORVASC) 10 MG tablet TAKE 1 TABLET(10 MG) BY MOUTH DAILY 90 tablet 0  . aspirin 81 MG tablet Take 81 mg by mouth daily.      Marland Kitchen atorvastatin (LIPITOR) 40 MG tablet Take 1.5 tablets (60 mg total) by mouth daily. 135 tablet 1  . Cyanocobalamin (B-12) 1000 MCG CAPS Take 1 tablet by mouth daily.    . fluticasone (FLONASE) 50 MCG/ACT nasal spray Place 2 sprays into both nostrils daily as needed for allergies or rhinitis. 16 g 6  . glucose blood (GLUCOSE METER TEST) test strip To use with onetouch verio flex DX E11.9 100 each 6  . glucose blood (ONETOUCH VERIO) test strip Check blood sugar once daily.  DX E11.9 100 each 6  . hydrochlorothiazide (HYDRODIURIL) 12.5 MG tablet Take 1 tablet (12.5 mg total) by mouth daily. 30 tablet 5  . losartan (COZAAR) 100 MG tablet Take 1 tablet (100 mg total) by mouth daily. 90 tablet 1  . metFORMIN (GLUCOPHAGE-XR) 500 MG 24 hr tablet TAKE 1 TABLET(500 MG) BY MOUTH TWICE DAILY WITH MEALS 180 tablet 1  . metoprolol succinate (TOPROL-XL) 25 MG 24 hr tablet Take 25 mg by mouth as directed. 1/2 tablet daily with dinner    . mometasone (NASONEX) 50 MCG/ACT nasal spray Place 2 sprays into the nose daily.    . nitroGLYCERIN (NITROSTAT) 0.4 MG SL tablet Place 1 tablet (0.4 mg total) under the tongue every 5 (five) minutes as needed for chest pain. 25 tablet 1  . tiZANidine (ZANAFLEX) 2 MG tablet TAKE 1 TABLET(2 MG) BY MOUTH AT BEDTIME AS NEEDED FOR MUSCLE SPASMS 90 tablet 1   No facility-administered medications prior to  visit.     Allergies  Allergen Reactions  . Tussionex Pennkinetic Er [Hydrocod Polst-Cpm Polst Er] Other (See Comments)    Upset stomach and loose stool     Review of Systems  Constitutional: Negative for fever  and malaise/fatigue.  HENT: Negative for congestion.   Eyes: Negative for blurred vision.  Respiratory: Negative for shortness of breath.   Cardiovascular: Negative for chest pain, palpitations and leg swelling.  Gastrointestinal: Negative for abdominal pain, blood in stool and nausea.  Genitourinary: Negative for dysuria and frequency.  Musculoskeletal: Negative for falls.  Skin: Negative for rash.  Neurological: Negative for dizziness, loss of consciousness and headaches.  Endo/Heme/Allergies: Negative for environmental allergies.  Psychiatric/Behavioral: Negative for depression. The patient is not nervous/anxious.        Objective:    Physical Exam  Unable to obtain via telephone visit There were no vitals taken for this visit. Wt Readings from Last 3 Encounters:  02/05/19 177 lb 6.4 oz (80.5 kg)  12/27/18 177 lb 12.8 oz (80.6 kg)  08/29/18 173 lb (78.5 kg)    Diabetic Foot Exam - Simple   No data filed     Lab Results  Component Value Date   WBC 6.0 08/07/2018   HGB 12.9 08/07/2018   HCT 38.7 08/07/2018   PLT 262.0 08/07/2018   GLUCOSE 111 (H) 08/07/2018   CHOL 123 08/07/2018   TRIG 94.0 08/07/2018   HDL 57.40 08/07/2018   LDLDIRECT 118.5 10/04/2007   LDLCALC 46 08/07/2018   ALT 18 08/07/2018   AST 23 08/07/2018   NA 139 08/07/2018   K 4.0 08/07/2018   CL 103 08/07/2018   CREATININE 0.89 08/07/2018   BUN 13 08/07/2018   CO2 26 08/07/2018   TSH 1.51 08/07/2018   HGBA1C 6.7 (H) 08/07/2018   MICROALBUR 0.50 06/14/2012    Lab Results  Component Value Date   TSH 1.51 08/07/2018   Lab Results  Component Value Date   WBC 6.0 08/07/2018   HGB 12.9 08/07/2018   HCT 38.7 08/07/2018   MCV 92.3 08/07/2018   PLT 262.0 08/07/2018   Lab Results   Component Value Date   NA 139 08/07/2018   K 4.0 08/07/2018   CO2 26 08/07/2018   GLUCOSE 111 (H) 08/07/2018   BUN 13 08/07/2018   CREATININE 0.89 08/07/2018   BILITOT 1.0 08/07/2018   ALKPHOS 113 08/07/2018   AST 23 08/07/2018   ALT 18 08/07/2018   PROT 7.5 08/07/2018   ALBUMIN 4.3 08/07/2018   CALCIUM 9.8 08/07/2018   GFR 80.15 08/07/2018   Lab Results  Component Value Date   CHOL 123 08/07/2018   Lab Results  Component Value Date   HDL 57.40 08/07/2018   Lab Results  Component Value Date   LDLCALC 46 08/07/2018   Lab Results  Component Value Date   TRIG 94.0 08/07/2018   Lab Results  Component Value Date   CHOLHDL 2 08/07/2018   Lab Results  Component Value Date   HGBA1C 6.7 (H) 08/07/2018       Assessment & Plan:   Problem List Items Addressed This Visit    Diabetes mellitus type 2 in obese ([place])    hgba1c acceptable, minimize simple carbs. Increase exercise as tolerated. Continue current meds      Hyperlipidemia associated with type 2 diabetes mellitus ([place])    Encouraged heart healthy diet, increase exercise, avoid trans fats, consider a krill oil cap daily      Essential hypertension     no changes to meds. Encouraged heart healthy diet such as the DASH diet and exercise as tolerated.       Pedal edema    Was bad in both legs when she was over heated  when her air unit was out. Minimize sodium, elevate feet, stay cool.          I am having Heather Roberts. Garfield Cornea maintain her aspirin, fluticasone, B-12, glucose blood, glucose blood, mometasone, hydrochlorothiazide, metoprolol succinate, losartan, tiZANidine, amLODipine, nitroGLYCERIN, atorvastatin, and metFORMIN.  No orders of the defined types were placed in this encounter.   I discussed the assessment and treatment plan with the patient. The patient was provided an opportunity to ask questions and all were answered. The patient agreed with the plan and demonstrated an understanding of  the instructions.   The patient was advised to call back or seek an in-person evaluation if the symptoms worsen or if the condition fails to improve as anticipated.  I provided 25 minutes of non-face-to-face time during this encounter.   [name], MD

## 2019-04-09 NOTE — Assessment & Plan Note (Signed)
Was bad in both legs when she was over heated when her air unit was out. Minimize sodium, elevate feet, stay cool.

## 2019-04-09 NOTE — Assessment & Plan Note (Signed)
no changes to meds. Encouraged heart healthy diet such as the DASH diet and exercise as tolerated.  

## 2019-06-29 ENCOUNTER — Other Ambulatory Visit: Payer: Self-pay

## 2019-06-29 ENCOUNTER — Encounter: Payer: Self-pay | Admitting: Family Medicine

## 2019-06-29 ENCOUNTER — Ambulatory Visit (INDEPENDENT_AMBULATORY_CARE_PROVIDER_SITE_OTHER): Payer: Medicare Other | Admitting: Family Medicine

## 2019-06-29 VITALS — BP 136/85 | Ht 62.0 in | Wt 179.0 lb

## 2019-06-29 DIAGNOSIS — M7581 Other shoulder lesions, right shoulder: Secondary | ICD-10-CM

## 2019-06-29 MED ORDER — PREDNISONE 5 MG PO TABS: ORAL_TABLET | ORAL | 0 refills | Status: DC

## 2019-06-29 NOTE — Patient Instructions (Signed)
Nice to meet you Please try ice  Please try the medicine  Please try the exercises   Please send me a message in MyChart with any questions or updates.  Please see me back in 4 weeks.   --Dr. Raeford Razor

## 2019-06-29 NOTE — Progress Notes (Signed)
Heather Roberts - 73 y.o. female MRN [id number]  Date of birth: Apr 24, 1946  SUBJECTIVE:  Including CC & ROS.  No chief complaint on file.   Heather Roberts is a 73 y.o. female that is presenting with acute right shoulder pain.  The pain is localized to the anterior and posterior aspect of the shoulder.  She has been doing a lot of canning recently and seems to have exacerbated her pain.  The most pain occurred after she was picking up heavy cabbages.  The pain is moderate and intermittent.  Denies any specific inciting event.  Denies any radicular symptoms.  Has tried Tylenol with limited improvement    Review of Systems  Constitutional: Negative for fever.  HENT: Negative for congestion.   Respiratory: Negative for cough.   Cardiovascular: Negative for chest pain.  Gastrointestinal: Negative for abdominal pain.  Musculoskeletal: Negative for gait problem.  Skin: Negative for color change.  Neurological: Negative for weakness.  Hematological: Negative for adenopathy.    HISTORY: Past Medical, Surgical, Social, and Family History Reviewed & Updated per EMR.   Pertinent Historical Findings include:  Past Medical History:  Diagnosis Date  . Allergic rhinitis   . Anemia 03/19/2013  . Anxiety   . Bouchard nodes (DJD hand)   . Carotid artery disease ([place]) 03/24/2014  . Cervical cancer screening 07/31/2015  . Dense breast tissue 03/03/2017  . Diabetes mellitus type II   . DJD (degenerative joint disease), lumbar   . Fibroids   . Glaucoma   . H/O diverticulitis of colon 03/03/2017  . History of DVT (deep vein thrombosis) 1994  . Hyperlipidemia   . Hypertension   . Insomnia 02/03/2016  . Nocturia 12/18/2012  . Obesity 07/31/2015  . Palpitations 12/18/2012    Past Surgical History:  Procedure Laterality Date  . ABDOMINAL HYSTERECTOMY    . CHOLECYSTECTOMY    . EYE SURGERY  09/2014, 08/2014   Cataracts removal  . KNEE ARTHROSCOPY Bilateral    Left 2007, Right 1999     Allergies  Allergen Reactions  . Tussionex Pennkinetic Er [Hydrocod Polst-Cpm Polst Er] Other (See Comments)    Upset stomach and loose stool     Family History  Problem Relation Age of Onset  . Diabetes Mother   . Kidney disease Mother   . COPD Father        smoker  . Stroke Sister   . Breast cancer Sister        s/p mastectomy  . COPD Brother   . Stroke Brother   . Stroke Other   . Diabetes Paternal Grandmother      Social History   Socioeconomic History  . Marital status: Married    Spouse name: Essick  . Number of children: 3  . Years of education: Not on file  . Highest education level: Not on file  Occupational History  . Occupation: Scientist, research (physical sciences): RETIRED    Comment: used to work for [employer]  . Financial resource strain: Not on file  . Food insecurity    Worry: Not on file    Inability: Not on file  . Transportation needs    Medical: Not on file    Non-medical: Not on file  Tobacco Use  . Smoking status: Former Smoker    Packs/day: 0.30    Years: 8.00    Pack years: 2.40    Types: Cigarettes  . Smokeless tobacco: Never Used  Substance  and Sexual Activity  . Alcohol use: Yes    Alcohol/week: 0.0 standard drinks    Comment: Rarely---glass of wine with a meeting  . Drug use: No  . Sexual activity: Yes    Partners: Male    Comment: lives with husband, no dietary restrictions, minimizes dairy is walking more now  Lifestyle  . Physical activity    Days per week: Not on file    Minutes per session: Not on file  . Stress: Not on file  Relationships  . Social Herbalist on phone: Not on file    Gets together: Not on file    Attends religious service: Not on file    Active member of club or organization: Not on file    Attends meetings of clubs or organizations: Not on file    Relationship status: Not on file  . Intimate partner violence    Fear of current or ex partner: Not on file    Emotionally  abused: Not on file    Physically abused: Not on file    Forced sexual activity: Not on file  Other Topics Concern  . 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 Physical Exam Gen: NAD, alert, cooperative with exam, well-appearing ENT: normal lips, normal nasal mucosa,  Eye: normal EOM, normal conjunctiva and lids CV:  no edema, +2 pedal pulses   Resp: no accessory muscle use, non-labored,   Skin: no rashes, no areas of induration  Neuro: normal tone, normal sensation to touch Psych:  normal insight, alert and oriented MSK:  Right shoulder: Normal active flexion abduction. Normal internal and external rotation. Some pain with empty can testing. Negative Hawkin's test. Neurovascular intact     ASSESSMENT & PLAN:   Tendinitis of right rotator cuff Symptoms seem most consistent with tendinitis with repetitive canning and chopping that she has been doing. -Prednisone. -Counseled on home exercise therapy and supportive care. -If no improvement will consider an injection or imaging.

## 2019-06-29 NOTE — Assessment & Plan Note (Signed)
Symptoms seem most consistent with tendinitis with repetitive canning and chopping that she has been doing. -Prednisone. -Counseled on home exercise therapy and supportive care. -If no improvement will consider an injection or imaging.

## 2019-07-13 ENCOUNTER — Telehealth: Payer: Self-pay | Admitting: Family Medicine

## 2019-07-13 ENCOUNTER — Other Ambulatory Visit: Payer: Self-pay | Admitting: Family Medicine

## 2019-07-13 DIAGNOSIS — I10 Essential (primary) hypertension: Secondary | ICD-10-CM

## 2019-07-13 MED ORDER — AMLODIPINE BESYLATE 10 MG PO TABS: ORAL_TABLET | ORAL | 0 refills | Status: DC

## 2019-07-13 NOTE — Telephone Encounter (Signed)
Patient is calling to schedule AWV. Please advise CB- [phone]

## 2019-07-13 NOTE — Telephone Encounter (Signed)
appt scheduled 08/30/2019

## 2019-07-13 NOTE — Telephone Encounter (Signed)
Medication: amLODipine (NORVASC) 10 MG tablet [id number]   Has the patient contacted their pharmacy? Yes  (Agent: If no, request that the patient contact the pharmacy for the refill.) (Agent: If yes, when and what did the pharmacy advise?)  Preferred Pharmacy (with phone number or street name): [id number] - [place], [address] AT [address] [phone] (Phone) [phone] (Fax)    Agent: Please be advised that RX refills may take up to 3 business days. We ask that you follow-up with your pharmacy.

## 2019-07-19 ENCOUNTER — Ambulatory Visit (INDEPENDENT_AMBULATORY_CARE_PROVIDER_SITE_OTHER): Payer: Medicare Other

## 2019-07-19 ENCOUNTER — Other Ambulatory Visit: Payer: Self-pay

## 2019-07-19 DIAGNOSIS — Z23 Encounter for immunization: Secondary | ICD-10-CM

## 2019-08-29 ENCOUNTER — Ambulatory Visit (INDEPENDENT_AMBULATORY_CARE_PROVIDER_SITE_OTHER): Payer: Medicare Other | Admitting: Medical

## 2019-08-29 ENCOUNTER — Other Ambulatory Visit: Payer: Self-pay

## 2019-08-29 ENCOUNTER — Encounter: Payer: Self-pay | Admitting: Medical

## 2019-08-29 VITALS — BP 153/67 | HR 71 | Temp 97.3°F | Resp 16 | Ht 62.0 in | Wt 180.0 lb

## 2019-08-29 DIAGNOSIS — M546 Pain in thoracic spine: Secondary | ICD-10-CM | POA: Diagnosis not present

## 2019-08-29 MED ORDER — MELOXICAM 7.5 MG PO TABS: 7.5000 mg | ORAL_TABLET | Freq: Every day | ORAL | 0 refills | Status: DC

## 2019-08-29 MED ORDER — KETOROLAC TROMETHAMINE 60 MG/2ML IM SOLN
60.0000 mg | Freq: Once | INTRAMUSCULAR | Status: AC
  Administered 2019-08-29: 60 mg via INTRAMUSCULAR

## 2019-08-29 MED ORDER — CYCLOBENZAPRINE HCL 5 MG PO TABS: 5.0000 mg | ORAL_TABLET | Freq: Every day | ORAL | 0 refills | Status: DC

## 2019-08-29 NOTE — Progress Notes (Signed)
Subjective:    Patient ID: Heather Roberts, female    DOB: 02-16-1946, 73 y.o.   MRN: [id number]  HPI Pt states that she has some rt upper back that started yesterday after picking 76 bushels of turnip greens and loading them on truck. She states curtain way that she moves will cause pain. No flanl/abdomen pain.  No urinary symptoms.   No hx of chronic back pain.  She felt pain on set lifting first bushel. Then felt on second bushel as well.  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  When she moves pain is 8/10. Worse when bends. Twisting back to left hurts more.     Review of Systems  Constitutional: Negative for chills, fatigue and fever.  Respiratory: Negative for cough, chest tightness, shortness of breath and wheezing.   Cardiovascular: Negative for chest pain and palpitations.  Gastrointestinal: Negative for abdominal pain, blood in stool, nausea and vomiting.  Genitourinary: Negative for difficulty urinating, dysuria, flank pain, hematuria and urgency.  Musculoskeletal: Positive for back pain. Negative for joint swelling, neck pain and neck stiffness.  Skin: Negative for rash.  Neurological: Negative for dizziness, seizures and light-headedness.  Hematological: Negative for adenopathy. Does not bruise/bleed easily.  Psychiatric/Behavioral: Negative for behavioral problems and decreased concentration.    Past Medical History:  Diagnosis Date  . Allergic rhinitis   . Anemia 03/19/2013  . Anxiety   . Bouchard nodes (DJD hand)   . Carotid artery disease ([place]) 03/24/2014  . Cervical cancer screening 07/31/2015  . Dense breast tissue 03/03/2017  . Diabetes mellitus type II   . DJD (degenerative joint disease), lumbar   . Fibroids   . Glaucoma   . H/O diverticulitis of colon 03/03/2017  . History of DVT (deep vein thrombosis) 1994  . Hyperlipidemia   . Hypertension   . Insomnia 02/03/2016  . Nocturia 12/18/2012  . Obesity 07/31/2015  . Palpitations 12/18/2012      Social History   Socioeconomic History  . Marital status: Married    Spouse name: Essick  . Number of children: 3  . Years of education: Not on file  . Highest education level: Not on file  Occupational History  . Occupation: Scientist, research (physical sciences): RETIRED    Comment: used to work for [employer]  . Financial resource strain: Not on file  . Food insecurity    Worry: Not on file    Inability: Not on file  . Transportation needs    Medical: Not on file    Non-medical: Not on file  Tobacco Use  . Smoking status: Former Smoker    Packs/day: 0.30    Years: 8.00    Pack years: 2.40    Types: Cigarettes  . Smokeless tobacco: Never Used  Substance and Sexual Activity  . Alcohol use: Yes    Alcohol/week: 0.0 standard drinks    Comment: Rarely---glass of wine with a meeting  . Drug use: No  . Sexual activity: Yes    Partners: Male    Comment: lives with husband, no dietary restrictions, minimizes dairy is walking more now  Lifestyle  . Physical activity    Days per week: Not on file    Minutes per session: Not on file  . Stress: Not on file  Relationships  . Social Herbalist on phone: Not on file    Gets together: Not on file    Attends religious service: Not on file  Active member of club or organization: Not on file    Attends meetings of clubs or organizations: Not on file    Relationship status: Not on file  . Intimate partner violence    Fear of current or ex partner: Not on file    Emotionally abused: Not on file    Physically abused: Not on file    Forced sexual activity: Not on file  Other Topics Concern  . 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 ABDOMINAL HYSTERECTOMY    . CHOLECYSTECTOMY    . EYE SURGERY  09/2014,  08/2014   Cataracts removal  . KNEE ARTHROSCOPY Bilateral    Left 2007, Right 1999    Family History  Problem Relation Age of Onset  . Diabetes Mother   . Kidney disease Mother   . COPD Father        smoker  . Stroke Sister   . Breast cancer Sister        s/p mastectomy  . COPD Brother   . Stroke Brother   . Stroke Other   . Diabetes Paternal Grandmother     Allergies  Allergen Reactions  . Tussionex Pennkinetic Er [Hydrocod Polst-Cpm Polst Er] Other (See Comments)    Upset stomach and loose stool     Current Outpatient Medications on File Prior to Visit  Medication Sig Dispense Refill  . amLODipine (NORVASC) 10 MG tablet TAKE 1 TABLET(10 MG) BY MOUTH DAILY 90 tablet 0  . aspirin 81 MG tablet Take 81 mg by mouth daily.      Marland Kitchen atorvastatin (LIPITOR) 40 MG tablet Take 1.5 tablets (60 mg total) by mouth daily. 135 tablet 1  . Cyanocobalamin (B-12) 1000 MCG CAPS Take 1 tablet by mouth daily.    . fluticasone (FLONASE) 50 MCG/ACT nasal spray Place 2 sprays into both nostrils daily as needed for allergies or rhinitis. 16 g 6  . glucose blood (GLUCOSE METER TEST) test strip To use with onetouch verio flex DX E11.9 100 each 6  . glucose blood (ONETOUCH VERIO) test strip Check blood sugar once daily.  DX E11.9 100 each 6  . hydrochlorothiazide (HYDRODIURIL) 12.5 MG tablet Take 1 tablet (12.5 mg total) by mouth daily. 30 tablet 5  . losartan (COZAAR) 100 MG tablet Take 1 tablet (100 mg total) by mouth daily. 90 tablet 1  . metFORMIN (GLUCOPHAGE-XR) 500 MG 24 hr tablet TAKE 1 TABLET(500 MG) BY MOUTH TWICE DAILY WITH MEALS 180 tablet 1  . metoprolol succinate (TOPROL-XL) 25 MG 24 hr tablet Take 25 mg by mouth as directed. 1/2 tablet daily with dinner    . mometasone (NASONEX) 50 MCG/ACT nasal spray Place 2 sprays into the nose daily.    . nitroGLYCERIN (NITROSTAT) 0.4 MG SL tablet Place 1 tablet (0.4 mg total) under the tongue every 5 (five) minutes as needed for chest pain. 25 tablet 1    No current facility-administered medications on file prior to visit.     BP (!) 153/67   Pulse 71   Temp (!) 97.3 F (36.3 C) (Temporal)   Resp 16   Ht 5\' 2"  (1.575 m)   Wt 180 lb (81.6 kg)  SpO2 99%   BMI 32.92 kg/m       Objective:   Physical Exam  General Mental Status- Alert. General Appearance- Not in acute distress.   Skin General: Color- Normal Color. Moisture- Normal Moisture.  Neck Carotid Arteries- Normal color. Moisture- Normal Moisture. No carotid bruits. No JVD.  Chest and Lung Exam Auscultation: Breath Sounds:-Normal.  Cardiovascular Auscultation:Rythm- Regular. Murmurs & Other Heart Sounds:Auscultation of the heart reveals- No Murmurs.  Abdomen Inspection:-Inspeection Normal. Palpation/Percussion:Note:No mass. Palpation and Percussion of the abdomen reveal- Non Tender, Non Distended + BS, no rebound or guarding.    Neurologic Cranial Nerve exam:- CN III-XII intact(No nystagmus), symmetric smile. Strength:- 5/5 equal and symmetric strength both upper and lower extremities.  Back- no mid spine pain. On palpation. Rt lower rib region mild pain on palpation posterior aspect but more pain obvious in this area on twisting to the left, bending over and sitting up from supine position.   Skin- no rash rt flake are. No cva tenderness.      Assessment & Plan:  You do appear to have muscle type pain in posterior rt lower rib region. Pain onset lifting 55 lb bushels. We gave you toradol 60 mg im in office today. Tomorrow afternoon can start meloxicam 7.5 mg daily. Will rx low dose flexeril to use over next 5 nights.  If pain worsens or changes let me know.  Can use lidocaine otc patch to area. Or if you want to try warm compress to area that may help as well.  Follow up 5-7 days or as needed  By exam and review msk type pain. Not renal colic and not shingles like.   25 minutes spent with pt. 50% of time spent counseling pt on plan going forward.

## 2019-08-29 NOTE — Progress Notes (Signed)
Virtual Visit via Video Note  I connected with patient on 08/30/19 at 10:15 AM EST by audio enabled telemedicine application and verified that I am speaking with the correct person using two identifiers.   THIS ENCOUNTER IS A VIRTUAL VISIT DUE TO COVID-19 - PATIENT WAS NOT SEEN IN THE OFFICE. PATIENT HAS CONSENTED TO VIRTUAL VISIT / TELEMEDICINE VISIT   Location of patient: home  Location of provider: office  I discussed the limitations of evaluation and management by telemedicine and the availability of in person appointments. The patient expressed understanding and agreed to proceed.   Subjective:   Heather Roberts is a 73 y.o. female who presents for Medicare Annual (Subsequent) preventive examination.  Pt is super positive and energetic!  Review of Systems:    Home Safety/Smoke Alarms: Feels safe in home. Smoke alarms in place.  Lives w/ husband in tri level home.   Female:         Mammo-  12/22/18     Dexa scan-  declines      CCS-  declines  Objective:     Vitals: Unable to assess. This visit is enabled though telemedicine due to Covid 19.   Advanced Directives 08/30/2019 09/01/2017 07/21/2017 05/21/2017 10/17/2016 04/13/2016 12/31/2015  Does Patient Have a Medical Advance Directive? Yes Yes Yes Yes Yes Yes No  Type of Paramedic of [place];Living will [place];Living will [place];Living will Healthcare Power of [place] of [place] -  Does patient want to make changes to medical advance directive? No - Patient declined - - - - No - Patient declined -  Copy of [place] in Chart? Yes - validated most recent copy scanned in chart (See row information) Yes - No - copy requested - No - copy requested -  Would patient like information on creating a medical advance directive? - - - - - - No - patient declined information    Tobacco Social  History   Tobacco Use  Smoking Status Former Smoker  . Packs/day: 0.30  . Years: 8.00  . Pack years: 2.40  . Types: Cigarettes  Smokeless Tobacco Never Used     Counseling given: Not Answered   Clinical Intake: Pain : No/denies pain     Past Medical History:  Diagnosis Date  . Allergic rhinitis   . Anemia 03/19/2013  . Anxiety   . Bouchard nodes (DJD hand)   . Carotid artery disease ([place]) 03/24/2014  . Cervical cancer screening 07/31/2015  . Dense breast tissue 03/03/2017  . Diabetes mellitus type II   . DJD (degenerative joint disease), lumbar   . Fibroids   . Glaucoma   . H/O diverticulitis of colon 03/03/2017  . History of DVT (deep vein thrombosis) 1994  . Hyperlipidemia   . Hypertension   . Insomnia 02/03/2016  . Nocturia 12/18/2012  . Obesity 07/31/2015  . Palpitations 12/18/2012   Past Surgical History:  Procedure Laterality Date  . ABDOMINAL HYSTERECTOMY    . CHOLECYSTECTOMY    . EYE SURGERY  09/2014, 08/2014   Cataracts removal  . KNEE ARTHROSCOPY Bilateral    Left 2007, Right 1999   Family History  Problem Relation Age of Onset  . Diabetes Mother   . Kidney disease Mother   . COPD Father        smoker  . Stroke Sister   . Breast cancer Sister  s/p mastectomy  . COPD Brother   . Stroke Brother   . Stroke Other   . Diabetes Paternal Grandmother    Social History   Socioeconomic History  . Marital status: Married    Spouse name: Essick  . Number of children: 3  . Years of education: Not on file  . Highest education level: Not on file  Occupational History  . Occupation: Scientist, research (physical sciences): RETIRED    Comment: used to work for [employer]  . Financial resource strain: Not on file  . Food insecurity    Worry: Not on file    Inability: Not on file  . Transportation needs    Medical: Not on file    Non-medical: Not on file  Tobacco Use  . Smoking status: Former Smoker    Packs/day: 0.30    Years: 8.00     Pack years: 2.40    Types: Cigarettes  . Smokeless tobacco: Never Used  Substance and Sexual Activity  . Alcohol use: Yes    Alcohol/week: 0.0 standard drinks    Comment: Rarely---glass of wine with a meeting  . Drug use: No  . Sexual activity: Yes    Partners: Male    Comment: lives with husband, no dietary restrictions, minimizes dairy is walking more now  Lifestyle  . Physical activity    Days per week: Not on file    Minutes per session: Not on file  . Stress: Not on file  Relationships  . Social Herbalist on phone: Not on file    Gets together: Not on file    Attends religious service: Not on file    Active member of club or organization: Not on file    Attends meetings of clubs or organizations: Not on file    Relationship status: Not on file  Other Topics Concern  . 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 amLODipine (NORVASC) 10 MG tablet TAKE 1 TABLET(10 MG) BY MOUTH DAILY  . aspirin 81 MG tablet Take 81 mg by mouth daily.    Marland Kitchen atorvastatin (LIPITOR) 40 MG tablet Take 1.5 tablets (60 mg total) by mouth daily.  . Cyanocobalamin (B-12) 1000 MCG CAPS Take 1 tablet by mouth daily.  . cyclobenzaprine (FLEXERIL) 5 MG tablet Take 1 tablet (5 mg total) by mouth at bedtime.  . fluticasone (FLONASE) 50 MCG/ACT nasal spray Place 2 sprays into both nostrils daily as needed for allergies or rhinitis.  Marland Kitchen glucose blood (GLUCOSE METER TEST) test strip To use with onetouch verio flex DX E11.9  . glucose blood (ONETOUCH VERIO) test strip Check blood sugar once daily.  DX E11.9  . hydrochlorothiazide (HYDRODIURIL) 12.5 MG tablet Take 1 tablet (12.5 mg total) by mouth daily.  Marland Kitchen losartan (COZAAR) 100 MG tablet Take 1 tablet (100 mg total) by mouth daily.  .  meloxicam (MOBIC) 7.5 MG tablet Take 1 tablet (7.5 mg total) by mouth daily.  . metFORMIN (GLUCOPHAGE-XR) 500 MG 24 hr tablet TAKE 1 TABLET(500 MG) BY MOUTH TWICE DAILY WITH MEALS  . metoprolol succinate (TOPROL-XL) 25 MG 24 hr tablet Take 25 mg by  mouth as directed. 1/2 tablet daily with dinner  . mometasone (NASONEX) 50 MCG/ACT nasal spray Place 2 sprays into the nose daily.  . nitroGLYCERIN (NITROSTAT) 0.4 MG SL tablet Place 1 tablet (0.4 mg total) under the tongue every 5 (five) minutes as needed for chest pain.  . [DISCONTINUED] predniSONE (DELTASONE) 5 MG tablet Take 6 pills for first day, 5 pills second day, 4 pills third day, 3 pills fourth day, 2 pills the fifth day, and 1 pill sixth day.  . [DISCONTINUED] tiZANidine (ZANAFLEX) 2 MG tablet TAKE 1 TABLET(2 MG) BY MOUTH AT BEDTIME AS NEEDED FOR MUSCLE SPASMS   No facility-administered encounter medications on file as of 08/30/2019.     Activities of Daily Living In your present state of health, do you have any difficulty performing the following activities: 08/30/2019  Hearing? N  Vision? N  Difficulty concentrating or making decisions? N  Walking or climbing stairs? N  Dressing or bathing? N  Doing errands, shopping? N  Preparing Food and eating ? N  Using the Toilet? N  In the past six months, have you accidently leaked urine? N  Do you have problems with loss of bowel control? N  Managing your Medications? N  Managing your Finances? N  Housekeeping or managing your Housekeeping? N  Some recent data might be hidden    Patient Care Team: Mosie Lukes, MD as PCP - General (Family Medicine) Stanford Breed Denice Bors, MD as Consulting Physician (Cardiology)    Assessment:   This is a routine wellness examination for Naveh. Physical assessment deferred to PCP.  Exercise Activities and Dietary recommendations Current Exercise Habits: Home exercise routine, Type of exercise: yoga;stretching, Time (Minutes): 20, Frequency  (Times/Week): 3, Weekly Exercise (Minutes/Week): 60, Intensity: Mild, Exercise limited by: None identified   Diet (meal preparation, eat out, water intake, caffeinated beverages, dairy products, fruits and vegetables): in general, a "healthy" diet  , well balanced   Goals    . Maintain current health       Fall Risk Fall Risk  08/30/2019 09/05/2018 09/01/2017 03/03/2017 12/02/2015  Falls in the past year? 0 0 No No No  Comment - Emmi Telephone Survey: data to providers prior to load - - -  Number falls in past yr: 0 - - - -  Injury with Fall? 0 - - - -    Depression Screen PHQ 2/9 Scores 08/30/2019 09/01/2017 03/03/2017 12/02/2015  PHQ - 2 Score 0 0 0 0  Exception Documentation - - - -     Cognitive Function Ad8 score reviewed for issues:  Issues making decisions:no  Less interest in hobbies / activities: no  Repeats questions, stories (family complaining):no  Trouble using ordinary gadgets (microwave, computer, phone):no  Forgets the month or year: no  Mismanaging finances: no  Remembering appts:no  Daily problems with thinking and/or memory:no Ad8 score is=0     MMSE - Mini Mental State Exam 09/01/2017 12/02/2015  Orientation to time 5 5  Orientation to Place 5 5  Registration 3 3  Attention/ Calculation 5 5  Recall 3 3  Language- name 2 objects 2 2  Language- repeat 1 1  Language- follow 3 step command 3 3  Language- read & follow direction 1 1  Write a sentence 1 1  Copy design 1 1  Total score 30 30        Immunization History  Administered Date(s) Administered  . Fluad Quad(high Dose 65+) 07/19/2019  . Influenza Split 06/23/2012  .  Influenza Whole 08/09/2008, 07/31/2009, 06/19/2010  . Influenza, High Dose Seasonal PF 07/08/2016, 07/27/2017, 08/07/2018  . Influenza,inj,Quad PF,6+ Mos 06/25/2013, 07/22/2014, 07/31/2015  . Pneumococcal Conjugate-13 02/03/2016  . Pneumococcal Polysaccharide-23 08/06/2011  . Td 12/16/2004, 09/02/2016  . Zoster  01/03/2007    Screening Tests Health Maintenance  Topic Date Due  . FOOT EXAM  12/01/2016  . HEMOGLOBIN A1C  02/06/2019  . Fecal DNA (Cologuard)  02/09/2019  . OPHTHALMOLOGY EXAM  02/15/2019  . MAMMOGRAM  12/21/2020  . TETANUS/TDAP  09/02/2026  . INFLUENZA VACCINE  Completed  . DEXA SCAN  Completed  . Hepatitis C Screening  Completed  . PNA vac Low Risk Adult  Completed      Plan:    Please schedule your next medicare wellness visit with me in 1 yr.  Continue to eat heart healthy diet (full of fruits, vegetables, whole grains, lean protein, water--limit salt, fat, and sugar intake) and increase physical activity as tolerated.  Continue doing brain stimulating activities (puzzles, reading, adult coloring books, staying active) to keep memory sharp.     I have personally reviewed and noted the following in the patient's chart:   . Medical and social history . Use of alcohol, tobacco or illicit drugs  . Current medications and supplements . Functional ability and status . Nutritional status . Physical activity . Advanced directives . List of other physicians . Hospitalizations, surgeries, and ER visits in previous 12 months . Vitals . Screenings to include cognitive, depression, and falls . Referrals and appointments  In addition, I have reviewed and discussed with patient certain preventive protocols, quality metrics, and best practice recommendations. A written personalized care plan for preventive services as well as general preventive health recommendations were provided to patient.     Shela Nevin, [place]  08/30/2019

## 2019-08-29 NOTE — Patient Instructions (Addendum)
You do appear to have muscle type pain in posterior rt lower rib region. Pain onset lifting 55 lb bushels. We gave you toradol 60 mg im in office today. Tomorrow afternoon can start meloxicam 7.5 mg daily. Will rx low dose flexeril to use over next 5 nights.  If pain worsens or changes let me know.  Can use lidocaine otc patch to area. Or if you want to try warm compress to area that may help as well.  Follow up 5-7 days or as needed

## 2019-08-30 ENCOUNTER — Ambulatory Visit (INDEPENDENT_AMBULATORY_CARE_PROVIDER_SITE_OTHER): Payer: Medicare Other | Admitting: *Deleted

## 2019-08-30 ENCOUNTER — Encounter: Payer: Self-pay | Admitting: *Deleted

## 2019-08-30 DIAGNOSIS — Z0000 Encounter for general adult medical examination without abnormal findings: Secondary | ICD-10-CM

## 2019-08-30 NOTE — Patient Instructions (Signed)
Please schedule your next medicare wellness visit with me in 1 yr.  Continue to eat heart healthy diet (full of fruits, vegetables, whole grains, lean protein, water--limit salt, fat, and sugar intake) and increase physical activity as tolerated.  Continue doing brain stimulating activities (puzzles, reading, adult coloring books, staying active) to keep memory sharp.    Heather Roberts , Thank you for taking time to come for your Medicare Wellness Visit. I appreciate your ongoing commitment to your health goals. Please review the following plan we discussed and let me know if I can assist you in the future.   These are the goals we discussed: Goals    . Maintain current health       This is a list of the screening recommended for you and due dates:  Health Maintenance  Topic Date Due  . Complete foot exam   12/01/2016  . Hemoglobin A1C  02/06/2019  . Cologuard (Stool DNA test)  02/09/2019  . Eye exam for diabetics  02/15/2019  . Mammogram  12/21/2020  . Tetanus Vaccine  09/02/2026  . Flu Shot  Completed  . DEXA scan (bone density measurement)  Completed  .  Hepatitis C: One time screening is recommended by Center for Disease Control  (CDC) for  adults born from [birth year] through 1965.   Completed  . Pneumonia vaccines  Completed    Preventive Care 58 Years and Older, Female Preventive care refers to lifestyle choices and visits with your health care provider that can promote health and wellness. This includes:  A yearly physical exam. This is also called an annual well check.  Regular dental and eye exams.  Immunizations.  Screening for certain conditions.  Healthy lifestyle choices, such as diet and exercise. What can I expect for my preventive care visit? Physical exam Your health care provider will check:  Height and weight. These may be used to calculate body mass index (BMI), which is a measurement that tells if you are at a healthy weight.  Heart rate and blood  pressure.  Your skin for abnormal spots. Counseling Your health care provider may ask you questions about:  Alcohol, tobacco, and drug use.  Emotional well-being.  Home and relationship well-being.  Sexual activity.  Eating habits.  History of falls.  Memory and ability to understand (cognition).  Work and work Statistician.  Pregnancy and menstrual history. What immunizations do I need?  Influenza (flu) vaccine  This is recommended every year. Tetanus, diphtheria, and pertussis (Tdap) vaccine  You may need a Td booster every 10 years. Varicella (chickenpox) vaccine  You may need this vaccine if you have not already been vaccinated. Zoster (shingles) vaccine  You may need this after age 90. Pneumococcal conjugate (PCV13) vaccine  One dose is recommended after age 31. Pneumococcal polysaccharide (PPSV23) vaccine  One dose is recommended after age 64. Measles, mumps, and rubella (MMR) vaccine  You may need at least one dose of MMR if you were born in 1957 or later. You may also need a second dose. Meningococcal conjugate (MenACWY) vaccine  You may need this if you have certain conditions. Hepatitis A vaccine  You may need this if you have certain conditions or if you travel or work in places where you may be exposed to hepatitis A. Hepatitis B vaccine  You may need this if you have certain conditions or if you travel or work in places where you may be exposed to hepatitis B. Haemophilus influenzae type b (Hib) vaccine  You may need this if you have certain conditions. You may receive vaccines as individual doses or as more than one vaccine together in one shot (combination vaccines). Talk with your health care provider about the risks and benefits of combination vaccines. What tests do I need? Blood tests  Lipid and cholesterol levels. These may be checked every 5 years, or more frequently depending on your overall health.  Hepatitis C test.  Hepatitis B  test. Screening  Lung cancer screening. You may have this screening every year starting at age 75 if you have a 30-pack-year history of smoking and currently smoke or have quit within the past 15 years.  Colorectal cancer screening. All adults should have this screening starting at age 8 and continuing until age 59. Your health care provider may recommend screening at age 56 if you are at increased risk. You will have tests every 1-10 years, depending on your results and the type of screening test.  Diabetes screening. This is done by checking your blood sugar (glucose) after you have not eaten for a while (fasting). You may have this done every 1-3 years.  Mammogram. This may be done every 1-2 years. Talk with your health care provider about how often you should have regular mammograms.  BRCA-related cancer screening. This may be done if you have a family history of breast, ovarian, tubal, or peritoneal cancers. Other tests  Sexually transmitted disease (STD) testing.  Bone density scan. This is done to screen for osteoporosis. You may have this done starting at age 48. Follow these instructions at home: Eating and drinking  Eat a diet that includes fresh fruits and vegetables, whole grains, lean protein, and low-fat dairy products. Limit your intake of foods with high amounts of sugar, saturated fats, and salt.  Take vitamin and mineral supplements as recommended by your health care provider.  Do not drink alcohol if your health care provider tells you not to drink.  If you drink alcohol: ? Limit how much you have to 0-1 drink a day. ? Be aware of how much alcohol is in your drink. In the [place] one drink equals one 12 oz bottle of beer (355 mL), one 5 oz glass of wine (148 mL), or one 1 oz glass of hard liquor (44 mL). Lifestyle  Take daily care of your teeth and gums.  Stay active. Exercise for at least 30 minutes on 5 or more days each week.  Do not use any products that  contain nicotine or tobacco, such as cigarettes, e-cigarettes, and chewing tobacco. If you need help quitting, ask your health care provider.  If you are sexually active, practice safe sex. Use a condom or other form of protection in order to prevent STIs (sexually transmitted infections).  Talk with your health care provider about taking a low-dose aspirin or statin. What's next?  Go to your health care provider once a year for a well check visit.  Ask your health care provider how often you should have your eyes and teeth checked.  Stay up to date on all vaccines. This information is not intended to replace advice given to you by your health care provider. Make sure you discuss any questions you have with your health care provider. Document Released: 10/31/2015 Document Revised: 09/28/2018 Document Reviewed: 09/28/2018 Elsevier Patient Education  2020 [employer].

## 2019-10-10 ENCOUNTER — Other Ambulatory Visit: Payer: Self-pay | Admitting: Family Medicine

## 2019-10-10 ENCOUNTER — Telehealth: Payer: Self-pay | Admitting: Family Medicine

## 2019-10-10 DIAGNOSIS — I10 Essential (primary) hypertension: Secondary | ICD-10-CM

## 2019-10-22 ENCOUNTER — Other Ambulatory Visit: Payer: Self-pay | Admitting: Family Medicine

## 2019-10-22 IMAGING — DX DG HAND 2V*R*
2 series · 2 of 2 positions shown · non-contrast
Comparison: None.

CLINICAL DATA: Right hand pain and tenderness in the region of the
middle finger and little finger. The patient also has a right middle
finger soft mass. No known injury.

EXAM:
RIGHT HAND - 2 VIEW

[hand pa]
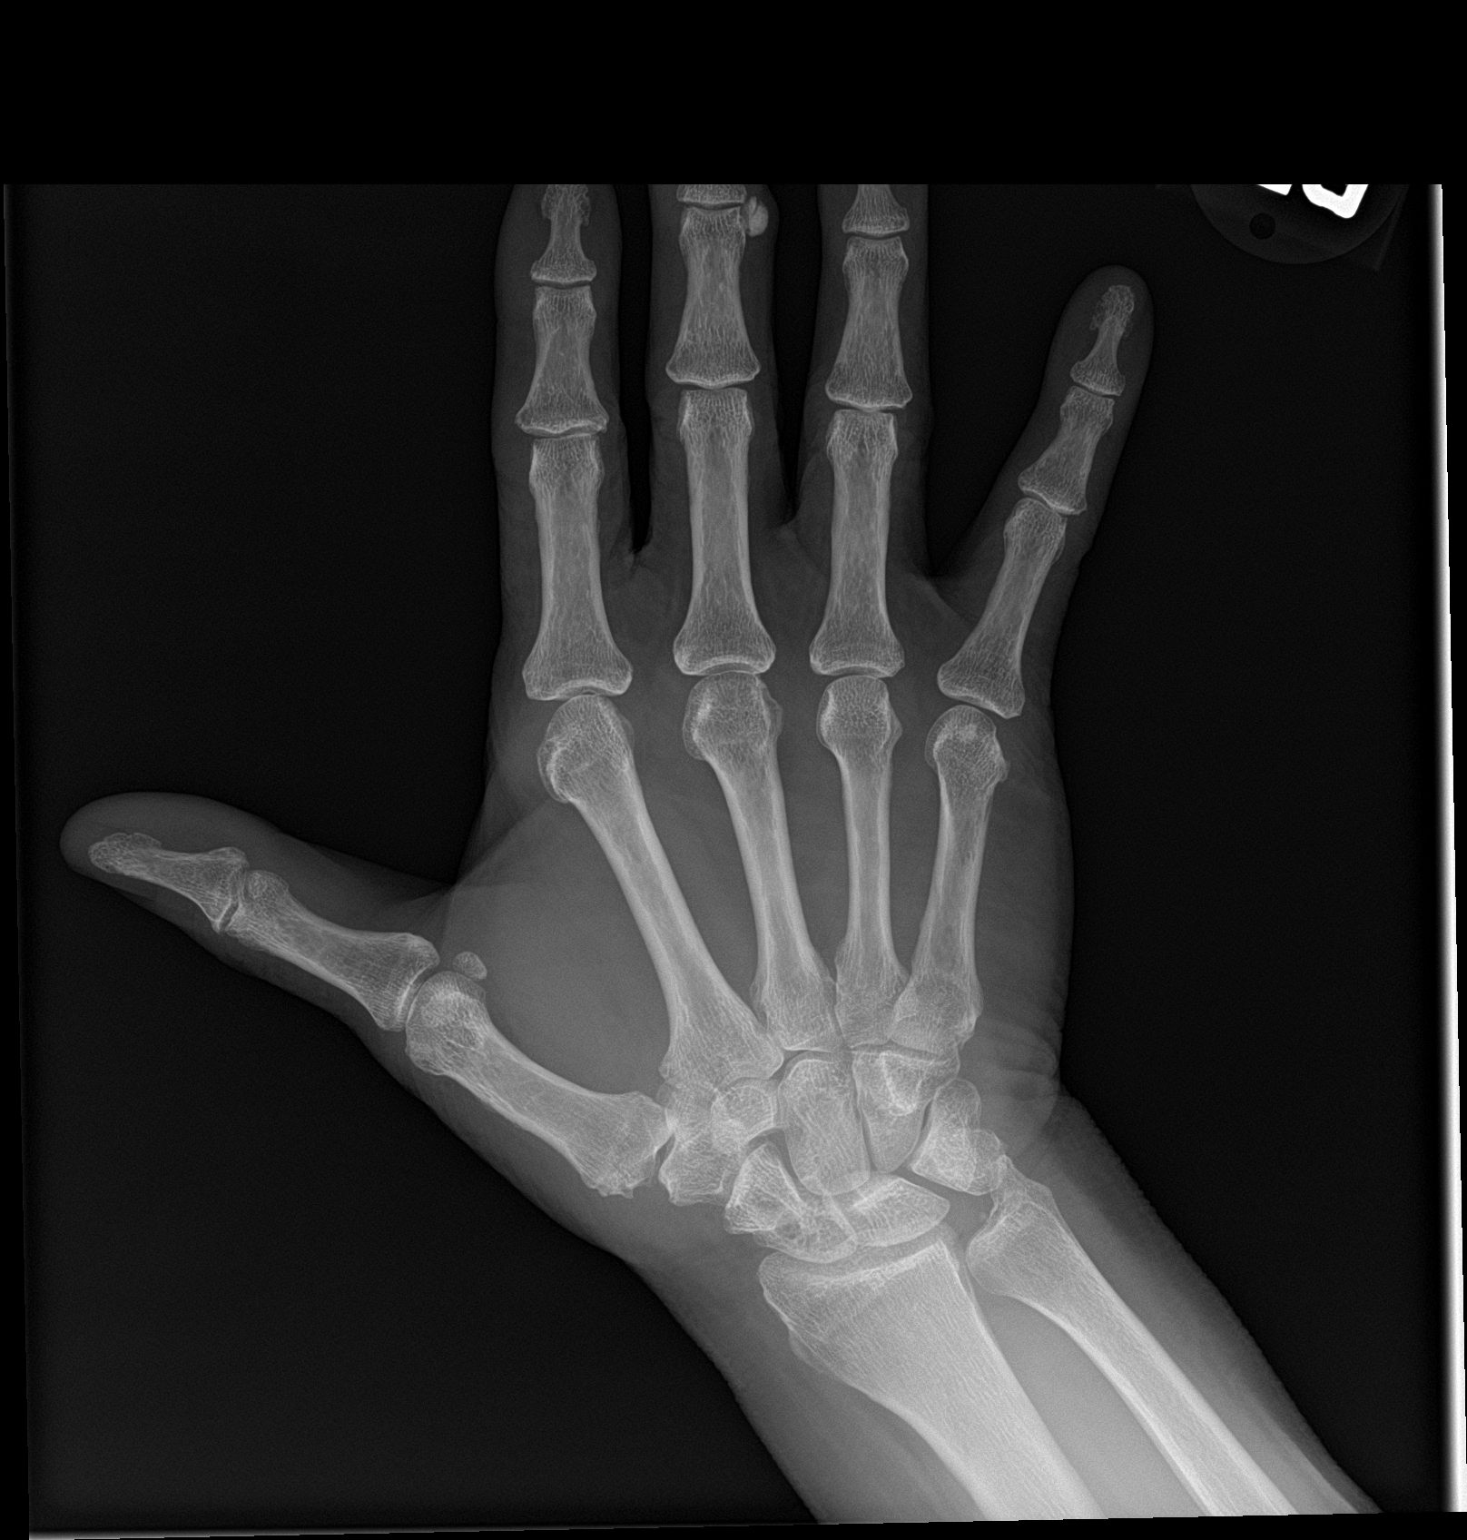

[hand lat]
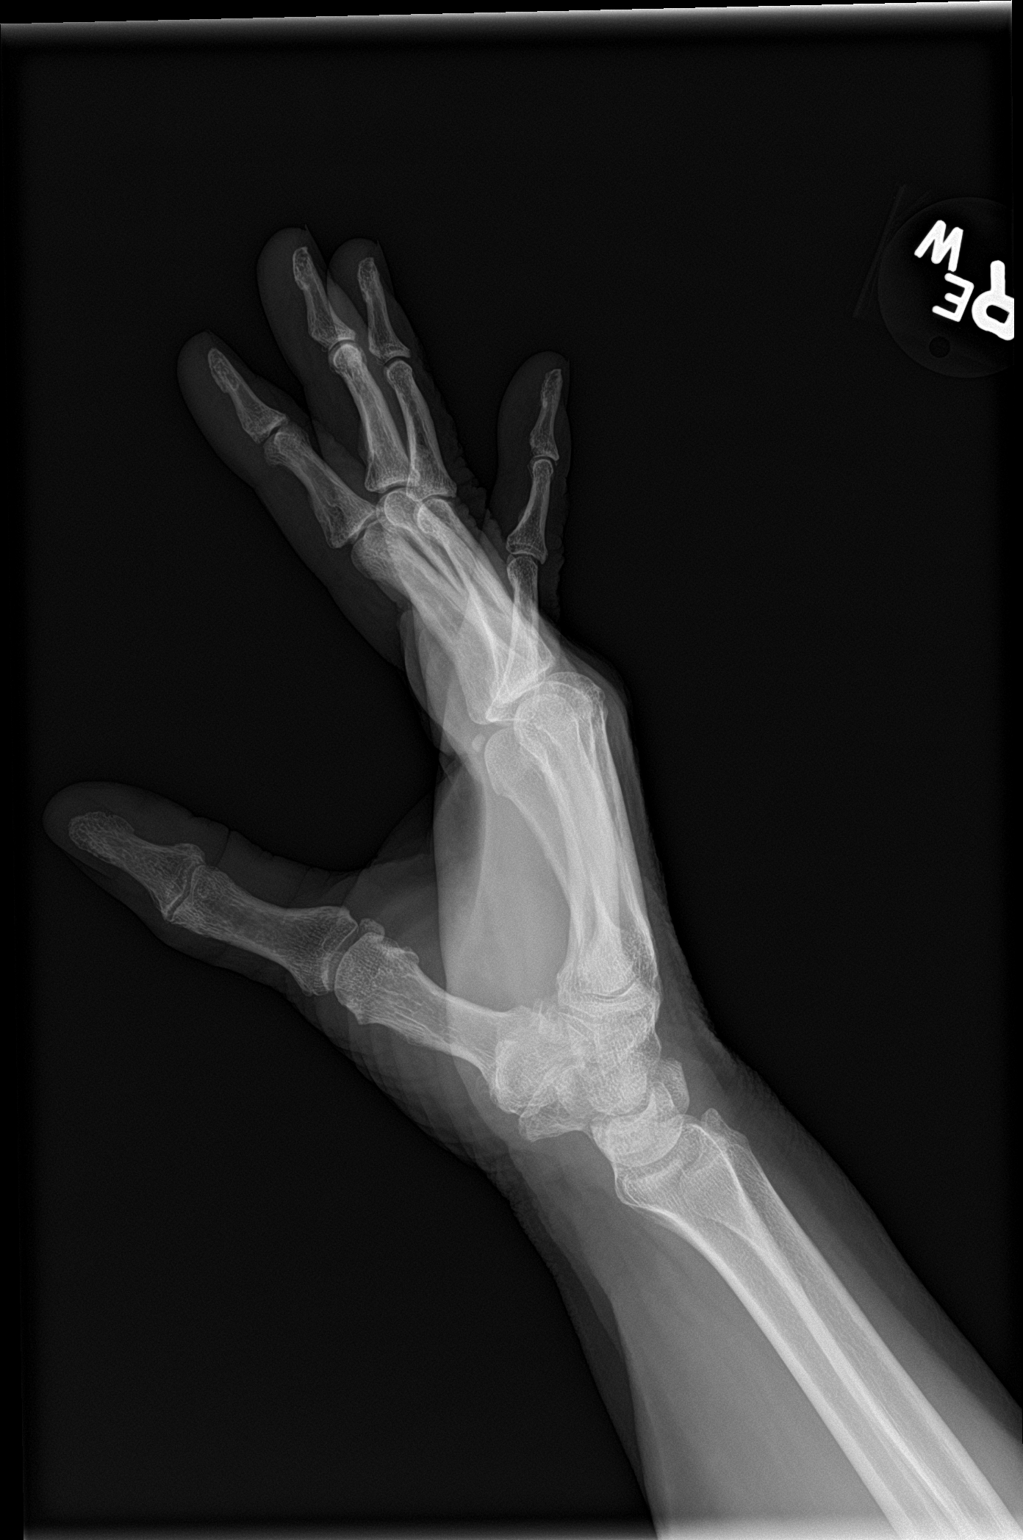

[2 of 2 positions shown; findings below may reference images not displayed]

FINDINGS: 6 x 4 mm oval, circumscribed, para-articular calcification medial to
the 3rd DIP joint. No underlying bone erosion. There is also mild
1st IP joint degenerative spur formation and mild 1st
metacarpal/carpal spur formation. A small cyst is noted in the
scaphoid.
IMPRESSION: 1. Benign periarticular calcification corresponding to the palpable
mass in the right middle finger.
2. Mild degenerative changes.

## 2019-11-12 ENCOUNTER — Other Ambulatory Visit: Payer: Self-pay | Admitting: Family Medicine

## 2019-11-12 DIAGNOSIS — Z1231 Encounter for screening mammogram for malignant neoplasm of breast: Secondary | ICD-10-CM

## 2019-11-15 ENCOUNTER — Other Ambulatory Visit: Payer: Self-pay

## 2019-11-15 ENCOUNTER — Encounter: Payer: Self-pay | Admitting: Family Medicine

## 2019-11-15 ENCOUNTER — Ambulatory Visit (INDEPENDENT_AMBULATORY_CARE_PROVIDER_SITE_OTHER): Payer: Medicare Other | Admitting: Family Medicine

## 2019-11-15 ENCOUNTER — Telehealth: Payer: Self-pay | Admitting: Family Medicine

## 2019-11-15 ENCOUNTER — Ambulatory Visit: Payer: Medicare Other | Admitting: Family Medicine

## 2019-11-15 DIAGNOSIS — E669 Obesity, unspecified: Secondary | ICD-10-CM

## 2019-11-15 DIAGNOSIS — R1310 Dysphagia, unspecified: Secondary | ICD-10-CM | POA: Insufficient documentation

## 2019-11-15 DIAGNOSIS — M25511 Pain in right shoulder: Secondary | ICD-10-CM | POA: Diagnosis not present

## 2019-11-15 DIAGNOSIS — E1169 Type 2 diabetes mellitus with other specified complication: Secondary | ICD-10-CM | POA: Diagnosis not present

## 2019-11-15 DIAGNOSIS — F411 Generalized anxiety disorder: Secondary | ICD-10-CM | POA: Diagnosis not present

## 2019-11-15 DIAGNOSIS — E785 Hyperlipidemia, unspecified: Secondary | ICD-10-CM

## 2019-11-15 NOTE — Assessment & Plan Note (Signed)
Encouraged increased hydration, 64 ounces of clear fluids daily. Minimize alcohol and caffeine. Eat small frequent meals with lean proteins and complex carbs. Avoid high and low blood sugars. Get adequate sleep, 7-8 hours a night. Needs exercise daily preferably in the morning.  

## 2019-11-15 NOTE — Assessment & Plan Note (Signed)
Encouraged heart healthy diet, increase exercise, avoid trans fats, consider a krill oil cap daily, tolerating statin

## 2019-11-15 NOTE — Progress Notes (Signed)
The past 3 months her R shoulder bothering her.

## 2019-11-15 NOTE — Assessment & Plan Note (Signed)
hgba1c acceptable, minimize simple carbs. Increase exercise as tolerated. Continue current meds 

## 2019-11-15 NOTE — Assessment & Plan Note (Signed)
She is managing her stress well during this pandemic. No change to treatment at this time

## 2019-11-15 NOTE — Telephone Encounter (Signed)
Good Evening, Patient called in to give you the following information about her up coming COVID vaccine, she will receive the vaccine on 11/18/2019 @3 :30PM in [facility] on [place].

## 2019-11-15 NOTE — Progress Notes (Signed)
Patient ID: Anaylah Lupercio, female   DOB: 02-May-1946, 74 y.o.   MRN: [id number] Virtual Visit via phone Note  I connected with Tonia Brooms on 11/15/19 at  2:20 PM EST by a phone enabled telemedicine application and verified that I am speaking with the correct person using two identifiers.  Location: Patient: home Provider: office   I discussed the limitations of evaluation and management by telemedicine and the availability of in person appointments. The patient expressed understanding and agreed to proceed. Magdalene Molly, CMA was able to get the patient set up on a visit, phone after being unable to set up a video visit    Subjective:    Patient ID: Tyresha Badenhop, female    DOB: 1946/02/13, 74 y.o.   MRN: [id number]  No chief complaint on file.   HPI Patient is in today for follow up on chronic medical concerns. No recent febrile illness or hospitalizations. She is maintaining quarantine welll and is trying to stay active and eat a heart healthy diet. She continues to have trouble with her right shoulder pain but it is not stopping her from completing her ADLs. Denies CP/palp/SOB/HA/congestion/fevers/GI or GU c/o. Taking meds as prescribed  Past Medical History:  Diagnosis Date  . Allergic rhinitis   . Anemia 03/19/2013  . Anxiety   . Bouchard nodes (DJD hand)   . Carotid artery disease ([place]) 03/24/2014  . Cervical cancer screening 07/31/2015  . Dense breast tissue 03/03/2017  . Diabetes mellitus type II   . DJD (degenerative joint disease), lumbar   . Fibroids   . Glaucoma   . H/O diverticulitis of colon 03/03/2017  . History of DVT (deep vein thrombosis) 1994  . Hyperlipidemia   . Hypertension   . Insomnia 02/03/2016  . Nocturia 12/18/2012  . Obesity 07/31/2015  . Palpitations 12/18/2012    Past Surgical History:  Procedure Laterality Date  . ABDOMINAL HYSTERECTOMY    . CHOLECYSTECTOMY    . EYE SURGERY  09/2014, 08/2014   Cataracts removal    . KNEE ARTHROSCOPY Bilateral    Left 2007, Right 1999    Family History  Problem Relation Age of Onset  . Diabetes Mother   . Kidney disease Mother   . COPD Father        smoker  . Stroke Sister   . Breast cancer Sister        s/p mastectomy  . COPD Brother   . Stroke Brother   . Stroke Other   . Diabetes Paternal Grandmother     Social History   Socioeconomic History  . Marital status: Married    Spouse name: Essick  . Number of children: 3  . Years of education: Not on file  . Highest education level: Not on file  Occupational History  . Occupation: Scientist, research (physical sciences): RETIRED    Comment: used to work for [employer]  . Smoking status: Former Smoker    Packs/day: 0.30    Years: 8.00    Pack years: 2.40    Types: Cigarettes  . Smokeless tobacco: Never Used  Substance and Sexual Activity  . Alcohol use: Yes    Alcohol/week: 0.0 standard drinks    Comment: Rarely---glass of wine with a meeting  . Drug use: No  . Sexual activity: Yes    Partners: Male    Comment: lives with husband, no dietary restrictions, minimizes dairy is walking more now  Other Topics  Concern  . 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 Difficulty of Paying Living Expenses: Not on file  Food Insecurity:   . Worried About Charity fundraiser in the Last Year: Not on file  . Ran Out of Food in the Last Year: Not on file  Transportation Needs:   . Lack of Transportation (Medical): Not on file  . Lack of Transportation (Non-Medical): Not on file  Physical Activity:   . Days of Exercise per Week: Not on file  . Minutes of Exercise per Session: Not on file  Stress:   . Feeling of Stress : Not on file  Social Connections:   . Frequency of Communication  with Friends and Family: Not on file  . Frequency of Social Gatherings with Friends and Family: Not on file  . Attends Religious Services: Not on file  . Active Member of Clubs or Organizations: Not on file  . Attends Archivist Meetings: Not on file  . Marital Status: Not on file  Intimate Partner Violence:   . Fear of Current or Ex-Partner: Not on file  . Emotionally Abused: Not on file  . Physically Abused: Not on file  . Sexually Abused: Not on file    Outpatient Medications Prior to Visit  Medication Sig Dispense Refill  . amLODipine (NORVASC) 10 MG tablet TAKE 1 TABLET BY MOUTH EVERY DAY 90 tablet 0  . aspirin 81 MG tablet Take 81 mg by mouth daily.      Marland Kitchen atorvastatin (LIPITOR) 40 MG tablet TAKE 1.5 TABLETS (60 MG TOTAL) BY MOUTH DAILY. 135 tablet 1  . Cyanocobalamin (B-12) 1000 MCG CAPS Take 1 tablet by mouth daily.    . cyclobenzaprine (FLEXERIL) 5 MG tablet Take 1 tablet (5 mg total) by mouth at bedtime. 5 tablet 0  . fluticasone (FLONASE) 50 MCG/ACT nasal spray Place 2 sprays into both nostrils daily as needed for allergies or rhinitis. 16 g 6  . glucose blood (GLUCOSE METER TEST) test strip To use with onetouch verio flex DX E11.9 100 each 6  . glucose blood (ONETOUCH VERIO) test strip Check blood sugar once daily.  DX E11.9 100 each 6  . hydrochlorothiazide (HYDRODIURIL) 12.5 MG tablet Take 1 tablet (12.5 mg total) by mouth daily. 30 tablet 5  . losartan (COZAAR) 100 MG tablet Take 1 tablet (100 mg total) by mouth daily. 90 tablet 1  . meloxicam (MOBIC) 7.5 MG tablet Take 1 tablet (7.5 mg total) by mouth daily. 10 tablet 0  . metFORMIN (GLUCOPHAGE-XR) 500 MG 24 hr tablet TAKE 1 TABLET(500 MG) BY MOUTH TWICE DAILY WITH MEALS 180 tablet 1  . metoprolol succinate (TOPROL-XL) 25 MG 24 hr tablet Take 25 mg by mouth as directed. 1/2 tablet daily with dinner    . mometasone (NASONEX) 50 MCG/ACT nasal spray Place 2 sprays into the nose daily.    . nitroGLYCERIN  (NITROSTAT) 0.4 MG SL tablet Place 1 tablet (0.4 mg total) under the tongue every 5 (five) minutes as needed for chest pain. 25 tablet 1   No facility-administered medications prior to visit.    Allergies  Allergen Reactions  . Tussionex Pennkinetic Er [  Hydrocod Polst-Cpm Polst Er] Other (See Comments)    Upset stomach and loose stool     Review of Systems  Constitutional: Negative for fever and malaise/fatigue.  HENT: Negative for congestion.   Eyes: Negative for blurred vision.  Respiratory: Negative for shortness of breath.   Cardiovascular: Negative for chest pain, palpitations and leg swelling.  Gastrointestinal: Negative for abdominal pain, blood in stool and nausea.  Genitourinary: Negative for dysuria and frequency.  Musculoskeletal: Positive for joint pain. Negative for falls.  Skin: Negative for rash.  Neurological: Negative for dizziness, loss of consciousness and headaches.  Endo/Heme/Allergies: Negative for environmental allergies.  Psychiatric/Behavioral: Negative for depression. The patient is nervous/anxious.        Objective:    Physical Exam unable to obtain via phone visit.   BP 122/60 (BP Location: Left Arm, Patient Position: Sitting, Cuff Size: Normal)   Wt 178 lb (80.7 kg)   BMI 32.56 kg/m  Wt Readings from Last 3 Encounters:  11/15/19 178 lb (80.7 kg)  08/29/19 180 lb (81.6 kg)  06/29/19 179 lb (81.2 kg)    Diabetic Foot Exam - Simple   No data filed     Lab Results  Component Value Date   WBC 6.0 08/07/2018   HGB 12.9 08/07/2018   HCT 38.7 08/07/2018   PLT 262.0 08/07/2018   GLUCOSE 111 (H) 08/07/2018   CHOL 123 08/07/2018   TRIG 94.0 08/07/2018   HDL 57.40 08/07/2018   LDLDIRECT 118.5 10/04/2007   LDLCALC 46 08/07/2018   ALT 18 08/07/2018   AST 23 08/07/2018   NA 139 08/07/2018   K 4.0 08/07/2018   CL 103 08/07/2018   CREATININE 0.89 08/07/2018   BUN 13 08/07/2018   CO2 26 08/07/2018   TSH 1.51 08/07/2018   HGBA1C 6.7 (H)  08/07/2018   MICROALBUR 0.50 06/14/2012    Lab Results  Component Value Date   TSH 1.51 08/07/2018   Lab Results  Component Value Date   WBC 6.0 08/07/2018   HGB 12.9 08/07/2018   HCT 38.7 08/07/2018   MCV 92.3 08/07/2018   PLT 262.0 08/07/2018   Lab Results  Component Value Date   NA 139 08/07/2018   K 4.0 08/07/2018   CO2 26 08/07/2018   GLUCOSE 111 (H) 08/07/2018   BUN 13 08/07/2018   CREATININE 0.89 08/07/2018   BILITOT 1.0 08/07/2018   ALKPHOS 113 08/07/2018   AST 23 08/07/2018   ALT 18 08/07/2018   PROT 7.5 08/07/2018   ALBUMIN 4.3 08/07/2018   CALCIUM 9.8 08/07/2018   GFR 80.15 08/07/2018   Lab Results  Component Value Date   CHOL 123 08/07/2018   Lab Results  Component Value Date   HDL 57.40 08/07/2018   Lab Results  Component Value Date   LDLCALC 46 08/07/2018   Lab Results  Component Value Date   TRIG 94.0 08/07/2018   Lab Results  Component Value Date   CHOLHDL 2 08/07/2018   Lab Results  Component Value Date   HGBA1C 6.7 (H) 08/07/2018       Assessment & Plan:   Problem List Items Addressed This Visit    Diabetes mellitus type 2 in obese ([place])    hgba1c acceptable, minimize simple carbs. Increase exercise as tolerated. Continue current meds      Hyperlipidemia associated with type 2 diabetes mellitus ([place])    Encouraged heart healthy diet, increase exercise, avoid trans fats, consider a krill oil cap daily, tolerating statin  Anxiety state    She is managing her stress well during this pandemic. No change to treatment at this time      Right shoulder pain    She is doing some better but it does still bother her most days. She was seen by sports med and it was not really helpful. The symptoms are manageable so we will hold off on further referral for now. Encouraged moist heat and gentle stretching as tolerated. May try NSAIDs and prescription meds as directed and report if symptoms worsen or seek immediate care       Dysphagia    Encouraged increased hydration, 64 ounces of clear fluids daily. Minimize alcohol and caffeine. Eat small frequent meals with lean proteins and complex carbs. Avoid high and low blood sugars. Get adequate sleep, 7-8 hours a night. Needs exercise daily preferably in the morning.         I am having Dellena Zemp. Garfield Cornea maintain her aspirin, fluticasone, B-12, glucose blood, glucose blood, mometasone, hydrochlorothiazide, metoprolol succinate, losartan, nitroGLYCERIN, metFORMIN, meloxicam, cyclobenzaprine, amLODipine, and atorvastatin.  No orders of the defined types were placed in this encounter.    I discussed the assessment and treatment plan with the patient. The patient was provided an opportunity to ask questions and all were answered. The patient agreed with the plan and demonstrated an understanding of the instructions.   The patient was advised to call back or seek an in-person evaluation if the symptoms worsen or if the condition fails to improve as anticipated.  I provided 25 minutes of non-face-to-face time during this encounter.   [name], MD

## 2019-11-15 NOTE — Assessment & Plan Note (Signed)
She is doing some better but it does still bother her most days. She was seen by sports med and it was not really helpful. The symptoms are manageable so we will hold off on further referral for now. Encouraged moist heat and gentle stretching as tolerated. May try NSAIDs and prescription meds as directed and report if symptoms worsen or seek immediate care

## 2019-11-18 DIAGNOSIS — Z23 Encounter for immunization: Secondary | ICD-10-CM | POA: Diagnosis not present

## 2019-12-09 DIAGNOSIS — Z23 Encounter for immunization: Secondary | ICD-10-CM | POA: Diagnosis not present

## 2019-12-13 ENCOUNTER — Telehealth: Payer: Self-pay

## 2019-12-13 ENCOUNTER — Telehealth: Payer: Self-pay | Admitting: *Deleted

## 2019-12-13 NOTE — Telephone Encounter (Signed)
[facility] sent [facility] stating that insurance will no longer pay for 1.5 tablet, must use higher dose.  Please advise

## 2019-12-13 NOTE — Telephone Encounter (Signed)
Patient called in to see if Dr. Charlett Blake can send in a prescription for atorvastatin (LIPITOR) 40 MG tablet [id number]    Thanks,

## 2019-12-13 NOTE — Telephone Encounter (Signed)
Will they pay for 3 20 mg tabs?  Also she has nor done labs since 07/2018 please arrange labs and a visit for her to update and see what strength she needs

## 2019-12-14 MED ORDER — ATORVASTATIN CALCIUM 20 MG PO TABS: 60.0000 mg | ORAL_TABLET | Freq: Every day | ORAL | 0 refills | Status: DC

## 2019-12-14 NOTE — Telephone Encounter (Signed)
LMOVM for pt to call office and schedule a lab visit and then a visit with SB to discuss labs then we can send in her medication to last till she is seen/thx dmf

## 2019-12-14 NOTE — Telephone Encounter (Signed)
Good question somewhere in the complicated string of notes across teams, epic chat, my chart etc is a note that addresses just that. She needs labs and an appt after that. She needs a lipid, cmp, cbc, tsh. OK to send in a 30 day supply of the Lipitor to make sure she does not run out while we work on this. Thanks

## 2019-12-14 NOTE — Telephone Encounter (Signed)
SB-Plz advise if refill is ok/has not had labs since 2019/thx dmf

## 2019-12-14 NOTE — Telephone Encounter (Signed)
Rx sent in.  Patient set up for labs on 12/28/19, she has a busy schedule.  Advised in note for pharmacy to let us know if insurance does not cover.

## 2019-12-14 NOTE — Telephone Encounter (Signed)
Also she needs a hgba1c

## 2019-12-24 ENCOUNTER — Ambulatory Visit
Admission: RE | Admit: 2019-12-24 | Discharge: 2019-12-24 | Disposition: A | Discharge status: Home / Self Care | Payer: Medicare Other | Source: Ambulatory Visit | Attending: Family Medicine | Admitting: Family Medicine

## 2019-12-24 ENCOUNTER — Other Ambulatory Visit: Payer: Self-pay

## 2019-12-24 DIAGNOSIS — Z1231 Encounter for screening mammogram for malignant neoplasm of breast: Secondary | ICD-10-CM

## 2019-12-28 ENCOUNTER — Other Ambulatory Visit: Payer: Self-pay

## 2019-12-28 ENCOUNTER — Other Ambulatory Visit (INDEPENDENT_AMBULATORY_CARE_PROVIDER_SITE_OTHER): Payer: Medicare Other

## 2019-12-28 DIAGNOSIS — I10 Essential (primary) hypertension: Secondary | ICD-10-CM

## 2019-12-28 DIAGNOSIS — E785 Hyperlipidemia, unspecified: Secondary | ICD-10-CM

## 2019-12-28 DIAGNOSIS — E669 Obesity, unspecified: Secondary | ICD-10-CM

## 2019-12-28 DIAGNOSIS — E1169 Type 2 diabetes mellitus with other specified complication: Secondary | ICD-10-CM | POA: Diagnosis not present

## 2019-12-28 LAB — LIPID PANEL
Cholesterol: 148 mg/dL (ref 0–200)
HDL: 60.1 mg/dL (ref >39.00)
LDL Cholesterol: 71 mg/dL (ref 0–99)
NonHDL: 87.78
Total CHOL/HDL Ratio: 2
Triglycerides: 86 mg/dL (ref 0.0–149.0)
VLDL: 17.2 mg/dL (ref 0.0–40.0)

## 2019-12-28 LAB — CBC
HCT: 37.4 % (ref 36.0–46.0)
Hemoglobin: 12.6 g/dL (ref 12.0–15.0)
MCHC: 33.5 g/dL (ref 30.0–36.0)
MCV: 94 fl (ref 78.0–100.0)
Platelets: 238 10*3/uL (ref 150.0–400.0)
RBC: 3.98 Mil/uL (ref 3.87–5.11)
RDW: 14.3 % (ref 11.5–15.5)
WBC: 6 10*3/uL (ref 4.0–10.5)

## 2019-12-28 LAB — COMPREHENSIVE METABOLIC PANEL
ALT: 15 U/L (ref 0–35)
AST: 21 U/L (ref 0–37)
Albumin: 4.2 g/dL (ref 3.5–5.2)
Alkaline Phosphatase: 124 U/L — ABNORMAL HIGH (ref 39–117)
BUN: 12 mg/dL (ref 6–23)
CO2: 27 mEq/L (ref 19–32)
Calcium: 9.8 mg/dL (ref 8.4–10.5)
Chloride: 102 mEq/L (ref 96–112)
Creatinine, Ser: 0.96 mg/dL (ref 0.40–1.20)
GFR: 68.83 mL/min (ref >60.00)
Glucose, Bld: 143 mg/dL — ABNORMAL HIGH (ref 70–99)
Potassium: 4.5 mEq/L (ref 3.5–5.1)
Sodium: 137 mEq/L (ref 135–145)
Total Bilirubin: 1 mg/dL (ref 0.2–1.2)
Total Protein: 7.5 g/dL (ref 6.0–8.3)

## 2019-12-28 LAB — HEMOGLOBIN A1C: Hgb A1c MFr Bld: 8.1 % — ABNORMAL HIGH (ref 4.6–6.5)

## 2019-12-28 LAB — TSH: TSH: 1.3 u[IU]/mL (ref 0.35–4.50)

## 2019-12-31 ENCOUNTER — Other Ambulatory Visit: Payer: Self-pay

## 2019-12-31 DIAGNOSIS — E119 Type 2 diabetes mellitus without complications: Secondary | ICD-10-CM

## 2019-12-31 DIAGNOSIS — E1169 Type 2 diabetes mellitus with other specified complication: Secondary | ICD-10-CM

## 2019-12-31 MED ORDER — METFORMIN HCL ER 500 MG PO TB24: 500.0000 mg | ORAL_TABLET | Freq: Three times a day (TID) | ORAL | 5 refills | Status: DC

## 2020-01-05 ENCOUNTER — Other Ambulatory Visit: Payer: Self-pay | Admitting: Family Medicine

## 2020-01-12 ENCOUNTER — Other Ambulatory Visit: Payer: Self-pay | Admitting: Family Medicine

## 2020-01-12 DIAGNOSIS — I10 Essential (primary) hypertension: Secondary | ICD-10-CM

## 2020-01-17 ENCOUNTER — Other Ambulatory Visit: Payer: Self-pay | Admitting: Family Medicine

## 2020-01-29 ENCOUNTER — Other Ambulatory Visit: Payer: Self-pay | Admitting: *Deleted

## 2020-01-29 ENCOUNTER — Telehealth: Payer: Self-pay | Admitting: Family Medicine

## 2020-01-29 ENCOUNTER — Telehealth: Payer: Self-pay | Admitting: *Deleted

## 2020-01-29 ENCOUNTER — Other Ambulatory Visit: Payer: Self-pay | Admitting: Family Medicine

## 2020-01-29 DIAGNOSIS — I10 Essential (primary) hypertension: Secondary | ICD-10-CM

## 2020-01-29 DIAGNOSIS — R1310 Dysphagia, unspecified: Secondary | ICD-10-CM

## 2020-01-29 MED ORDER — ATORVASTATIN CALCIUM 40 MG PO TABS: 60.0000 mg | ORAL_TABLET | Freq: Every day | ORAL | 5 refills | Status: DC

## 2020-01-29 MED ORDER — AMLODIPINE BESYLATE 10 MG PO TABS: 10.0000 mg | ORAL_TABLET | Freq: Every day | ORAL | 5 refills | Status: DC

## 2020-01-29 NOTE — Telephone Encounter (Signed)
Patient stated that she is having issues with her esophagus and would like it stretched.  She stated that you guys talked about this in January.

## 2020-01-29 NOTE — Telephone Encounter (Signed)
Referral placed.

## 2020-01-29 NOTE — Telephone Encounter (Signed)
Patient notified that rxs have been sent in and the issue that she may have with the atorvastatin.

## 2020-01-29 NOTE — Telephone Encounter (Signed)
Spoke with patient and she was upset because she we have not called her back.  She had stated that she had called yesterday.  Advised that I did not see a call from yesterday and this was the only call that I received.  Other call was about her esophagus.  I send separate phone message to Dr. Charlett Blake about that issue.    She stated that she was unable to [facility] because they must hear from [place] first.

## 2020-01-29 NOTE — Telephone Encounter (Signed)
  CALLER : Copper Witkin  Call Back # [phone]  Concern Medication Instructions  Per patient [facility]) would not allow her to pick up medication with out instrucation from Dr Charlett Blake.    Patient tried to pick up :  atorvastatin (LIPITOR) 20 MG tablet [id number]   amLODipine (NORVASC) 10 MG tablet [id number]

## 2020-01-29 NOTE — Telephone Encounter (Signed)
Pharmacy stated that they needed new rx for the amlodipine and for the atorvastatin that ins will not cover the 20mg  at 3tabs daily and for me to send in the 40mg  (previous rx) at 1/2 tab.  Both rxs sent in.

## 2020-01-30 NOTE — Telephone Encounter (Signed)
Left detailed message on machine that referral has been placed to GI.

## 2020-02-04 ENCOUNTER — Telehealth: Payer: Self-pay

## 2020-02-04 NOTE — Telephone Encounter (Signed)
After Hours Call:   Caller states she wants to speak with the nurse to discuss if she will be continuing the Atorvastatin since the office was trying to figure out her dosage previously. Caller is requesting a call back when the office reopens this morning 02/01/2020 to further discuss what the doctor is wanting to do.

## 2020-02-05 NOTE — Telephone Encounter (Signed)
Number busy x3 times

## 2020-02-06 NOTE — Telephone Encounter (Signed)
Number busy again!

## 2020-02-07 NOTE — Telephone Encounter (Signed)
Ugh. Have her take Atorvastatin 80 mg po daily 5 days a week and skip Tuesday and Saturday

## 2020-02-07 NOTE — Telephone Encounter (Signed)
Insurance will not cover atorvastatin 40mg  at 1.5 tab daily or 20mg  at 3 tabs daily.   They will pay for a higher dose at 1 tablet at day.  Basically they will only pay for 1 tablet a day.

## 2020-02-08 MED ORDER — ATORVASTATIN CALCIUM 80 MG PO TABS: ORAL_TABLET | ORAL | 1 refills | Status: DC

## 2020-02-08 NOTE — Telephone Encounter (Signed)
Patient notified.  She stated that she would like to try and take medication Monday thru Friday instead.  I advised her that if she any problems with taking it this way then let to take it as directed but not on Tuesday and Saturday.

## 2020-02-22 ENCOUNTER — Other Ambulatory Visit: Payer: Self-pay

## 2020-02-22 ENCOUNTER — Ambulatory Visit (HOSPITAL_BASED_OUTPATIENT_CLINIC_OR_DEPARTMENT_OTHER)
Admission: RE | Admit: 2020-02-22 | Discharge: 2020-02-22 | Disposition: A | Discharge status: Home / Self Care | Payer: Medicare Other | Source: Ambulatory Visit | Attending: Family Medicine | Admitting: Family Medicine

## 2020-02-22 ENCOUNTER — Ambulatory Visit (INDEPENDENT_AMBULATORY_CARE_PROVIDER_SITE_OTHER): Payer: Medicare Other | Admitting: Gastroenterology

## 2020-02-22 ENCOUNTER — Telehealth (INDEPENDENT_AMBULATORY_CARE_PROVIDER_SITE_OTHER): Payer: Medicare Other | Admitting: Family Medicine

## 2020-02-22 ENCOUNTER — Encounter: Payer: Self-pay | Admitting: Gastroenterology

## 2020-02-22 VITALS — BP 120/87

## 2020-02-22 VITALS — BP 132/68 | HR 80 | Temp 98.0°F | Ht 61.0 in | Wt 180.0 lb

## 2020-02-22 DIAGNOSIS — G8929 Other chronic pain: Secondary | ICD-10-CM | POA: Diagnosis not present

## 2020-02-22 DIAGNOSIS — E785 Hyperlipidemia, unspecified: Secondary | ICD-10-CM

## 2020-02-22 DIAGNOSIS — R12 Heartburn: Secondary | ICD-10-CM

## 2020-02-22 DIAGNOSIS — R748 Abnormal levels of other serum enzymes: Secondary | ICD-10-CM | POA: Diagnosis not present

## 2020-02-22 DIAGNOSIS — R142 Eructation: Secondary | ICD-10-CM | POA: Diagnosis not present

## 2020-02-22 DIAGNOSIS — M545 Low back pain, unspecified: Secondary | ICD-10-CM

## 2020-02-22 DIAGNOSIS — M47899 Other spondylosis, site unspecified: Secondary | ICD-10-CM | POA: Diagnosis not present

## 2020-02-22 DIAGNOSIS — E1169 Type 2 diabetes mellitus with other specified complication: Secondary | ICD-10-CM

## 2020-02-22 DIAGNOSIS — E669 Obesity, unspecified: Secondary | ICD-10-CM | POA: Diagnosis not present

## 2020-02-22 DIAGNOSIS — I10 Essential (primary) hypertension: Secondary | ICD-10-CM | POA: Diagnosis not present

## 2020-02-22 DIAGNOSIS — R1310 Dysphagia, unspecified: Secondary | ICD-10-CM

## 2020-02-22 MED ORDER — ROSUVASTATIN CALCIUM 20 MG PO TABS: 20.0000 mg | ORAL_TABLET | Freq: Every day | ORAL | 3 refills | Status: DC

## 2020-02-22 MED ORDER — TIZANIDINE HCL 2 MG PO TABS: 1.0000 mg | ORAL_TABLET | Freq: Three times a day (TID) | ORAL | 1 refills | Status: DC | PRN

## 2020-02-22 NOTE — Patient Instructions (Signed)
You have been scheduled for an endoscopy. Please follow written instructions given to you at your visit today. If you use inhalers (even only as needed), please bring them with you on the day of your procedure. Your physician has requested that you go to www.startemmi.com and enter the access code given to you at your visit today. This web site gives a general overview about your procedure. However, you should still follow specific instructions given to you by our office regarding your preparation for the procedure.  It was a pleasure to see you today!  Vito Cirigliano, D.O.  

## 2020-02-22 NOTE — Progress Notes (Signed)
Chief Complaint: Dysphagia   Referring Provider:     Mosie Lukes, MD    HPI:     Heather Roberts is a 74 y.o. female with a history of diabetes, hypertension, hyperlipidemia, obesity (BMI 34), anxiety, diverticulosis, referred to [facility] for evaluation of dysphagia.  Dysphagia started Feb this year. First occurred when rushing to eat. Has occurred intermittently since then despite taking small bites and chewing thoroughly. Points to suprasternal notch.  Only occurs with solids.  No prior similar sxs.  Weight stable, no fevers, chills, night sweats.  No nausea or vomiting.  No lower GI symptoms.  Rare episodes of reflux, increased belching lately. Improves with ginger ale. No antacids/acid suppression medications.   Thinks she had an EGD in 1970's.   -12/28/2019: Normal CBC, CMP (ALP 124; previously normal) -No recent abdominal imaging for review  Endoscopic history: -Colonoscopy (10/2005, Dr. Wynetta Emery at [place]): Normal per patient - ColoGuard negative (01/2016)   Past Medical History:  Diagnosis Date  . Allergic rhinitis   . Anemia 03/19/2013  . Anxiety   . Bouchard nodes (DJD hand)   . Carotid artery disease ([place]) 03/24/2014  . Cervical cancer screening 07/31/2015  . Dense breast tissue 03/03/2017  . Diabetes mellitus type II   . DJD (degenerative joint disease), lumbar   . Fibroids   . Glaucoma   . H/O diverticulitis of colon 03/03/2017  . History of DVT (deep vein thrombosis) 1994  . Hyperlipidemia   . Hypertension   . Insomnia 02/03/2016  . Nocturia 12/18/2012  . Obesity 07/31/2015  . Palpitations 12/18/2012     Past Surgical History:  Procedure Laterality Date  . ABDOMINAL HYSTERECTOMY    . CHOLECYSTECTOMY    . COLONOSCOPY     approx 8 years ago  . EYE SURGERY  09/2014, 08/2014   Cataracts removal  . KNEE ARTHROSCOPY Bilateral    Left 2007, Right 1999   Family History  Problem Relation Age of Onset  . Stroke  Sister   . Diabetes Mother   . Kidney disease Mother   . COPD Father        smoker  . Stroke Sister   . Breast cancer Sister        s/p mastectomy  . COPD Brother   . Stroke Brother   . Stroke Other   . Diabetes Paternal Grandmother   . Colon cancer Neg Hx   . Liver cancer Neg Hx    Social History   Tobacco Use  . Smoking status: Former Smoker    Packs/day: 0.30    Years: 8.00    Pack years: 2.40    Types: Cigarettes  . Smokeless tobacco: Never Used  Substance Use Topics  . Alcohol use: Yes    Alcohol/week: 0.0 standard drinks    Comment: Rarely---glass of wine with a meeting  . Drug use: No   Current Outpatient Medications  Medication Sig Dispense Refill  . amLODipine (NORVASC) 10 MG tablet Take 1 tablet (10 mg total) by mouth daily. 30 tablet 5  . aspirin 81 MG tablet Take 81 mg by mouth daily.      . Cyanocobalamin (B-12) 1000 MCG CAPS Take 1 tablet by mouth as needed.     . fluticasone (FLONASE) 50 MCG/ACT nasal spray Place 2 sprays into both nostrils daily as needed for allergies or rhinitis. 16 g 6  . glucose blood (  GLUCOSE METER TEST) test strip To use with onetouch verio flex DX E11.9 100 each 6  . glucose blood (ONETOUCH VERIO) test strip Check blood sugar once daily.  DX E11.9 100 each 6  . hydrochlorothiazide (HYDRODIURIL) 12.5 MG tablet Take 1 tablet (12.5 mg total) by mouth daily. 30 tablet 5  . losartan (COZAAR) 100 MG tablet Take 1 tablet (100 mg total) by mouth daily. 90 tablet 1  . metFORMIN (GLUCOPHAGE-XR) 500 MG 24 hr tablet Take 1 tablet (500 mg total) by mouth in the morning, at noon, and at bedtime. (Patient taking differently: Take 500 mg by mouth in the morning and at bedtime. ) 90 tablet 5  . mometasone (NASONEX) 50 MCG/ACT nasal spray Place 2 sprays into the nose in the morning.     . nitroGLYCERIN (NITROSTAT) 0.4 MG SL tablet Place 1 tablet (0.4 mg total) under the tongue every 5 (five) minutes as needed for chest pain. 25 tablet 1  .  rosuvastatin (CRESTOR) 20 MG tablet Take 1 tablet (20 mg total) by mouth at bedtime. 30 tablet 3  . tiZANidine (ZANAFLEX) 2 MG tablet Take 0.5-2 tablets (1-4 mg total) by mouth every 8 (eight) hours as needed for muscle spasms. 40 tablet 1   No current facility-administered medications for this visit.   Allergies  Allergen Reactions  . Tussionex Pennkinetic Er [Hydrocod Polst-Cpm Polst Er] Other (See Comments)    Upset stomach and loose stool      Review of Systems: All systems reviewed and negative except where noted in HPI.     Physical Exam:    Wt Readings from Last 3 Encounters:  02/22/20 180 lb (81.6 kg)  11/15/19 178 lb (80.7 kg)  08/29/19 180 lb (81.6 kg)    BP 132/68   Pulse 80   Temp 98 F (36.7 C)   Ht _0  (1.549 m)   Wt 180 lb (81.6 kg)   BMI 34.01 kg/m  Constitutional:  Pleasant, in no acute distress. Psychiatric: Normal mood and affect. Behavior is normal. Neurological: Alert and oriented to person place and time. Skin: Skin is warm and dry. No rashes noted.   ASSESSMENT AND PLAN;   1) Dysphagia: New onset solid food dysphagia without history of food impaction. -EGD with esophageal dilation  2) Increased Belching 3) heartburn Rare episodes of heartburn, but with increasing belching lately.  Evaluate for erosive esophagitis, LES laxity, hiatal hernia at time of EGD as above.  Does not want to try acid suppression therapy or antacids at this time.  4) Elevated ALP -Very mildly elevated ALP (124) on recent labs.  Previously normal.  Liver enzymes and T bili otherwise normal -Repeat labs in 3-6 months.  If still elevated, could consider GGT and alk phos fractionization along with RUQ [place]  5) CRC screening -Cologuard was negative in 2017.  Prior to that normal colonoscopy 2007. -Per current guidelines, due for repeat CRC screening.  Can address at follow-up after treatment of acute issues as above  The indications, risks, and benefits of EGD were  explained to the patient in detail. Risks include but are not limited to bleeding, perforation, adverse reaction to medications, and cardiopulmonary compromise. Sequelae include but are not limited to the possibility of surgery, hositalization, and mortality. The patient verbalized understanding and wished to proceed. All questions answered, referred to scheduler. Further recommendations pending results of the exam.     Lavena Bullion, DO, FACG  02/22/2020, 3:22 PM   Mosie Lukes, MD

## 2020-02-25 ENCOUNTER — Telehealth: Payer: Self-pay | Admitting: *Deleted

## 2020-02-25 NOTE — Assessment & Plan Note (Signed)
hgba1c acceptable, minimize simple carbs. Increase exercise as tolerated. Continue current meds 

## 2020-02-25 NOTE — Telephone Encounter (Signed)
-----   Message from Mosie Lukes, MD sent at 02/24/2020  6:12 PM EDT ----- Odette Horns shows marked or advanced arthritic changes in low back. Can proceed with MRI if patient would like to.

## 2020-02-25 NOTE — Progress Notes (Signed)
Virtual Visit via phone Note  I connected with Heather Roberts on 02/22/20 at  8:20 AM EDT by a phone enabled telemedicine application and verified that I am speaking with the correct person using two identifiers.  Location: Patient: home Provider: home   I discussed the limitations of evaluation and management by telemedicine and the availability of in person appointments. The patient expressed understanding and agreed to proceed. Kem Boroughs, CMA was able to set the patient up on visit, phone after being unable to set up a video visit.    Subjective:    Patient ID: Heather Roberts, female    DOB: November 14, 1945, 74 y.o.   MRN: [id number]  Chief Complaint  Patient presents with  . 4 month follow up    HPI Patient is in today for follow up on chronic medical concerns. She isnoting a flare in pain since her Atorvastatin was increased. She has headaches, increased bilateral shoulder and neck pain, pain when she raises her arms to play with her grandson and some weakness. Also notes some pain in left leg, lower. No recent fall or trauma. Denies CP/palp/SOB/HA/congestion/fevers/GI or GU c/o. Taking meds as prescribed  Past Medical History:  Diagnosis Date  . Allergic rhinitis   . Anemia 03/19/2013  . Anxiety   . Bouchard nodes (DJD hand)   . Carotid artery disease ([place]) 03/24/2014  . Cervical cancer screening 07/31/2015  . Dense breast tissue 03/03/2017  . Diabetes mellitus type II   . DJD (degenerative joint disease), lumbar   . Fibroids   . Glaucoma   . H/O diverticulitis of colon 03/03/2017  . History of DVT (deep vein thrombosis) 1994  . Hyperlipidemia   . Hypertension   . Insomnia 02/03/2016  . Nocturia 12/18/2012  . Obesity 07/31/2015  . Palpitations 12/18/2012    Past Surgical History:  Procedure Laterality Date  . ABDOMINAL HYSTERECTOMY    . CHOLECYSTECTOMY    . COLONOSCOPY     approx 8 years ago  . EYE SURGERY  09/2014, 08/2014   Cataracts removal    . KNEE ARTHROSCOPY Bilateral    Left 2007, Right 1999    Family History  Problem Relation Age of Onset  . Stroke Sister   . Diabetes Mother   . Kidney disease Mother   . COPD Father        smoker  . Stroke Sister   . Breast cancer Sister        s/p mastectomy  . COPD Brother   . Stroke Brother   . Stroke Other   . Diabetes Paternal Grandmother   . Colon cancer Neg Hx   . Liver cancer Neg Hx     Social History   Socioeconomic History  . Marital status: Married    Spouse name: Essick  . Number of children: 3  . Years of education: Not on file  . Highest education level: Not on file  Occupational History  . Occupation: Scientist, research (physical sciences): RETIRED    Comment: used to work for [employer]  . Smoking status: Former Smoker    Packs/day: 0.30    Years: 8.00    Pack years: 2.40    Types: Cigarettes  . Smokeless tobacco: Never Used  Substance and Sexual Activity  . Alcohol use: Yes    Alcohol/week: 0.0 standard drinks    Comment: Rarely---glass of wine with a meeting  . Drug use: No  . Sexual activity: Yes  Partners: Male    Comment: lives with husband, no dietary restrictions, minimizes dairy is walking more now  Other Topics Concern  . 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 Difficulty of Paying Living Expenses:   Food Insecurity:   . Worried About Charity fundraiser in the Last Year:   . Arboriculturist in the Last Year:   Transportation Needs:   . Film/video editor (Medical):   Marland Kitchen Lack of Transportation (Non-Medical):   Physical Activity:   . Days of Exercise per Week:   . Minutes of Exercise per Session:   Stress:   . Feeling of Stress :   Social Connections:   . Frequency of Communication with Friends and  Family:   . Frequency of Social Gatherings with Friends and Family:   . Attends Religious Services:   . Active Member of Clubs or Organizations:   . Attends Archivist Meetings:   Marland Kitchen Marital Status:   Intimate Partner Violence:   . Fear of Current or Ex-Partner:   . Emotionally Abused:   Marland Kitchen Physically Abused:   . Sexually Abused:     Outpatient Medications Prior to Visit  Medication Sig Dispense Refill  . amLODipine (NORVASC) 10 MG tablet Take 1 tablet (10 mg total) by mouth daily. 30 tablet 5  . aspirin 81 MG tablet Take 81 mg by mouth daily.      . Cyanocobalamin (B-12) 1000 MCG CAPS Take 1 tablet by mouth as needed.     . fluticasone (FLONASE) 50 MCG/ACT nasal spray Place 2 sprays into both nostrils daily as needed for allergies or rhinitis. 16 g 6  . glucose blood (GLUCOSE METER TEST) test strip To use with onetouch verio flex DX E11.9 100 each 6  . glucose blood (ONETOUCH VERIO) test strip Check blood sugar once daily.  DX E11.9 100 each 6  . hydrochlorothiazide (HYDRODIURIL) 12.5 MG tablet Take 1 tablet (12.5 mg total) by mouth daily. 30 tablet 5  . losartan (COZAAR) 100 MG tablet Take 1 tablet (100 mg total) by mouth daily. 90 tablet 1  . metFORMIN (GLUCOPHAGE-XR) 500 MG 24 hr tablet Take 1 tablet (500 mg total) by mouth in the morning, at noon, and at bedtime. (Patient taking differently: Take 500 mg by mouth in the morning and at bedtime. ) 90 tablet 5  . mometasone (NASONEX) 50 MCG/ACT nasal spray Place 2 sprays into the nose in the morning.     . nitroGLYCERIN (NITROSTAT) 0.4 MG SL tablet Place 1 tablet (0.4 mg total) under the tongue every 5 (five) minutes as needed for chest pain. 25 tablet 1  . atorvastatin (LIPITOR) 80 MG tablet Take 1 tablet daily 5 times a week 60 tablet 1  . cyclobenzaprine (FLEXERIL) 5 MG tablet Take 1 tablet (5 mg total) by mouth at bedtime. 5 tablet 0  . meloxicam (MOBIC) 7.5 MG tablet Take 1 tablet (7.5 mg total) by mouth daily. 10 tablet 0    . metoprolol succinate (TOPROL-XL) 25 MG 24 hr tablet Take 25 mg by mouth as directed. 1/2 tablet daily with dinner     No facility-administered medications prior to  visit.    Allergies  Allergen Reactions  . Tussionex Pennkinetic Er [Hydrocod Polst-Cpm Polst Er] Other (See Comments)    Upset stomach and loose stool     Review of Systems  Constitutional: Positive for malaise/fatigue. Negative for fever.  HENT: Negative for congestion.   Eyes: Negative for blurred vision.  Respiratory: Negative for shortness of breath.   Cardiovascular: Negative for chest pain, palpitations and leg swelling.  Gastrointestinal: Negative for abdominal pain, blood in stool and nausea.  Genitourinary: Negative for dysuria and frequency.  Musculoskeletal: Positive for back pain, joint pain, myalgias and neck pain. Negative for falls.  Skin: Negative for rash.  Neurological: Positive for headaches. Negative for dizziness and loss of consciousness.  Endo/Heme/Allergies: Negative for environmental allergies.  Psychiatric/Behavioral: Negative for depression. The patient is not nervous/anxious.        Objective:    Physical Exam  BP 120/87  Wt Readings from Last 3 Encounters:  02/22/20 180 lb (81.6 kg)  11/15/19 178 lb (80.7 kg)  08/29/19 180 lb (81.6 kg)    Diabetic Foot Exam - Simple   No data filed     Lab Results  Component Value Date   WBC 6.0 12/28/2019   HGB 12.6 12/28/2019   HCT 37.4 12/28/2019   PLT 238.0 12/28/2019   GLUCOSE 143 (H) 12/28/2019   CHOL 148 12/28/2019   TRIG 86.0 12/28/2019   HDL 60.10 12/28/2019   LDLDIRECT 118.5 10/04/2007   LDLCALC 71 12/28/2019   ALT 15 12/28/2019   AST 21 12/28/2019   NA 137 12/28/2019   K 4.5 12/28/2019   CL 102 12/28/2019   CREATININE 0.96 12/28/2019   BUN 12 12/28/2019   CO2 27 12/28/2019   TSH 1.30 12/28/2019   HGBA1C 8.1 (H) 12/28/2019   MICROALBUR 0.50 06/14/2012    Lab Results  Component Value Date   TSH 1.30 12/28/2019    Lab Results  Component Value Date   WBC 6.0 12/28/2019   HGB 12.6 12/28/2019   HCT 37.4 12/28/2019   MCV 94.0 12/28/2019   PLT 238.0 12/28/2019   Lab Results  Component Value Date   NA 137 12/28/2019   K 4.5 12/28/2019   CO2 27 12/28/2019   GLUCOSE 143 (H) 12/28/2019   BUN 12 12/28/2019   CREATININE 0.96 12/28/2019   BILITOT 1.0 12/28/2019   ALKPHOS 124 (H) 12/28/2019   AST 21 12/28/2019   ALT 15 12/28/2019   PROT 7.5 12/28/2019   ALBUMIN 4.2 12/28/2019   CALCIUM 9.8 12/28/2019   GFR 68.83 12/28/2019   Lab Results  Component Value Date   CHOL 148 12/28/2019   Lab Results  Component Value Date   HDL 60.10 12/28/2019   Lab Results  Component Value Date   LDLCALC 71 12/28/2019   Lab Results  Component Value Date   TRIG 86.0 12/28/2019   Lab Results  Component Value Date   CHOLHDL 2 12/28/2019   Lab Results  Component Value Date   HGBA1C 8.1 (H) 12/28/2019       Assessment & Plan:   Problem List Items Addressed This Visit    Diabetes mellitus type 2 in obese ([place])    hgba1c acceptable, minimize simple carbs. Increase exercise as tolerated. Continue current meds      Relevant Medications   rosuvastatin (CRESTOR) 20 MG tablet   Hyperlipidemia associated with type 2 diabetes mellitus ([place])    She has had increased HA< shoulder pain, neck pain, arm pain and weakness since  her Atorvastatin was increased. She is advised to take break for a week and then to start Rosuvastatin 20 mg po qhs to see if she tolerates that.       Relevant Medications   rosuvastatin (CRESTOR) 20 MG tablet   Essential hypertension    Monitor and report any concerning numbers, no changes to meds. Encouraged heart healthy diet such as the DASH diet and exercise as tolerated.       Relevant Medications   rosuvastatin (CRESTOR) 20 MG tablet   Osteoarthritis of spine - Primary   Relevant Medications   tiZANidine (ZANAFLEX) 2 MG tablet   Other Relevant Orders   DG Lumbar Spine  2-3 Views (Completed)   Low back pain    Encouraged moist heat and gentle stretching as tolerated. May try NSAIDs and prescription meds as directed and report if symptoms worsen or seek immediate care. Xray confirms progression of degenerative changes but she declines MRI for now. Is allowed a muscle relaxer to use prn.       Relevant Medications   tiZANidine (ZANAFLEX) 2 MG tablet      I have discontinued Council Mechanic. Watkins Williams's atorvastatin. I am also having her start on rosuvastatin and tiZANidine. Additionally, I am having her maintain her aspirin, fluticasone, B-12, glucose blood, glucose blood, mometasone, hydrochlorothiazide, losartan, nitroGLYCERIN, metFORMIN, and amLODipine.  Meds ordered this encounter  Medications  . rosuvastatin (CRESTOR) 20 MG tablet    Sig: Take 1 tablet (20 mg total) by mouth at bedtime.    Dispense:  30 tablet    Refill:  3  . tiZANidine (ZANAFLEX) 2 MG tablet    Sig: Take 0.5-2 tablets (1-4 mg total) by mouth every 8 (eight) hours as needed for muscle spasms.    Dispense:  40 tablet    Refill:  1     I discussed the assessment and treatment plan with the patient. The patient was provided an opportunity to ask questions and all were answered. The patient agreed with the plan and demonstrated an understanding of the instructions.   The patient was advised to call back or seek an in-person evaluation if the symptoms worsen or if the condition fails to improve as anticipated.  I provided 20 minutes of non-face-to-face time during this encounter.   [name], MD

## 2020-02-25 NOTE — Telephone Encounter (Signed)
Patient states that she does not want to the MRI.  She is claustrophobic.  She also stated that she needed something for pain because the tizanidine was not working for her.  I asked how much was she taking and she stated that she was taking only 1 tab.  Advised her that per her instructions that she can take up to 2 tabs.  She stated that she will try 2 tabs and call us back.  She also wanted you to know that she saw Dr. Bryan Lemma and is scheduled for an endoscopy on 03/19/20.

## 2020-02-25 NOTE — Assessment & Plan Note (Signed)
Monitor and report any concerning numbers, no changes to meds. Encouraged heart healthy diet such as the DASH diet and exercise as tolerated.  

## 2020-02-25 NOTE — Assessment & Plan Note (Signed)
Encouraged moist heat and gentle stretching as tolerated. May try NSAIDs and prescription meds as directed and report if symptoms worsen or seek immediate care. Xray confirms progression of degenerative changes but she declines MRI for now. Is allowed a muscle relaxer to use prn.

## 2020-02-25 NOTE — Assessment & Plan Note (Signed)
She has had increased HA< shoulder pain, neck pain, arm pain and weakness since her Atorvastatin was increased. She is advised to take break for a week and then to start Rosuvastatin 20 mg po qhs to see if she tolerates that.

## 2020-02-25 NOTE — Telephone Encounter (Signed)
If she is willing I can give her something for the anxiety of getting the MRI.

## 2020-02-26 NOTE — Telephone Encounter (Signed)
Advised patient about this before I sent this and she stated that she still does not want to do this.  She stated that it also takes too long to do exam like 45 min.  So she still declines.

## 2020-03-03 ENCOUNTER — Telehealth: Payer: Self-pay

## 2020-03-03 NOTE — Telephone Encounter (Signed)
Patient called to see what time she should take her cholesterol medication.  Advised to take at bedtime.  She also had a question about should she even start because she is suppose to stop all meds and only take asa 7 days before endoscopy.  Advised patient it is the other way around to stop asa and take other meds and it is ok to start cholesterol med tonight.

## 2020-03-03 NOTE — Telephone Encounter (Signed)
Patient called in to see if she could speak with the nurse about her medication. Please give the patient a call at [phone] to advise.

## 2020-03-19 ENCOUNTER — Encounter: Payer: Self-pay | Admitting: Gastroenterology

## 2020-03-19 ENCOUNTER — Other Ambulatory Visit: Payer: Self-pay

## 2020-03-19 ENCOUNTER — Ambulatory Visit (AMBULATORY_SURGERY_CENTER): Payer: Medicare Other | Admitting: Gastroenterology

## 2020-03-19 VITALS — BP 123/59 | HR 68 | Temp 96.6°F | Resp 23 | Ht 61.0 in | Wt 180.0 lb

## 2020-03-19 DIAGNOSIS — K2970 Gastritis, unspecified, without bleeding: Secondary | ICD-10-CM | POA: Diagnosis not present

## 2020-03-19 DIAGNOSIS — K449 Diaphragmatic hernia without obstruction or gangrene: Secondary | ICD-10-CM | POA: Diagnosis not present

## 2020-03-19 DIAGNOSIS — K2990 Gastroduodenitis, unspecified, without bleeding: Secondary | ICD-10-CM

## 2020-03-19 DIAGNOSIS — R1310 Dysphagia, unspecified: Secondary | ICD-10-CM

## 2020-03-19 DIAGNOSIS — K2100 Gastro-esophageal reflux disease with esophagitis, without bleeding: Secondary | ICD-10-CM

## 2020-03-19 DIAGNOSIS — K2950 Unspecified chronic gastritis without bleeding: Secondary | ICD-10-CM | POA: Diagnosis not present

## 2020-03-19 DIAGNOSIS — R1319 Other dysphagia: Secondary | ICD-10-CM

## 2020-03-19 MED ORDER — SODIUM CHLORIDE 0.9 % IV SOLN: 500.0000 mL | Freq: Once | INTRAVENOUS | Status: DC

## 2020-03-19 MED ORDER — OMEPRAZOLE 20 MG PO CPDR
20.0000 mg | DELAYED_RELEASE_CAPSULE | Freq: Two times a day (BID) | ORAL | 1 refills | Status: DC
Start: 2020-03-19 — End: 2021-04-21

## 2020-03-19 NOTE — Patient Instructions (Signed)
Handouts given for hiatal hernia, esophagitis and soft diet.  Stay on soft diet today.  Pick up prescription for Omeprazole at your pharmacy.  Await pathology.  YOU HAD AN ENDOSCOPIC PROCEDURE TODAY AT [facility]:   Refer to the procedure report that was given to you for any specific questions about what was found during the examination.  If the procedure report does not answer your questions, please call your gastroenterologist to clarify.  If you requested that your care partner not be given the details of your procedure findings, then the procedure report has been included in a sealed envelope for you to review at your convenience later.  YOU SHOULD EXPECT: Some feelings of bloating in the abdomen. Passage of more gas than usual.  Walking can help get rid of the air that was put into your GI tract during the procedure and reduce the bloating. If you had a lower endoscopy (such as a colonoscopy or flexible sigmoidoscopy) you may notice spotting of blood in your stool or on the toilet paper. If you underwent a bowel prep for your procedure, you may not have a normal bowel movement for a few days.  Please Note:  You might notice some irritation and congestion in your nose or some drainage.  This is from the oxygen used during your procedure.  There is no need for concern and it should clear up in a day or so.  SYMPTOMS TO REPORT IMMEDIATELY:   Following upper endoscopy (EGD)  Vomiting of blood or coffee ground material  New chest pain or pain under the shoulder blades  Painful or persistently difficult swallowing  New shortness of breath  Fever of 100F or higher  Black, tarry-looking stools  For urgent or emergent issues, a gastroenterologist can be reached at any hour by calling [phone]. Do not use MyChart messaging for urgent concerns.    DIET:  We do recommend a small meal at first, but then you may proceed to your regular diet.  Drink plenty of fluids but  you should avoid alcoholic beverages for 24 hours.  ACTIVITY:  You should plan to take it easy for the rest of today and you should NOT DRIVE or use heavy machinery until tomorrow (because of the sedation medicines used during the test).    FOLLOW UP: Our staff will call the number listed on your records 48-72 hours following your procedure to check on you and address any questions or concerns that you may have regarding the information given to you following your procedure. If we do not reach you, we will leave a message.  We will attempt to reach you two times.  During this call, we will ask if you have developed any symptoms of COVID 19. If you develop any symptoms (ie: fever, flu-like symptoms, shortness of breath, cough etc.) before then, please call [phone].  If you test positive for Covid 19 in the 2 weeks post procedure, please call and report this information to [place].    If any biopsies were taken you will be contacted by phone or by letter within the next 1-3 weeks.  Please call us at [phone] if you have not heard about the biopsies in 3 weeks.    SIGNATURES/CONFIDENTIALITY: You and/or your care partner have signed paperwork which will be entered into your electronic medical record.  These signatures attest to the fact that that the information above on your After Visit Summary has been reviewed and is understood.  Full  responsibility of the confidentiality of this discharge information lies with you and/or your care-partner.

## 2020-03-19 NOTE — Progress Notes (Signed)
pt tolerated well. VSS. awake and to recovery. Report given to RN. Bite placed when awake, no complaints and removed with ease. Atraumatic.

## 2020-03-19 NOTE — Op Note (Signed)
[place] Patient Name: Heather Roberts Procedure Date: 03/19/2020 9:36 AM MRN: [id number] Endoscopist: Gerrit Heck , MD Age: 74 Referring MD:  Date of Birth: November 18, 1945 Gender: Female Account #: [id number] Procedure:                Upper GI endoscopy Indications:              Dysphagia, Suspected esophageal reflux Medicines:                Monitored Anesthesia Care Procedure:                Pre-Anesthesia Assessment:                           - Prior to the procedure, a History and Physical                            was performed, and patient medications and                            allergies were reviewed. The patient's tolerance of                            previous anesthesia was also reviewed. The risks                            and benefits of the procedure and the sedation                            options and risks were discussed with the patient.                            All questions were answered, and informed consent                            was obtained. Prior Anticoagulants: The patient has                            taken no previous anticoagulant or antiplatelet                            agents. ASA Grade Assessment: III - A patient with                            severe systemic disease. After reviewing the risks                            and benefits, the patient was deemed in                            satisfactory condition to undergo the procedure.                           After obtaining informed consent, the endoscope was  passed under direct vision. Throughout the                            procedure, the patient's blood pressure, pulse, and                            oxygen saturations were monitored continuously. The                            Endoscope was introduced through the mouth, and                            advanced to the second part of duodenum. The upper                            GI  endoscopy was accomplished without difficulty.                            The patient tolerated the procedure well. Scope In: Scope Out: Findings:                 LA Grade A (one or more mucosal breaks less than 5                            mm, not extending between tops of 2 mucosal folds)                            esophagitis with no bleeding was found 36 cm from                            the incisors.                           A 2 cm hiatal hernia was present.                           Esophagogastric landmarks were identified: the                            Z-line was found at 36 cm and the site of hiatal                            narrowing was found at 38 cm from the incisors.                           The upper third of the esophagus, middle third of                            the esophagus and lower third of the esophagus were                            otherwise normal. A guidewire was placed and the  scope was withdrawn. Empiric dilation was performed                            with a Savary dilator with mild resistance at 23                            Fr. The dilation site was examined following                            endoscope reinsertion and showed no bleeding,                            mucosal tear or perforation. Estimated blood loss:                            none.                           Diffuse mild inflammation characterized by                            congestion (edema) and erythema was found in the                            gastric fundus, in the gastric body and in the                            gastric antrum. Biopsies were taken with a cold                            forceps for Helicobacter pylori testing. Estimated                            blood loss was minimal.                           The duodenal bulb, first portion of the duodenum                            and second portion of the duodenum were  normal. Complications:            No immediate complications. Estimated Blood Loss:     Estimated blood loss was minimal. Impression:               - LA Grade A reflux esophagitis with no bleeding.                           - 2 cm hiatal hernia.                           - Esophagogastric landmarks identified.                           - Normal upper third of esophagus, middle third of  esophagus and lower third of esophagus. Dilated                            with 57 Fr Savary dilator.                           - Gastritis. Biopsied.                           - Normal duodenal bulb, first portion of the                            duodenum and second portion of the duodenum. Recommendation:           - Patient has a contact number available for                            emergencies. The signs and symptoms of potential                            delayed complications were discussed with the                            patient. Return to normal activities tomorrow.                            Written discharge instructions were provided to the                            patient.                           - Soft diet today.                           - Continue present medications.                           - Await pathology results.                           - Start Prilosec (omeprazole) 20 mg PO BID for 6                            weeks to promote mucosal healing and for                            diagnostic/therapeutic intent of increasing reflux                            symptoms. If symptoms well controlled, will then                            plan to titrate to lowest effective dose.                           -  Return to GI clinic in 2 months, or sooner as                            needed. Gerrit Heck, MD 03/19/2020 10:15:45 AM

## 2020-03-19 NOTE — Progress Notes (Signed)
Pt's states no medical or surgical changes since previsit or office visit. 

## 2020-03-21 ENCOUNTER — Encounter: Payer: Self-pay | Admitting: Gastroenterology

## 2020-03-21 ENCOUNTER — Telehealth: Payer: Self-pay | Admitting: *Deleted

## 2020-03-21 NOTE — Telephone Encounter (Signed)
Pt called returning your call  stating she was "Fine AS any Wine",

## 2020-03-21 NOTE — Telephone Encounter (Signed)
First attempt, left VM.  

## 2020-04-17 ENCOUNTER — Other Ambulatory Visit: Payer: Self-pay | Admitting: Family Medicine

## 2020-04-17 DIAGNOSIS — E1169 Type 2 diabetes mellitus with other specified complication: Secondary | ICD-10-CM

## 2020-05-06 ENCOUNTER — Encounter: Payer: Self-pay | Admitting: Gastroenterology

## 2020-05-06 ENCOUNTER — Ambulatory Visit (INDEPENDENT_AMBULATORY_CARE_PROVIDER_SITE_OTHER): Payer: Medicare Other | Admitting: Gastroenterology

## 2020-05-06 VITALS — BP 134/70 | HR 71 | Ht 61.0 in | Wt 177.0 lb

## 2020-05-06 DIAGNOSIS — Z1212 Encounter for screening for malignant neoplasm of rectum: Secondary | ICD-10-CM | POA: Diagnosis not present

## 2020-05-06 DIAGNOSIS — K2100 Gastro-esophageal reflux disease with esophagitis, without bleeding: Secondary | ICD-10-CM | POA: Diagnosis not present

## 2020-05-06 DIAGNOSIS — K5900 Constipation, unspecified: Secondary | ICD-10-CM | POA: Diagnosis not present

## 2020-05-06 DIAGNOSIS — R748 Abnormal levels of other serum enzymes: Secondary | ICD-10-CM

## 2020-05-06 DIAGNOSIS — R1319 Other dysphagia: Secondary | ICD-10-CM

## 2020-05-06 DIAGNOSIS — Z1211 Encounter for screening for malignant neoplasm of colon: Secondary | ICD-10-CM

## 2020-05-06 DIAGNOSIS — R142 Eructation: Secondary | ICD-10-CM

## 2020-05-06 DIAGNOSIS — R140 Abdominal distension (gaseous): Secondary | ICD-10-CM

## 2020-05-06 DIAGNOSIS — R1310 Dysphagia, unspecified: Secondary | ICD-10-CM

## 2020-05-06 MED ORDER — HYOSCYAMINE SULFATE 0.125 MG SL SUBL
0.1250 mg | SUBLINGUAL_TABLET | SUBLINGUAL | 3 refills | Status: AC | PRN
Start: 2020-05-06 — End: ?

## 2020-05-06 NOTE — Patient Instructions (Addendum)
If you are age 74 or older, your body mass index should be between 23-30. Your Body mass index is 33.44 kg/m. If this is out of the aforementioned range listed, please consider follow up with your Primary Care Provider.  If you are age 6 or younger, your body mass index should be between 19-25. Your Body mass index is 33.44 kg/m. If this is out of the aformentioned range listed, please consider follow up with your Primary Care Provider.   You have been scheduled for a colonoscopy. Please follow written instructions given to you at your visit today.  Please pick up your prep supplies at the pharmacy within the next 1-3 days. If you use inhalers (even only as needed), please bring them with you on the day of your procedure.  Your provider has requested that you go to the basement level for lab work on the day of procedure at [facility]. [place], [place]. Press "B" on the elevator. The lab is located at the first door on the left as you exit the elevator.  We have sent the following medications to your pharmacy for you to pick up at your convenience: [place] or Gas -X as needed. This is over-the-counter.  Over-the counter Dulcolax is okay.  See attached Fiber chart.  Follow up in six months.  Please call the office for an appointment as the schedule is not available at this time.  It was a pleasure to see you today!  Gerrit Heck, D.O.  Fiber Chart  You should be consuming 25-30g of fiber per day and drinking 8 glasses of water to help your bowels move regularly.  In the chart below you can look up how much fiber you are getting in an average day.  If you are not getting enough fiber, you should add a fiber supplement to your diet.  Examples of this include Metamucil, FiberCon and Citrucel.  These can be purchased at your local grocery store or pharmacy.      [email or web address]

## 2020-05-06 NOTE — Progress Notes (Signed)
P  Chief Complaint:    Dysphagia, procedure follow-up, GERD  GI History: Heather Roberts is a 74 y.o. female with a history of diabetes, hypertension, hyperlipidemia, obesity (BMI 34), anxiety, diverticulosis,  referred to the GI clinic on 02/22/2020 with solid food dysphagia.  Symptoms started 11/2019, pointing to suprasternal notch. -EGD 03/19/2020: LA Grade A esophagitis, 2 cm HH, empiric dilation with 57 Fr Maloney  Rare episodes of reflux, and increased belching.  EGD 03/2020 with LA Grade A esophagitis.  Started on PPI with resolution of reflux symptoms.  Separately, very mildly elevated ALP (124) in 12/2019.  Previously normal.  Liver enzymes and T bili otherwise normal.  Hepatic steatosis without biliary dilation on CT in 02/2017.  No subsequent hepatobiliary imaging.  Endoscopic history: -Colonoscopy (10/2005, Dr. Wynetta Emery at [facility]): Normal per patient - ColoGuard negative (01/2016) -EGD (03/19/2020, Dr. Bryan Lemma): LA Grade A esophagitis, 2 cm HH, empiric dilation with 60 Fr Maloney, non-H. pylori gastritis   HPI:     Patient is a 74 y.o. female presenting to the Gastroenterology Clinic for follow-up.  Initially seen by me 02/22/2020 with intermittent solid food dysphagia.  EGD 03/19/2020 with LA Grade A erosive esophagitis, 2 cm HH, and 9 H. pylori gastritis.  Performed empiric dilation with 57 Fr Maloney-no mucosal rent.  She states dysphagia resolved after EGD with empiric dilation. Resolution of reflux sxs since starting omeprazole at time of EGD.   Today, main complaint is post prandial belching and bloating.   Also with constipation over the last week or so since eating at restaurant, and wanting to know if she can take a laxative as needed.   Has increased stressors lately, to include 2 funerals over the last week and grandson's wedding this weekend, and feels this is contributing to her GI symptoms.  Separately, previously mildly elevated ALP, with otherwise normal  enzymes and T bili.  Plan for repeat in 3-6 months; ordering panel today.  Also due for ongoing CRC screening.    Review of systems:     No chest pain, no SOB, no fevers, no urinary sx   Past Medical History:  Diagnosis Date  . Allergic rhinitis   . Anemia 03/19/2013  . Anxiety   . Bouchard nodes (DJD hand)   . Carotid artery disease ([place]) 03/24/2014  . Cervical cancer screening 07/31/2015  . Dense breast tissue 03/03/2017  . Diabetes mellitus type II   . DJD (degenerative joint disease), lumbar   . Fibroids   . Glaucoma   . H/O diverticulitis of colon 03/03/2017  . History of DVT (deep vein thrombosis) 1994  . Hyperlipidemia   . Hypertension   . Insomnia 02/03/2016  . Nocturia 12/18/2012  . Obesity 07/31/2015  . Palpitations 12/18/2012    Patient's surgical history, family medical history, social history, medications and allergies were all reviewed in Epic    Current Outpatient Medications  Medication Sig Dispense Refill  . amLODipine (NORVASC) 10 MG tablet Take 1 tablet (10 mg total) by mouth daily. 30 tablet 5  . aspirin 81 MG tablet Take 81 mg by mouth daily.      . Cyanocobalamin (B-12) 1000 MCG CAPS Take 1 tablet by mouth as needed.     . fluticasone (FLONASE) 50 MCG/ACT nasal spray Place 2 sprays into both nostrils daily as needed for allergies or rhinitis. 16 g 6  . glucose blood (GLUCOSE METER TEST) test strip To use with onetouch verio flex DX E11.9 100 each  6  . glucose blood (ONETOUCH VERIO) test strip Check blood sugar once daily.  DX E11.9 100 each 6  . hydrochlorothiazide (HYDRODIURIL) 12.5 MG tablet Take 1 tablet (12.5 mg total) by mouth daily. 30 tablet 5  . losartan (COZAAR) 100 MG tablet Take 1 tablet (100 mg total) by mouth daily. 90 tablet 1  . metFORMIN (GLUCOPHAGE-XR) 500 MG 24 hr tablet Take 1 tablet (500 mg total) by mouth in the morning and at bedtime. 180 tablet 1  . mometasone (NASONEX) 50 MCG/ACT nasal spray Place 2 sprays into the nose in the morning.      . nitroGLYCERIN (NITROSTAT) 0.4 MG SL tablet Place 1 tablet (0.4 mg total) under the tongue every 5 (five) minutes as needed for chest pain. 25 tablet 1  . omeprazole (PRILOSEC) 20 MG capsule Take 1 capsule (20 mg total) by mouth 2 (two) times daily before a meal. 84 capsule 1  . rosuvastatin (CRESTOR) 20 MG tablet TAKE 1 TABLET BY MOUTH EVERYDAY AT BEDTIME 90 tablet 1  . tiZANidine (ZANAFLEX) 2 MG tablet Take 0.5-2 tablets (1-4 mg total) by mouth every 8 (eight) hours as needed for muscle spasms. 40 tablet 1   No current facility-administered medications for this visit.    Physical Exam:     BP 134/70   Pulse 71   Ht _0  (1.549 m)   Wt 177 lb (80.3 kg)   BMI 33.44 kg/m   GENERAL:  Pleasant female in NAD PSYCH: : Cooperative, normal affect EENT:  conjunctiva pink, mucous membranes moist, neck supple without masses CARDIAC:  RRR, no murmur heard, no peripheral edema PULM: Normal respiratory effort, lungs CTA bilaterally, no wheezing ABDOMEN:  Nondistended, soft, nontender. No obvious masses, no hepatomegaly,  normal bowel sounds SKIN:  turgor, no lesions seen Musculoskeletal:  Normal muscle tone, normal strength NEURO: Alert and oriented x 3, no focal neurologic deficits   IMPRESSION and PLAN:    1) Dysphagia: Resolved with recent EGD with empiric dilation. -Continue cutting food in small pieces, chew thoroughly, and plenty of fluids with meals.  Otherwise, no further specific work-up  2) Increased Belching 3) Heartburn-resolved 4) GERD with erosive esophagitis -EGD with LA Grade A erosive esophagitis.  Started on omeprazole bid which she has since decreased to daily, essentially with resolution heartburn.  Still with intermittent belching which tends to be postprandial. -Resume omeprazole -Okay to use [place] or Gas-X as needed  5) Elevated ALP -Very mildly elevated ALP (124) on recent labs in 3/21.  Previously normal.  Liver enzymes and T bili otherwise  normal -Repeat labs in September.  If still elevated, could consider GGT and alk phos fractionization along with RUQ [place]  6) CRC screening -Cologuard was negative in 2017.  Prior to that normal colonoscopy 2007. -Per current guidelines, due for repeat CRC screening.    Patient would like to wait until September to have this done  7) Constipation 8) Abdominal bloating -Intermittent constipation and abdominal cramping, mostly centered around recent life stressors -Trial course of Levsin 0.125 mg as needed -Okay to use Dulcolax for feeling of incomplete evacuation -Continue high-fiber diet and plenty of fluids -Provided with fiber chart handout and detailed instructions     The indications, risks, and benefits of colonoscopy were explained to the patient in detail. Risks include but are not limited to bleeding, perforation, adverse reaction to medications, and cardiopulmonary compromise. Sequelae include but are not limited to the possibility of surgery, hospitalization, and mortality. The patient verbalized  understanding and wished to proceed. All questions answered, referred to the scheduler and bowel prep ordered. Further recommendations pending results of the exam.    RTC in 6 months or sooner prn   Lavena Bullion ,DO, FACG 05/06/2020, 8:58 AM

## 2020-05-08 ENCOUNTER — Other Ambulatory Visit: Payer: Self-pay | Admitting: Family Medicine

## 2020-05-08 ENCOUNTER — Other Ambulatory Visit: Payer: Self-pay

## 2020-05-08 ENCOUNTER — Ambulatory Visit (INDEPENDENT_AMBULATORY_CARE_PROVIDER_SITE_OTHER): Payer: Medicare Other | Admitting: Family Medicine

## 2020-05-08 ENCOUNTER — Encounter: Payer: Self-pay | Admitting: Family Medicine

## 2020-05-08 VITALS — BP 122/62 | HR 76 | Temp 98.3°F | Resp 12 | Ht 61.0 in | Wt 176.6 lb

## 2020-05-08 DIAGNOSIS — R1310 Dysphagia, unspecified: Secondary | ICD-10-CM | POA: Diagnosis not present

## 2020-05-08 DIAGNOSIS — E785 Hyperlipidemia, unspecified: Secondary | ICD-10-CM

## 2020-05-08 DIAGNOSIS — E1169 Type 2 diabetes mellitus with other specified complication: Secondary | ICD-10-CM | POA: Diagnosis not present

## 2020-05-08 DIAGNOSIS — E669 Obesity, unspecified: Secondary | ICD-10-CM

## 2020-05-08 DIAGNOSIS — I10 Essential (primary) hypertension: Secondary | ICD-10-CM | POA: Diagnosis not present

## 2020-05-08 LAB — COMPREHENSIVE METABOLIC PANEL
ALT: 14 U/L (ref 0–35)
AST: 18 U/L (ref 0–37)
Albumin: 4.1 g/dL (ref 3.5–5.2)
Alkaline Phosphatase: 101 U/L (ref 39–117)
BUN: 12 mg/dL (ref 6–23)
CO2: 25 mEq/L (ref 19–32)
Calcium: 9.5 mg/dL (ref 8.4–10.5)
Chloride: 100 mEq/L (ref 96–112)
Creatinine, Ser: 0.98 mg/dL (ref 0.40–1.20)
GFR: 67.15 mL/min (ref >60.00)
Glucose, Bld: 197 mg/dL — ABNORMAL HIGH (ref 70–99)
Potassium: 4.2 mEq/L (ref 3.5–5.1)
Sodium: 135 mEq/L (ref 135–145)
Total Bilirubin: 0.8 mg/dL (ref 0.2–1.2)
Total Protein: 7.3 g/dL (ref 6.0–8.3)

## 2020-05-08 LAB — LIPID PANEL
Cholesterol: 129 mg/dL (ref 0–200)
HDL: 61.3 mg/dL (ref >39.00)
LDL Cholesterol: 43 mg/dL (ref 0–99)
NonHDL: 68.1
Total CHOL/HDL Ratio: 2
Triglycerides: 125 mg/dL (ref 0.0–149.0)
VLDL: 25 mg/dL (ref 0.0–40.0)

## 2020-05-08 LAB — HEMOGLOBIN A1C: Hgb A1c MFr Bld: 8.2 % — ABNORMAL HIGH (ref 4.6–6.5)

## 2020-05-08 LAB — CBC
HCT: 38.4 % (ref 36.0–46.0)
Hemoglobin: 12.8 g/dL (ref 12.0–15.0)
MCHC: 33.4 g/dL (ref 30.0–36.0)
MCV: 93.1 fl (ref 78.0–100.0)
Platelets: 260 10*3/uL (ref 150.0–400.0)
RBC: 4.13 Mil/uL (ref 3.87–5.11)
RDW: 13.9 % (ref 11.5–15.5)
WBC: 7 10*3/uL (ref 4.0–10.5)

## 2020-05-08 LAB — TSH: TSH: 1.12 u[IU]/mL (ref 0.35–4.50)

## 2020-05-08 MED ORDER — NITROGLYCERIN 0.4 MG SL SUBL: 0.4000 mg | SUBLINGUAL_TABLET | SUBLINGUAL | 1 refills | Status: AC | PRN

## 2020-05-08 NOTE — Assessment & Plan Note (Signed)
Tolerating statin, encouraged heart healthy diet, avoid trans fats, minimize simple carbs and saturated fats. Increase exercise as tolerated 

## 2020-05-08 NOTE — Assessment & Plan Note (Signed)
Doing much better after seeing GI and having her espophagus stretched

## 2020-05-08 NOTE — Progress Notes (Signed)
Subjective:    Patient ID: Heather Roberts, female    DOB: 09-Mar-1946, 74 y.o.   MRN: [id number]  Chief Complaint  Patient presents with  . Follow-up    HPI Patient is in today for follow up on chronic medical concerns. No recent febrile illness or hospitalizations. She continues to have trouble with knee and back pain but it is stable and manageable. Her blood sugars are well controlled int he 90 to 120 range mostly. Highest was 160s but this is unusal. No polyuria or polydipsia. Denies CP/palp/SOB/HA/congestion/fevers/GI or GU c/o. Taking meds as prescribed Past Medical History:  Diagnosis Date  . Allergic rhinitis   . Anemia 03/19/2013  . Anxiety   . Bouchard nodes (DJD hand)   . Carotid artery disease ([place]) 03/24/2014  . Cervical cancer screening 07/31/2015  . Dense breast tissue 03/03/2017  . Diabetes mellitus type II   . DJD (degenerative joint disease), lumbar   . Fibroids   . Glaucoma   . H/O diverticulitis of colon 03/03/2017  . History of DVT (deep vein thrombosis) 1994  . Hyperlipidemia   . Hypertension   . Insomnia 02/03/2016  . Nocturia 12/18/2012  . Obesity 07/31/2015  . Palpitations 12/18/2012    Past Surgical History:  Procedure Laterality Date  . ABDOMINAL HYSTERECTOMY    . CHOLECYSTECTOMY    . COLONOSCOPY     approx 8 years ago  . EYE SURGERY  09/2014, 08/2014   Cataracts removal  . KNEE ARTHROSCOPY Bilateral    Left 2007, Right 1999    Family History  Problem Relation Age of Onset  . Stroke Sister   . Diabetes Mother   . Kidney disease Mother   . COPD Father        smoker  . Stroke Sister   . Breast cancer Sister        s/p mastectomy  . COPD Brother   . Stroke Brother   . Stroke Other   . Diabetes Paternal Grandmother   . Colon cancer Neg Hx   . Liver cancer Neg Hx     Social History   Socioeconomic History  . Marital status: Married    Spouse name: Essick  . Number of children: 3  . Years of education: Not on file  . Highest  education level: Not on file  Occupational History  . Occupation: Scientist, research (physical sciences): RETIRED    Comment: used to work for [employer]  . Smoking status: Former Smoker    Packs/day: 0.30    Years: 8.00    Pack years: 2.40    Types: Cigarettes  . Smokeless tobacco: Never Used  Vaping Use  . Vaping Use: Never used  Substance and Sexual Activity  . Alcohol use: Yes    Alcohol/week: 0.0 standard drinks    Comment: Rarely---glass of wine with a meeting  . Drug use: No  . Sexual activity: Yes    Partners: Male    Comment: lives with husband, no dietary restrictions, minimizes dairy is walking more now  Other Topics Concern  . 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 Difficulty of Paying  Living Expenses:   Food Insecurity:   . Worried About Charity fundraiser in the Last Year:   . Arboriculturist in the Last Year:   Transportation Needs:   . Film/video editor (Medical):   Marland Kitchen Lack of Transportation (Non-Medical):   Physical Activity:   . Days of Exercise per Week:   . Minutes of Exercise per Session:   Stress:   . Feeling of Stress :   Social Connections:   . Frequency of Communication with Friends and Family:   . Frequency of Social Gatherings with Friends and Family:   . Attends Religious Services:   . Active Member of Clubs or Organizations:   . Attends Archivist Meetings:   Marland Kitchen Marital Status:   Intimate Partner Violence:   . Fear of Current or Ex-Partner:   . Emotionally Abused:   Marland Kitchen Physically Abused:   . Sexually Abused:     Outpatient Medications Prior to Visit  Medication Sig Dispense Refill  . amLODipine (NORVASC) 10 MG tablet Take 1 tablet (10 mg total) by mouth daily. 30 tablet 5  . aspirin 81 MG tablet Take  81 mg by mouth daily.      . Cyanocobalamin (B-12) 1000 MCG CAPS Take 1 tablet by mouth as needed.     . fluticasone (FLONASE) 50 MCG/ACT nasal spray Place 2 sprays into both nostrils daily as needed for allergies or rhinitis. 16 g 6  . glucose blood (GLUCOSE METER TEST) test strip To use with onetouch verio flex DX E11.9 100 each 6  . glucose blood (ONETOUCH VERIO) test strip Check blood sugar once daily.  DX E11.9 100 each 6  . hydrochlorothiazide (HYDRODIURIL) 12.5 MG tablet Take 1 tablet (12.5 mg total) by mouth daily. 30 tablet 5  . hyoscyamine (LEVSIN SL) 0.125 MG SL tablet Place 1 tablet (0.125 mg total) under the tongue every 4 (four) hours as needed (as needed for bloating, abdominal pain). 30 tablet 3  . losartan (COZAAR) 100 MG tablet Take 1 tablet (100 mg total) by mouth daily. 90 tablet 1  . metFORMIN (GLUCOPHAGE-XR) 500 MG 24 hr tablet Take 1 tablet (500 mg total) by mouth in the morning and at bedtime. 180 tablet 1  . mometasone (NASONEX) 50 MCG/ACT nasal spray Place 2 sprays into the nose in the morning.     . rosuvastatin (CRESTOR) 20 MG tablet TAKE 1 TABLET BY MOUTH EVERYDAY AT BEDTIME 90 tablet 1  . tiZANidine (ZANAFLEX) 2 MG tablet Take 0.5-2 tablets (1-4 mg total) by mouth every 8 (eight) hours as needed for muscle spasms. 40 tablet 1  . nitroGLYCERIN (NITROSTAT) 0.4 MG SL tablet Place 1 tablet (0.4 mg total) under the tongue every 5 (five) minutes as needed for chest pain. 25 tablet 1  . omeprazole (PRILOSEC) 20 MG capsule Take 1 capsule (20 mg total) by mouth 2 (two) times daily before a meal. 84 capsule 1   No facility-administered medications prior to visit.    Allergies  Allergen Reactions  . Tussionex Pennkinetic Er [Hydrocod Polst-Cpm Polst Er] Other (See Comments)    Upset stomach and loose stool     Review of Systems  Constitutional: Negative for fever and malaise/fatigue.  HENT: Negative for congestion.   Eyes: Negative for blurred vision.  Respiratory:  Negative for shortness of breath.   Cardiovascular: Negative for chest pain, palpitations and leg swelling.  Gastrointestinal: Negative for abdominal pain, blood in stool and nausea.  Genitourinary: Negative for  dysuria and frequency.  Musculoskeletal: Positive for back pain and joint pain. Negative for falls.  Skin: Negative for rash.  Neurological: Negative for dizziness, loss of consciousness and headaches.  Endo/Heme/Allergies: Negative for environmental allergies.  Psychiatric/Behavioral: Negative for depression. The patient is not nervous/anxious.        Objective:    Physical Exam Vitals and nursing note reviewed.  Constitutional:      General: She is not in acute distress.    Appearance: She is well-developed.  HENT:     Head: Normocephalic and atraumatic.     Nose: Nose normal.  Eyes:     General:        Right eye: No discharge.        Left eye: No discharge.  Cardiovascular:     Rate and Rhythm: Normal rate and regular rhythm.     Heart sounds: No murmur heard.   Pulmonary:     Effort: Pulmonary effort is normal.     Breath sounds: Normal breath sounds.  Abdominal:     General: Bowel sounds are normal.     Palpations: Abdomen is soft.     Tenderness: There is no abdominal tenderness.  Musculoskeletal:     Cervical back: Normal range of motion and neck supple.  Skin:    General: Skin is warm and dry.  Neurological:     Mental Status: She is alert and oriented to person, place, and time.     BP 122/62 (BP Location: Left Arm, Cuff Size: Large)   Pulse 76   Temp 98.3 F (36.8 C) (Oral)   Resp 12   Ht 5\' 1"  (1.549 m)   Wt 176 lb 9.6 oz (80.1 kg)   SpO2 98%   BMI 33.37 kg/m  Wt Readings from Last 3 Encounters:  05/08/20 176 lb 9.6 oz (80.1 kg)  05/06/20 177 lb (80.3 kg)  03/19/20 180 lb (81.6 kg)    Diabetic Foot Exam - Simple   No data filed     Lab Results  Component Value Date   WBC 6.0 12/28/2019   HGB 12.6 12/28/2019   HCT 37.4 12/28/2019    PLT 238.0 12/28/2019   GLUCOSE 143 (H) 12/28/2019   CHOL 148 12/28/2019   TRIG 86.0 12/28/2019   HDL 60.10 12/28/2019   LDLDIRECT 118.5 10/04/2007   LDLCALC 71 12/28/2019   ALT 15 12/28/2019   AST 21 12/28/2019   NA 137 12/28/2019   K 4.5 12/28/2019   CL 102 12/28/2019   CREATININE 0.96 12/28/2019   BUN 12 12/28/2019   CO2 27 12/28/2019   TSH 1.30 12/28/2019   HGBA1C 8.1 (H) 12/28/2019   MICROALBUR 0.50 06/14/2012    Lab Results  Component Value Date   TSH 1.30 12/28/2019   Lab Results  Component Value Date   WBC 6.0 12/28/2019   HGB 12.6 12/28/2019   HCT 37.4 12/28/2019   MCV 94.0 12/28/2019   PLT 238.0 12/28/2019   Lab Results  Component Value Date   NA 137 12/28/2019   K 4.5 12/28/2019   CO2 27 12/28/2019   GLUCOSE 143 (H) 12/28/2019   BUN 12 12/28/2019   CREATININE 0.96 12/28/2019   BILITOT 1.0 12/28/2019   ALKPHOS 124 (H) 12/28/2019   AST 21 12/28/2019   ALT 15 12/28/2019   PROT 7.5 12/28/2019   ALBUMIN 4.2 12/28/2019   CALCIUM 9.8 12/28/2019   GFR 68.83 12/28/2019   Lab Results  Component Value Date   CHOL 148 12/28/2019  Lab Results  Component Value Date   HDL 60.10 12/28/2019   Lab Results  Component Value Date   LDLCALC 71 12/28/2019   Lab Results  Component Value Date   TRIG 86.0 12/28/2019   Lab Results  Component Value Date   CHOLHDL 2 12/28/2019   Lab Results  Component Value Date   HGBA1C 8.1 (H) 12/28/2019       Assessment & Plan:   Problem List Items Addressed This Visit    Diabetes mellitus type 2 in obese ([place]) - Primary    hgba1c acceptable, minimize simple carbs. Increase exercise as tolerated. Continue current meds      Relevant Orders   Hemoglobin A1c   Hyperlipidemia associated with type 2 diabetes mellitus ([place])    Tolerating statin, encouraged heart healthy diet, avoid trans fats, minimize simple carbs and saturated fats. Increase exercise as tolerated      Relevant Orders   Lipid panel   Essential  hypertension    Well controlled, no changes to meds. Encouraged heart healthy diet such as the DASH diet and exercise as tolerated.       Relevant Medications   nitroGLYCERIN (NITROSTAT) 0.4 MG SL tablet   Other Relevant Orders   CBC   Comprehensive metabolic panel   TSH   Dysphagia    Doing much better after seeing GI and having her espophagus stretched         I am having Council Mechanic. Garfield Cornea maintain her aspirin, fluticasone, B-12, glucose blood, glucose blood, mometasone, hydrochlorothiazide, losartan, amLODipine, tiZANidine, omeprazole, rosuvastatin, metFORMIN, hyoscyamine, and nitroGLYCERIN.  Meds ordered this encounter  Medications  . nitroGLYCERIN (NITROSTAT) 0.4 MG SL tablet    Sig: Place 1 tablet (0.4 mg total) under the tongue every 5 (five) minutes as needed for chest pain.    Dispense:  25 tablet    Refill:  1     [name], MD

## 2020-05-08 NOTE — Assessment & Plan Note (Signed)
hgba1c acceptable, minimize simple carbs. Increase exercise as tolerated. Continue current meds 

## 2020-05-08 NOTE — Patient Instructions (Signed)
Shingrix is the new shingles shot, 2 shots over 2-6 months cheaper at pharmacy Carbohydrate Counting for Diabetes Mellitus, Adult  Carbohydrate counting is a method of keeping track of how many carbohydrates you eat. Eating carbohydrates naturally increases the amount of sugar (glucose) in the blood. Counting how many carbohydrates you eat helps keep your blood glucose within normal limits, which helps you manage your diabetes (diabetes mellitus). It is important to know how many carbohydrates you can safely have in each meal. This is different for every person. A diet and nutrition specialist (registered dietitian) can help you make a meal plan and calculate how many carbohydrates you should have at each meal and snack. Carbohydrates are found in the following foods:  Grains, such as breads and cereals.  Dried beans and soy products.  Starchy vegetables, such as potatoes, peas, and corn.  Fruit and fruit juices.  Milk and yogurt.  Sweets and snack foods, such as cake, cookies, candy, chips, and soft drinks. How do I count carbohydrates? There are two ways to count carbohydrates in food. You can use either of the methods or a combination of both. Reading "Nutrition Facts" on packaged food The "Nutrition Facts" list is included on the labels of almost all packaged foods and beverages in the [place] It includes:  The serving size.  Information about nutrients in each serving, including the grams (g) of carbohydrate per serving. To use the "Nutrition Facts":  Decide how many servings you will have.  Multiply the number of servings by the number of carbohydrates per serving.  The resulting number is the total amount of carbohydrates that you will be having. Learning standard serving sizes of other foods When you eat carbohydrate foods that are not packaged or do not include "Nutrition Facts" on the label, you need to measure the servings in order to count the amount of  carbohydrates:  Measure the foods that you will eat with a food scale or measuring cup, if needed.  Decide how many standard-size servings you will eat.  Multiply the number of servings by 15. Most carbohydrate-rich foods have about 15 g of carbohydrates per serving. ? For example, if you eat 8 oz (170 g) of strawberries, you will have eaten 2 servings and 30 g of carbohydrates (2 servings x 15 g = 30 g).  For foods that have more than one food mixed, such as soups and casseroles, you must count the carbohydrates in each food that is included. The following list contains standard serving sizes of common carbohydrate-rich foods. Each of these servings has about 15 g of carbohydrates:   hamburger bun or  English muffin.   oz (15 mL) syrup.   oz (14 g) jelly.  1 slice of bread.  1 six-inch tortilla.  3 oz (85 g) cooked rice or pasta.  4 oz (113 g) cooked dried beans.  4 oz (113 g) starchy vegetable, such as peas, corn, or potatoes.  4 oz (113 g) hot cereal.  4 oz (113 g) mashed potatoes or  of a large baked potato.  4 oz (113 g) canned or frozen fruit.  4 oz (120 mL) fruit juice.  4-6 crackers.  6 chicken nuggets.  6 oz (170 g) unsweetened dry cereal.  6 oz (170 g) plain fat-free yogurt or yogurt sweetened with artificial sweeteners.  8 oz (240 mL) milk.  8 oz (170 g) fresh fruit or one small piece of fruit.  24 oz (680 g) popped popcorn. Example of carbohydrate counting  Sample meal  3 oz (85 g) chicken breast.  6 oz (170 g) brown rice.  4 oz (113 g) corn.  8 oz (240 mL) milk.  8 oz (170 g) strawberries with sugar-free whipped topping. Carbohydrate calculation 1. Identify the foods that contain carbohydrates: ? Rice. ? Corn. ? Milk. ? Strawberries. 2. Calculate how many servings you have of each food: ? 2 servings rice. ? 1 serving corn. ? 1 serving milk. ? 1 serving strawberries. 3. Multiply each number of servings by 15 g: ? 2 servings rice  x 15 g = 30 g. ? 1 serving corn x 15 g = 15 g. ? 1 serving milk x 15 g = 15 g. ? 1 serving strawberries x 15 g = 15 g. 4. Add together all of the amounts to find the total grams of carbohydrates eaten: ? 30 g + 15 g + 15 g + 15 g = 75 g of carbohydrates total. Summary  Carbohydrate counting is a method of keeping track of how many carbohydrates you eat.  Eating carbohydrates naturally increases the amount of sugar (glucose) in the blood.  Counting how many carbohydrates you eat helps keep your blood glucose within normal limits, which helps you manage your diabetes.  A diet and nutrition specialist (registered dietitian) can help you make a meal plan and calculate how many carbohydrates you should have at each meal and snack. This information is not intended to replace advice given to you by your health care provider. Make sure you discuss any questions you have with your health care provider. Document Revised: 04/28/2017 Document Reviewed: 03/17/2016 Elsevier Patient Education  [employer].

## 2020-05-08 NOTE — Assessment & Plan Note (Signed)
Well controlled, no changes to meds. Encouraged heart healthy diet such as the DASH diet and exercise as tolerated.  °

## 2020-05-09 ENCOUNTER — Other Ambulatory Visit: Payer: Self-pay | Admitting: *Deleted

## 2020-05-09 DIAGNOSIS — E1169 Type 2 diabetes mellitus with other specified complication: Secondary | ICD-10-CM

## 2020-05-09 DIAGNOSIS — E669 Obesity, unspecified: Secondary | ICD-10-CM

## 2020-05-09 MED ORDER — METFORMIN HCL ER 500 MG PO TB24: 500.0000 mg | ORAL_TABLET | Freq: Three times a day (TID) | ORAL | 1 refills | Status: DC

## 2020-06-06 ENCOUNTER — Telehealth: Payer: Self-pay | Admitting: Gastroenterology

## 2020-06-25 ENCOUNTER — Encounter: Payer: Medicare Other | Admitting: Gastroenterology

## 2020-06-27 ENCOUNTER — Encounter: Payer: Medicare Other | Admitting: Gastroenterology

## 2020-07-01 ENCOUNTER — Ambulatory Visit (AMBULATORY_SURGERY_CENTER): Payer: Medicare Other | Admitting: Gastroenterology

## 2020-07-01 ENCOUNTER — Encounter: Payer: Self-pay | Admitting: Gastroenterology

## 2020-07-01 ENCOUNTER — Other Ambulatory Visit: Payer: Self-pay

## 2020-07-01 VITALS — BP 134/48 | HR 62 | Temp 95.6°F | Resp 14 | Ht 61.0 in | Wt 177.0 lb

## 2020-07-01 DIAGNOSIS — D125 Benign neoplasm of sigmoid colon: Secondary | ICD-10-CM | POA: Diagnosis not present

## 2020-07-01 DIAGNOSIS — K5900 Constipation, unspecified: Secondary | ICD-10-CM

## 2020-07-01 DIAGNOSIS — I10 Essential (primary) hypertension: Secondary | ICD-10-CM | POA: Diagnosis not present

## 2020-07-01 DIAGNOSIS — R140 Abdominal distension (gaseous): Secondary | ICD-10-CM

## 2020-07-01 DIAGNOSIS — Z1212 Encounter for screening for malignant neoplasm of rectum: Secondary | ICD-10-CM | POA: Diagnosis not present

## 2020-07-01 DIAGNOSIS — K5730 Diverticulosis of large intestine without perforation or abscess without bleeding: Secondary | ICD-10-CM

## 2020-07-01 DIAGNOSIS — Z1211 Encounter for screening for malignant neoplasm of colon: Secondary | ICD-10-CM

## 2020-07-01 DIAGNOSIS — E119 Type 2 diabetes mellitus without complications: Secondary | ICD-10-CM | POA: Diagnosis not present

## 2020-07-01 DIAGNOSIS — I2510 Atherosclerotic heart disease of native coronary artery without angina pectoris: Secondary | ICD-10-CM | POA: Diagnosis not present

## 2020-07-01 DIAGNOSIS — K641 Second degree hemorrhoids: Secondary | ICD-10-CM

## 2020-07-01 HISTORY — PX: COLONOSCOPY: SHX174

## 2020-07-01 MED ORDER — SODIUM CHLORIDE 0.9 % IV SOLN: 500.0000 mL | Freq: Once | INTRAVENOUS | Status: DC

## 2020-07-01 NOTE — Progress Notes (Signed)
Report to PACU, RN, vss, BBS= Clear.  

## 2020-07-01 NOTE — Patient Instructions (Signed)
Handouts given:  Polyps, Hemorrhoids, Diverticulosis Continue present medications  await pathology results Resume previous diet  YOU HAD AN ENDOSCOPIC PROCEDURE TODAY AT [facility]:   Refer to the procedure report that was given to you for any specific questions about what was found during the examination.  If the procedure report does not answer your questions, please call your gastroenterologist to clarify.  If you requested that your care partner not be given the details of your procedure findings, then the procedure report has been included in a sealed envelope for you to review at your convenience later.  YOU SHOULD EXPECT: Some feelings of bloating in the abdomen. Passage of more gas than usual.  Walking can help get rid of the air that was put into your GI tract during the procedure and reduce the bloating. If you had a lower endoscopy (such as a colonoscopy or flexible sigmoidoscopy) you may notice spotting of blood in your stool or on the toilet paper. If you underwent a bowel prep for your procedure, you may not have a normal bowel movement for a few days.  Please Note:  You might notice some irritation and congestion in your nose or some drainage.  This is from the oxygen used during your procedure.  There is no need for concern and it should clear up in a day or so.  SYMPTOMS TO REPORT IMMEDIATELY:   Following lower endoscopy (colonoscopy or flexible sigmoidoscopy):  Excessive amounts of blood in the stool  Significant tenderness or worsening of abdominal pains  Swelling of the abdomen that is new, acute  Fever of 100F or higher   For urgent or emergent issues, a gastroenterologist can be reached at any hour by calling [phone]. Do not use MyChart messaging for urgent concerns.    DIET:  We do recommend a small meal at first, but then you may proceed to your regular diet.  Drink plenty of fluids but you should avoid alcoholic beverages for 24  hours.  ACTIVITY:  You should plan to take it easy for the rest of today and you should NOT DRIVE or use heavy machinery until tomorrow (because of the sedation medicines used during the test).    FOLLOW UP: Our staff will call the number listed on your records 48-72 hours following your procedure to check on you and address any questions or concerns that you may have regarding the information given to you following your procedure. If we do not reach you, we will leave a message.  We will attempt to reach you two times.  During this call, we will ask if you have developed any symptoms of COVID 19. If you develop any symptoms (ie: fever, flu-like symptoms, shortness of breath, cough etc.) before then, please call [phone].  If you test positive for Covid 19 in the 2 weeks post procedure, please call and report this information to [place].    If any biopsies were taken you will be contacted by phone or by letter within the next 1-3 weeks.  Please call us at [phone] if you have not heard about the biopsies in 3 weeks.    SIGNATURES/CONFIDENTIALITY: You and/or your care partner have signed paperwork which will be entered into your electronic medical record.  These signatures attest to the fact that that the information above on your After Visit Summary has been reviewed and is understood.  Full responsibility of the confidentiality of this discharge information lies with you and/or your care-partner.

## 2020-07-01 NOTE — Progress Notes (Signed)
Pt's states no medical or surgical changes since previsit or office visit.  [facility] [place] 

## 2020-07-01 NOTE — Op Note (Signed)
[facility] Patient Name: Heather Roberts Procedure Date: 07/01/2020 8:50 AM MRN: [id number] Endoscopist: Gerrit Heck , MD Age: 74 Referring MD:  Date of Birth: [birth year] Gender: Female Account #: [id number] Procedure:                Colonoscopy Indications:              Screening for colorectal malignant neoplasm (last                            colonoscopy was more than 10 years ago)                           Last colonoscopy was in 2007 and was normal.                            ColoGuard in 2017 was negative. Presents today for                            ongoing colon cancer screening. Medicines:                Monitored Anesthesia Care Procedure:                Pre-Anesthesia Assessment:                           - Prior to the procedure, a History and Physical                            was performed, and patient medications and                            allergies were reviewed. The patient's tolerance of                            previous anesthesia was also reviewed. The risks                            and benefits of the procedure and the sedation                            options and risks were discussed with the patient.                            All questions were answered, and informed consent                            was obtained. Prior Anticoagulants: The patient has                            taken no previous anticoagulant or antiplatelet                            agents. ASA Grade Assessment: II - A patient with  mild systemic disease. After reviewing the risks                            and benefits, the patient was deemed in                            satisfactory condition to undergo the procedure.                           After obtaining informed consent, the colonoscope                            was passed under direct vision. Throughout the                            procedure, the patient's blood  pressure, pulse, and                            oxygen saturations were monitored continuously. The                            Colonoscope was introduced through the anus and                            advanced to the the cecum, identified by                            appendiceal orifice and ileocecal valve. The                            colonoscopy was performed without difficulty. The                            patient tolerated the procedure well. The quality                            of the bowel preparation was good. The ileocecal                            valve, appendiceal orifice, and rectum were                            photographed. Scope In: 8:55:03 AM Scope Out: 9:09:33 AM Scope Withdrawal Time: 0 hours 9 minutes 48 seconds  Total Procedure Duration: 0 hours 14 minutes 30 seconds  Findings:                 Hemorrhoids were found on perianal exam.                           A 5 mm polyp was found in the sigmoid colon. The                            polyp was sessile. The polyp was removed with a  cold snare. Resection and retrieval were complete.                            Estimated blood loss was minimal.                           Multiple small-mouthed diverticula were found in                            the sigmoid colon.                           Non-bleeding internal hemorrhoids were found during                            retroflexion. The hemorrhoids were small.                           Mild looping in the ascending colon which required                            external abdominal pressure to fully intubate the                            cecum. The exam was otherwise normal throughout the                            remainder of the colon. Complications:            No immediate complications. Estimated Blood Loss:     Estimated blood loss was minimal. Impression:               - Hemorrhoids found on perianal exam.                            - One 5 mm polyp in the sigmoid colon, removed with                            a cold snare. Resected and retrieved.                           - Diverticulosis in the sigmoid colon.                           - Non-bleeding internal hemorrhoids. Recommendation:           - Patient has a contact number available for                            emergencies. The signs and symptoms of potential                            delayed complications were discussed with the                            patient. Return to normal activities tomorrow.  Written discharge instructions were provided to the                            patient.                           - Resume previous diet.                           - Continue present medications.                           - Await pathology results.                           - Repeat colonoscopy for surveillance based on                            pathology results.                           - Return to GI office PRN. Gerrit Heck, MD 07/01/2020 9:15:16 AM

## 2020-07-03 ENCOUNTER — Telehealth: Payer: Self-pay

## 2020-07-03 NOTE — Telephone Encounter (Signed)
°  Follow up Call-  Call back number 07/01/2020 03/19/2020  Post procedure Call Back phone  # [phone]  Permission to leave phone message Yes Yes  Some recent data might be hidden     Patient questions:  Do you have a fever, pain , or abdominal swelling? No. Pain Score  0 *  Have you tolerated food without any problems? Yes.    Have you been able to return to your normal activities? Yes.    Do you have any questions about your discharge instructions: Diet   No. Medications  No. Follow up visit  No.  Do you have questions or concerns about your Care? No.  Actions: * If pain score is 4 or above: No action needed, pain <4.  1. Have you developed a fever since your procedure? no  2.   Have you had an respiratory symptoms (SOB or cough) since your procedure? no  3.   Have you tested positive for COVID 19 since your procedure no  4.   Have you had any family members/close contacts diagnosed with the COVID 19 since your procedure?  no   If yes to any of these questions please route to Joylene John, RN and Joella Prince, RN

## 2020-07-08 ENCOUNTER — Encounter: Payer: Self-pay | Admitting: Gastroenterology

## 2020-07-15 ENCOUNTER — Telehealth: Payer: Self-pay | Admitting: Gastroenterology

## 2020-07-15 ENCOUNTER — Telehealth: Payer: Self-pay | Admitting: Family Medicine

## 2020-07-15 MED ORDER — TIZANIDINE HCL 2 MG PO TABS: 1.0000 mg | ORAL_TABLET | Freq: Three times a day (TID) | ORAL | 1 refills | Status: DC | PRN

## 2020-07-15 NOTE — Telephone Encounter (Signed)
Patient returned phone call.  Please call back, pt said " it is very important"

## 2020-07-15 NOTE — Telephone Encounter (Signed)
Medication: tiZANidine (ZANAFLEX) 2 MG tablet [[id number]]   Has the patient contacted their pharmacy? No. (If no, request that the patient contact the pharmacy for the refill.) (If yes, when and what did the pharmacy advise?)  Preferred Pharmacy (with phone number or street name): [id number] - [place], [place]  [place], [place] [address]  Phone:  [phone] Fax:  [phone]  DEA #:  [id number]  Agent: Please be advised that RX refills may take up to 3 business days. We ask that you follow-up with your pharmacy.

## 2020-07-15 NOTE — Telephone Encounter (Signed)
Patient states states hand pain after coloscopy . Patient states pain goes from hand where iv was placed throughout her neck.

## 2020-07-15 NOTE — Telephone Encounter (Signed)
LMOM for patient to call back.

## 2020-07-15 NOTE — Telephone Encounter (Signed)
Refills sent

## 2020-07-16 DIAGNOSIS — Z23 Encounter for immunization: Secondary | ICD-10-CM | POA: Diagnosis not present

## 2020-07-16 NOTE — Telephone Encounter (Signed)
Spoke to patient who states that she contacted her PCP regarding hand pain from IV placement. She was given a prescription for a muscle relaxer,and reports her hand feels much better.

## 2020-08-08 ENCOUNTER — Telehealth: Payer: Self-pay | Admitting: *Deleted

## 2020-08-08 ENCOUNTER — Other Ambulatory Visit: Payer: Self-pay

## 2020-08-08 DIAGNOSIS — E1169 Type 2 diabetes mellitus with other specified complication: Secondary | ICD-10-CM

## 2020-08-08 DIAGNOSIS — E785 Hyperlipidemia, unspecified: Secondary | ICD-10-CM

## 2020-08-08 DIAGNOSIS — I10 Essential (primary) hypertension: Secondary | ICD-10-CM

## 2020-08-08 NOTE — Telephone Encounter (Signed)
Pt has lab appt Monday but I do not see any orders in Epic.  Please place orders if appropriate.

## 2020-08-08 NOTE — Telephone Encounter (Signed)
That's perfect, thank you!

## 2020-08-08 NOTE — Telephone Encounter (Signed)
I added the tests for future and sent it to [facility] please let me know if anything else needs to be changed or added

## 2020-08-11 ENCOUNTER — Other Ambulatory Visit (INDEPENDENT_AMBULATORY_CARE_PROVIDER_SITE_OTHER): Payer: Medicare Other

## 2020-08-11 ENCOUNTER — Telehealth: Payer: Self-pay | Admitting: Family Medicine

## 2020-08-11 ENCOUNTER — Other Ambulatory Visit: Payer: Self-pay

## 2020-08-11 DIAGNOSIS — E669 Obesity, unspecified: Secondary | ICD-10-CM | POA: Diagnosis not present

## 2020-08-11 DIAGNOSIS — E785 Hyperlipidemia, unspecified: Secondary | ICD-10-CM | POA: Diagnosis not present

## 2020-08-11 DIAGNOSIS — I10 Essential (primary) hypertension: Secondary | ICD-10-CM

## 2020-08-11 DIAGNOSIS — E1169 Type 2 diabetes mellitus with other specified complication: Secondary | ICD-10-CM | POA: Diagnosis not present

## 2020-08-11 NOTE — Telephone Encounter (Signed)
I will come get the copy and update pt records

## 2020-08-11 NOTE — Telephone Encounter (Signed)
Pt dropped off copy of Covid Vaccination updated for provider to see and have on pt's chart. Document put at front office tray under providers name.

## 2020-08-12 LAB — BASIC METABOLIC PANEL
BUN/Creatinine Ratio: 14 (calc) (ref 6–22)
BUN: 14 mg/dL (ref 7–25)
CO2: 26 mmol/L (ref 20–32)
Calcium: 9.9 mg/dL (ref 8.6–10.4)
Chloride: 101 mmol/L (ref 98–110)
Creat: 0.99 mg/dL — ABNORMAL HIGH (ref 0.60–0.93)
Glucose, Bld: 165 mg/dL — ABNORMAL HIGH (ref 65–99)
Potassium: 4.2 mmol/L (ref 3.5–5.3)
Sodium: 138 mmol/L (ref 135–146)

## 2020-08-12 LAB — CBC
HCT: 40.8 % (ref 35.0–45.0)
Hemoglobin: 13.6 g/dL (ref 11.7–15.5)
MCH: 30.6 pg (ref 27.0–33.0)
MCHC: 33.3 g/dL (ref 32.0–36.0)
MCV: 91.9 fL (ref 80.0–100.0)
MPV: 10.9 fL (ref 7.5–12.5)
Platelets: 260 10*3/uL (ref 140–400)
RBC: 4.44 10*6/uL (ref 3.80–5.10)
RDW: 12.9 % (ref 11.0–15.0)
WBC: 7.1 10*3/uL (ref 3.8–10.8)

## 2020-08-12 LAB — HEMOGLOBIN A1C
Hgb A1c MFr Bld: 7.8 % of total Hgb — ABNORMAL HIGH (ref <5.7)
Mean Plasma Glucose: 177 (calc)
eAG (mmol/L): 9.8 (calc)

## 2020-08-12 NOTE — Progress Notes (Signed)
Attempted to call pt. LVM to call back.  

## 2020-08-15 ENCOUNTER — Encounter (HOSPITAL_BASED_OUTPATIENT_CLINIC_OR_DEPARTMENT_OTHER): Payer: Self-pay | Admitting: Emergency Medicine

## 2020-08-15 ENCOUNTER — Other Ambulatory Visit: Payer: Self-pay

## 2020-08-15 ENCOUNTER — Emergency Department (HOSPITAL_BASED_OUTPATIENT_CLINIC_OR_DEPARTMENT_OTHER)
Admission: EM | Admit: 2020-08-15 | Discharge: 2020-08-15 | Disposition: A | Discharge status: Home / Self Care | Payer: Medicare Other | Attending: Emergency Medicine | Admitting: Emergency Medicine

## 2020-08-15 DIAGNOSIS — Z87891 Personal history of nicotine dependence: Secondary | ICD-10-CM | POA: Diagnosis not present

## 2020-08-15 DIAGNOSIS — Z7982 Long term (current) use of aspirin: Secondary | ICD-10-CM | POA: Insufficient documentation

## 2020-08-15 DIAGNOSIS — M62838 Other muscle spasm: Secondary | ICD-10-CM | POA: Diagnosis not present

## 2020-08-15 DIAGNOSIS — E1169 Type 2 diabetes mellitus with other specified complication: Secondary | ICD-10-CM | POA: Diagnosis not present

## 2020-08-15 DIAGNOSIS — Z7984 Long term (current) use of oral hypoglycemic drugs: Secondary | ICD-10-CM | POA: Diagnosis not present

## 2020-08-15 DIAGNOSIS — I10 Essential (primary) hypertension: Secondary | ICD-10-CM | POA: Diagnosis not present

## 2020-08-15 DIAGNOSIS — Z79899 Other long term (current) drug therapy: Secondary | ICD-10-CM | POA: Insufficient documentation

## 2020-08-15 MED ORDER — LIDOCAINE 5 % EX PTCH
1.0000 | MEDICATED_PATCH | CUTANEOUS | Status: DC
  Administered 2020-08-15: 1 via TRANSDERMAL
  Filled 2020-08-15: qty 1

## 2020-08-15 MED ORDER — LIDOCAINE 5 % EX PTCH: 1.0000 | MEDICATED_PATCH | CUTANEOUS | 0 refills | Status: DC

## 2020-08-15 MED ORDER — NAPROXEN 375 MG PO TABS: 375.0000 mg | ORAL_TABLET | Freq: Two times a day (BID) | ORAL | 0 refills | Status: DC

## 2020-08-15 MED ORDER — ACETAMINOPHEN 500 MG PO TABS
1000.0000 mg | ORAL_TABLET | Freq: Once | ORAL | Status: AC
  Administered 2020-08-15: 1000 mg via ORAL
  Filled 2020-08-15: qty 2

## 2020-08-15 MED ORDER — NAPROXEN 250 MG PO TABS
500.0000 mg | ORAL_TABLET | Freq: Once | ORAL | Status: AC
  Administered 2020-08-15: 500 mg via ORAL
  Filled 2020-08-15: qty 2

## 2020-08-15 NOTE — ED Provider Notes (Signed)
[facility] EMERGENC[facility] DEPARTMENT Provider Note   CSN: [id number] Arrival date & time: 08/15/20  0445     History Chief Complaint  Patient presents with  . Neck Pain    Heather Roberts is a 74 y.o. female.  The history is provided by the patient.  Illness Location:  Right neck  Quality:  Spasm Severity:  Moderate Onset quality:  Sudden Duration:  5 hours Timing:  Constant Progression:  Unchanged Chronicity:  New Context:  Awoke with neck pain Relieved by:  Nothing  Worsened by:  Turning head to the opposite side  Ineffective treatments:  Zanaflex  Associated symptoms: no abdominal pain, no chest pain, no congestion, no cough, no diarrhea, no ear pain, no fatigue, no fever, no headaches, no loss of consciousness, no myalgias, no nausea, no rash, no rhinorrhea, no shortness of breath, no sore throat, no vomiting and no wheezing   Risk factors:  Elderly       Past Medical History:  Diagnosis Date  . Allergic rhinitis   . Anemia 03/19/2013  . Anxiety   . Bouchard nodes (DJD hand)   . Carotid artery disease ([place]) 03/24/2014  . Cervical cancer screening 07/31/2015  . Dense breast tissue 03/03/2017  . Diabetes mellitus type II   . DJD (degenerative joint disease), lumbar   . Fibroids   . Glaucoma   . H/O diverticulitis of colon 03/03/2017  . History of DVT (deep vein thrombosis) 1994  . Hyperlipidemia   . Hypertension   . Insomnia 02/03/2016  . Nocturia 12/18/2012  . Obesity 07/31/2015  . Palpitations 12/18/2012    Patient Active Problem List   Diagnosis Date Noted  . Dysphagia 11/15/2019  . Tendinitis of right rotator cuff 06/29/2019  . Pedal edema 04/09/2019  . Right hand pain 12/06/2017  . Headache 12/06/2017  . Dense breast tissue 03/03/2017  . H/O diverticulitis of colon 03/03/2017  . Insomnia 02/03/2016  . Right knee pain 01/01/2016  . Metatarsalgia of right foot 01/01/2016  . Cervical cancer screening 07/31/2015  . Obesity 07/31/2015    . Medicare annual wellness visit, subsequent 01/26/2015  . Sinusitis, acute maxillary 09/09/2014  . Carotid artery disease ([place]) 03/24/2014  . Anemia 03/19/2013  . Cerebrovascular disease 02/21/2013  . Chest pain 01/10/2013  . Palpitations 12/18/2012  . Nocturia 12/18/2012  . Joint pain 09/23/2012  . Osteoporosis screening 10/29/2011  . Low back pain 07/16/2011  . Bursitis, trochanteric 07/11/2011  . Left knee pain 10/01/2010  . CATARACTS 09/07/2010  . ECZEMA 08/21/2010  . Pain in joint, lower leg 08/21/2010  . Constipation 09/08/2009  . Anxiety state 08/11/2008  . Diabetes mellitus type 2 in obese ([place]) 08/09/2008  . MENOPAUSAL DISORDER 08/09/2008  . Right shoulder pain 08/09/2008  . Allergic rhinitis 02/08/2008  . Hyperlipidemia associated with type 2 diabetes mellitus ([place]) 08/08/2007  . Essential hypertension 08/08/2007  . DVT, HX OF 08/08/2007  . Osteoarthrosis, hand 08/07/2007  . Osteoarthrosis, unspecified whether generalized or localized, involving lower leg 08/07/2007  . Osteoarthritis of spine 08/07/2007  . ARTHROSCOPY, KNEE, HX OF 08/07/2007    Past Surgical History:  Procedure Laterality Date  . ABDOMINAL HYSTERECTOMY    . CHOLECYSTECTOMY    . COLONOSCOPY     approx 8 years ago  . COLONOSCOPY  07/01/2020  . EYE SURGERY  09/2014, 08/2014   Cataracts removal  . KNEE ARTHROSCOPY Bilateral    Left 2007, Right 1999     OB History    Gravida  3   Para  3   Term  3   Preterm      AB      Living  3     SAB      TAB      Ectopic      Multiple      Live Births              Family History  Problem Relation Age of Onset  . Stroke Sister   . Diabetes Mother   . Kidney disease Mother   . COPD Father        smoker  . Stroke Sister   . Breast cancer Sister        s/p mastectomy  . COPD Brother   . Stroke Brother   . Stroke Other   . Diabetes Paternal Grandmother   . Colon cancer Neg Hx   . Liver cancer Neg Hx     Social History    Tobacco Use  . Smoking status: Former Smoker    Packs/day: 0.30    Years: 8.00    Pack years: 2.40    Types: Cigarettes  . Smokeless tobacco: Never Used  Vaping Use  . Vaping Use: Never used  Substance Use Topics  . Alcohol use: Yes    Alcohol/week: 0.0 standard drinks    Comment: Rarely---glass of wine with a meeting  . Drug use: No    Home Medications Prior to Admission medications   Medication Sig Start Date End Date Taking? Authorizing Provider  amLODipine (NORVASC) 10 MG tablet Take 1 tablet (10 mg total) by mouth daily. 01/29/20   Mosie Lukes, MD  aspirin 81 MG tablet Take 81 mg by mouth daily.      [provider]  Cyanocobalamin (B-12) 1000 MCG CAPS Take 1 tablet by mouth as needed.     [provider]  fluticasone (FLONASE) 50 MCG/ACT nasal spray Place 2 sprays into both nostrils daily as needed for allergies or rhinitis. 12/17/13   Mosie Lukes, MD  glucose blood (GLUCOSE METER TEST) test strip To use with onetouch verio flex DX E11.9 07/31/15   [name] A, MD  glucose blood (ONETOUCH VERIO) test strip Check blood sugar once daily.  DX E11.9 07/31/15   Mosie Lukes, MD  hydrochlorothiazide (HYDRODIURIL) 12.5 MG tablet Take 1 tablet (12.5 mg total) by mouth daily. 06/06/17   Mosie Lukes, MD  hyoscyamine (LEVSIN SL) 0.125 MG SL tablet Place 1 tablet (0.125 mg total) under the tongue every 4 (four) hours as needed (as needed for bloating, abdominal pain). 05/06/20   Cirigliano, Vito V, DO  lidocaine (LIDODERM) 5 % Place 1 patch onto the skin daily. Remove & Discard patch within 12 hours or as directed by MD 08/15/20   Randal Buba, Amar Sippel, MD  losartan (COZAAR) 100 MG tablet Take 1 tablet (100 mg total) by mouth daily. 04/10/18   Mosie Lukes, MD  metFORMIN (GLUCOPHAGE-XR) 500 MG 24 hr tablet Take 1 tablet (500 mg total) by mouth 3 (three) times daily with meals. 05/09/20   Mosie Lukes, MD  mometasone (NASONEX) 50 MCG/ACT nasal spray Place 2  sprays into the nose in the morning.     [provider]  naproxen (NAPROSYN) 375 MG tablet Take 1 tablet (375 mg total) by mouth 2 (two) times daily with a meal. 08/15/20   Aleisha Paone, MD  nitroGLYCERIN (NITROSTAT) 0.4 MG SL tablet Place 1 tablet (0.4 mg total)  under the tongue every 5 (five) minutes as needed for chest pain. 05/08/20   Mosie Lukes, MD  omeprazole (PRILOSEC) 20 MG capsule Take 1 capsule (20 mg total) by mouth 2 (two) times daily before a meal. 03/19/20 07/01/20  Cirigliano, Vito V, DO  rosuvastatin (CRESTOR) 20 MG tablet TAKE 1 TABLET BY MOUTH EVERYDAY AT BEDTIME 04/17/20   Mosie Lukes, MD  tiZANidine (ZANAFLEX) 2 MG tablet Take 0.5-2 tablets (1-4 mg total) by mouth every 8 (eight) hours as needed for muscle spasms. 07/15/20   Mosie Lukes, MD    Allergies    Tussionex pennkinetic er Jerene Canny polst-cpm polst er]  Review of Systems   Review of Systems  Constitutional: Negative for fatigue and fever.  HENT: Negative for congestion, ear pain, rhinorrhea and sore throat.   Eyes: Negative for visual disturbance.  Respiratory: Negative for cough, shortness of breath and wheezing.   Cardiovascular: Negative for chest pain and leg swelling.  Gastrointestinal: Negative for abdominal pain, diarrhea, nausea and vomiting.  Genitourinary: Negative for difficulty urinating.  Musculoskeletal: Negative for myalgias.  Skin: Negative for rash.  Neurological: Negative for loss of consciousness, weakness, numbness and headaches.  Psychiatric/Behavioral: Negative for agitation.  All other systems reviewed and are negative.   Physical Exam Updated Vital Signs BP (!) 168/53 (BP Location: Right Arm)   Pulse 75   Temp 98.2 F (36.8 C) (Oral)   Resp 18   Ht 5\' 1"  (1.549 m)   Wt 80.3 kg   SpO2 100%   BMI 33.45 kg/m   Physical Exam Vitals and nursing note reviewed.  Constitutional:      General: She is not in acute distress.    Appearance: Normal appearance.    HENT:     Head: Normocephalic and atraumatic.     Nose: Nose normal.  Eyes:     Conjunctiva/sclera: Conjunctivae normal.     Pupils: Pupils are equal, round, and reactive to light.  Neck:     Thyroid: No thyroid mass.     Vascular: No carotid bruit.     Comments: Spasm of the trapezius  Cardiovascular:     Rate and Rhythm: Normal rate and regular rhythm.     Pulses: Normal pulses.     Heart sounds: Normal heart sounds.  Pulmonary:     Effort: Pulmonary effort is normal.     Breath sounds: Normal breath sounds.  Abdominal:     General: Abdomen is flat. Bowel sounds are normal.     Palpations: Abdomen is soft.     Tenderness: There is no abdominal tenderness. There is no guarding.  Musculoskeletal:        General: Normal range of motion.     Cervical back: No edema, erythema or rigidity. No spinous process tenderness. Normal range of motion.  Lymphadenopathy:     Cervical: No cervical adenopathy.     Right cervical: No superficial cervical adenopathy.    Left cervical: No superficial cervical adenopathy.  Skin:    General: Skin is warm and dry.     Capillary Refill: Capillary refill takes less than 2 seconds.  Neurological:     General: No focal deficit present.     Mental Status: She is alert and oriented to person, place, and time.     Deep Tendon Reflexes: Reflexes normal.  Psychiatric:        Mood and Affect: Mood normal.        Behavior: Behavior normal.  ED Results / Procedures / Treatments   Labs (all labs ordered are listed, but only abnormal results are displayed) Labs Reviewed - No data to display  EKG None  Radiology No results found.  Procedures Procedures (including critical care time)  Medications Ordered in ED Medications  lidocaine (LIDODERM) 5 % 1 patch (has no administration in time range)  acetaminophen (TYLENOL) tablet 1,000 mg (has no administration in time range)  naproxen (NAPROSYN) tablet 500 mg (has no administration in time  range)    ED Course  I have reviewed the triage vital signs and the nursing notes.  Pertinent labs & imaging results that were available during my care of the patient were reviewed by me and considered in my medical decision making (see chart for details).    MSK spasm of the neck.  Will treat with LIdoderm and NSAIDs and heat therapy.  Drink copious liquids.    Dustine Bertini was evaluated in Emergency Department on 08/15/2020 for the symptoms described in the history of present illness. She was evaluated in the context of the global COVID-19 pandemic, which necessitated consideration that the patient might be at risk for infection with the SARS-CoV-2 virus that causes COVID-19. Institutional protocols and algorithms that pertain to the evaluation of patients at risk for COVID-19 are in a state of rapid change based on information released by regulatory bodies including the CDC and federal and state organizations. These policies and algorithms were followed during the patient's care in the ED.  Final Clinical Impression(s) / ED Diagnoses Final diagnoses:  Muscle spasms of neck   Return for intractable cough, coughing up blood,fevers >100.4 unrelieved by medication, shortness of breath, intractable vomiting, chest pain, shortness of breath, weakness,numbness, changes in speech, facial asymmetry,abdominal pain, passing out,Inability to tolerate liquids or food, cough, altered mental status or any concerns. No signs of systemic illness or infection. The patient is nontoxic-appearing on exam and vital signs are within normal limits.   I have reviewed the triage vital signs and the nursing notes. Pertinent labs &imaging results that were available during my care of the patient were reviewed by me and considered in my medical decision making (see chart for details).After history, exam, and medical workup I feel the patient has beenappropriately medically screened and is safe for  discharge home. Pertinent diagnoses were discussed with the patient. Patient was given return precautions.   Rx / DC Orders ED Discharge Orders         Ordered    naproxen (NAPROSYN) 375 MG tablet  2 times daily with meals        08/15/20 0505    lidocaine (LIDODERM) 5 %  Every 24 hours        08/15/20 0505           Yoav Okane, MD 08/15/20 [id number]

## 2020-08-15 NOTE — ED Triage Notes (Signed)
Patient presents with complaints of right side neck pain; states onset last pm at approx 2300; States she did trip going up the stairs yesterday; denies any known injury from event. Ambulatory with steady gait.

## 2020-08-27 ENCOUNTER — Ambulatory Visit (INDEPENDENT_AMBULATORY_CARE_PROVIDER_SITE_OTHER): Payer: Medicare Other

## 2020-08-27 ENCOUNTER — Other Ambulatory Visit: Payer: Self-pay

## 2020-08-27 DIAGNOSIS — Z23 Encounter for immunization: Secondary | ICD-10-CM | POA: Diagnosis not present

## 2020-09-01 ENCOUNTER — Ambulatory Visit: Payer: Self-pay | Admitting: *Deleted

## 2020-09-16 ENCOUNTER — Other Ambulatory Visit: Payer: Self-pay

## 2020-09-16 ENCOUNTER — Ambulatory Visit (INDEPENDENT_AMBULATORY_CARE_PROVIDER_SITE_OTHER): Payer: Medicare Other | Admitting: Family Medicine

## 2020-09-16 ENCOUNTER — Encounter: Payer: Self-pay | Admitting: Family Medicine

## 2020-09-16 DIAGNOSIS — D649 Anemia, unspecified: Secondary | ICD-10-CM | POA: Diagnosis not present

## 2020-09-16 DIAGNOSIS — E1169 Type 2 diabetes mellitus with other specified complication: Secondary | ICD-10-CM | POA: Diagnosis not present

## 2020-09-16 DIAGNOSIS — I10 Essential (primary) hypertension: Secondary | ICD-10-CM

## 2020-09-16 DIAGNOSIS — E669 Obesity, unspecified: Secondary | ICD-10-CM | POA: Diagnosis not present

## 2020-09-16 DIAGNOSIS — E785 Hyperlipidemia, unspecified: Secondary | ICD-10-CM

## 2020-09-16 DIAGNOSIS — M2550 Pain in unspecified joint: Secondary | ICD-10-CM

## 2020-09-16 DIAGNOSIS — M545 Low back pain, unspecified: Secondary | ICD-10-CM

## 2020-09-16 DIAGNOSIS — G8929 Other chronic pain: Secondary | ICD-10-CM | POA: Diagnosis not present

## 2020-09-16 MED ORDER — AMLODIPINE BESYLATE 10 MG PO TABS: 10.0000 mg | ORAL_TABLET | Freq: Every day | ORAL | 5 refills | Status: DC

## 2020-09-16 MED ORDER — ROSUVASTATIN CALCIUM 20 MG PO TABS
ORAL_TABLET | ORAL | 1 refills | Status: DC
Start: 2020-09-16 — End: 2021-06-30

## 2020-09-16 MED ORDER — TIZANIDINE HCL 2 MG PO TABS
1.0000 mg | ORAL_TABLET | Freq: Three times a day (TID) | ORAL | 1 refills | Status: AC | PRN
Start: 2020-09-16 — End: ?

## 2020-09-16 MED ORDER — METFORMIN HCL ER 500 MG PO TB24: 500.0000 mg | ORAL_TABLET | Freq: Three times a day (TID) | ORAL | 1 refills | Status: DC

## 2020-09-16 NOTE — Assessment & Plan Note (Signed)
Increase leafy greens, consider increased lean red meat and using cast iron cookware. Continue to monitor, report any concerns 

## 2020-09-16 NOTE — Patient Instructions (Signed)
Carbohydrate Counting for Diabetes Mellitus, Adult  Carbohydrate counting is a method of keeping track of how many carbohydrates you eat. Eating carbohydrates naturally increases the amount of sugar (glucose) in the blood. Counting how many carbohydrates you eat helps keep your blood glucose within normal limits, which helps you manage your diabetes (diabetes mellitus). It is important to know how many carbohydrates you can safely have in each meal. This is different for every person. A diet and nutrition specialist (registered dietitian) can help you make a meal plan and calculate how many carbohydrates you should have at each meal and snack. Carbohydrates are found in the following foods:  Grains, such as breads and cereals.  Dried beans and soy products.  Starchy vegetables, such as potatoes, peas, and corn.  Fruit and fruit juices.  Milk and yogurt.  Sweets and snack foods, such as cake, cookies, candy, chips, and soft drinks. How do I count carbohydrates? There are two ways to count carbohydrates in food. You can use either of the methods or a combination of both. Reading "Nutrition Facts" on packaged food The "Nutrition Facts" list is included on the labels of almost all packaged foods and beverages in the [place] It includes:  The serving size.  Information about nutrients in each serving, including the grams (g) of carbohydrate per serving. To use the "Nutrition Facts":  Decide how many servings you will have.  Multiply the number of servings by the number of carbohydrates per serving.  The resulting number is the total amount of carbohydrates that you will be having. Learning standard serving sizes of other foods When you eat carbohydrate foods that are not packaged or do not include "Nutrition Facts" on the label, you need to measure the servings in order to count the amount of carbohydrates:  Measure the foods that you will eat with a food scale or measuring cup, if  needed.  Decide how many standard-size servings you will eat.  Multiply the number of servings by 15. Most carbohydrate-rich foods have about 15 g of carbohydrates per serving. ? For example, if you eat 8 oz (170 g) of strawberries, you will have eaten 2 servings and 30 g of carbohydrates (2 servings x 15 g = 30 g).  For foods that have more than one food mixed, such as soups and casseroles, you must count the carbohydrates in each food that is included. The following list contains standard serving sizes of common carbohydrate-rich foods. Each of these servings has about 15 g of carbohydrates:   hamburger bun or  English muffin.   oz (15 mL) syrup.   oz (14 g) jelly.  1 slice of bread.  1 six-inch tortilla.  3 oz (85 g) cooked rice or pasta.  4 oz (113 g) cooked dried beans.  4 oz (113 g) starchy vegetable, such as peas, corn, or potatoes.  4 oz (113 g) hot cereal.  4 oz (113 g) mashed potatoes or  of a large baked potato.  4 oz (113 g) canned or frozen fruit.  4 oz (120 mL) fruit juice.  4-6 crackers.  6 chicken nuggets.  6 oz (170 g) unsweetened dry cereal.  6 oz (170 g) plain fat-free yogurt or yogurt sweetened with artificial sweeteners.  8 oz (240 mL) milk.  8 oz (170 g) fresh fruit or one small piece of fruit.  24 oz (680 g) popped popcorn. Example of carbohydrate counting Sample meal  3 oz (85 g) chicken breast.  6 oz (170 g)   brown rice.  4 oz (113 g) corn.  8 oz (240 mL) milk.  8 oz (170 g) strawberries with sugar-free whipped topping. Carbohydrate calculation 1. Identify the foods that contain carbohydrates: ? Rice. ? Corn. ? Milk. ? Strawberries. 2. Calculate how many servings you have of each food: ? 2 servings rice. ? 1 serving corn. ? 1 serving milk. ? 1 serving strawberries. 3. Multiply each number of servings by 15 g: ? 2 servings rice x 15 g = 30 g. ? 1 serving corn x 15 g = 15 g. ? 1 serving milk x 15 g = 15 g. ? 1  serving strawberries x 15 g = 15 g. 4. Add together all of the amounts to find the total grams of carbohydrates eaten: ? 30 g + 15 g + 15 g + 15 g = 75 g of carbohydrates total. Summary  Carbohydrate counting is a method of keeping track of how many carbohydrates you eat.  Eating carbohydrates naturally increases the amount of sugar (glucose) in the blood.  Counting how many carbohydrates you eat helps keep your blood glucose within normal limits, which helps you manage your diabetes.  A diet and nutrition specialist (registered dietitian) can help you make a meal plan and calculate how many carbohydrates you should have at each meal and snack. This information is not intended to replace advice given to you by your health care provider. Make sure you discuss any questions you have with your health care provider. Document Revised: 04/28/2017 Document Reviewed: 03/17/2016 Elsevier Patient Education  2020 [employer]  

## 2020-09-16 NOTE — Assessment & Plan Note (Signed)
Encouraged heart healthy diet, increase exercise, avoid trans fats, consider a krill oil cap daily 

## 2020-09-16 NOTE — Assessment & Plan Note (Signed)
hgba1c acceptable, minimize simple carbs. Increase exercise as tolerated. Continue current meds 

## 2020-09-16 NOTE — Assessment & Plan Note (Signed)
Well controlled, no changes to meds. Encouraged heart healthy diet such as the DASH diet and exercise as tolerated.  °

## 2020-09-18 NOTE — Assessment & Plan Note (Signed)
Some hip pain, Encouraged moist heat and gentle stretching as tolerated. May try NSAIDs and prescription meds as directed and report if symptoms worsen or seek immediate care and can try Tizanidine 1-4 mg bid prn. Prescription given

## 2020-09-18 NOTE — Assessment & Plan Note (Signed)
Encouraged moist heat and gentle stretching as tolerated. May try NSAIDs and prescription meds as directed and report if symptoms worsen or seek immediate care 

## 2020-09-18 NOTE — Progress Notes (Signed)
Subjective:    Patient ID: Heather Roberts, female    DOB: April 25, 1946, 74 y.o.   MRN: [id number]  Chief Complaint  Patient presents with  . Follow-up    HPI Patient is in today for follow up on chronic medical concerns. No recent febrile illness or hospitalizations. She has been mostly feeling well with with increased activity she has had some increase in back and hip pain. She is trying to eat well and maintain a heart healthy diet. Denies CP/palp/SOB/HA/congestion/fevers/GI or GU c/o. Taking meds as prescribed  Past Medical History:  Diagnosis Date  . Allergic rhinitis   . Anemia 03/19/2013  . Anxiety   . Bouchard nodes (DJD hand)   . Carotid artery disease ([place]) 03/24/2014  . Cervical cancer screening 07/31/2015  . Dense breast tissue 03/03/2017  . Diabetes mellitus type II   . DJD (degenerative joint disease), lumbar   . Fibroids   . Glaucoma   . H/O diverticulitis of colon 03/03/2017  . History of DVT (deep vein thrombosis) 1994  . Hyperlipidemia   . Hypertension   . Insomnia 02/03/2016  . Nocturia 12/18/2012  . Obesity 07/31/2015  . Palpitations 12/18/2012    Past Surgical History:  Procedure Laterality Date  . ABDOMINAL HYSTERECTOMY    . CHOLECYSTECTOMY    . COLONOSCOPY     approx 8 years ago  . COLONOSCOPY  07/01/2020  . EYE SURGERY  09/2014, 08/2014   Cataracts removal  . KNEE ARTHROSCOPY Bilateral    Left 2007, Right 1999    Family History  Problem Relation Age of Onset  . Stroke Sister   . Diabetes Mother   . Kidney disease Mother   . COPD Father        smoker  . Stroke Sister   . Breast cancer Sister        s/p mastectomy  . COPD Brother   . Stroke Brother   . Stroke Other   . Diabetes Paternal Grandmother   . Colon cancer Neg Hx   . Liver cancer Neg Hx     Social History   Socioeconomic History  . Marital status: Married    Spouse name: Essick  . Number of children: 3  . Years of education: Not on file  . Highest education level:  Not on file  Occupational History  . Occupation: Scientist, research (physical sciences): RETIRED    Comment: used to work for [employer]  . Smoking status: Former Smoker    Packs/day: 0.30    Years: 8.00    Pack years: 2.40    Types: Cigarettes  . Smokeless tobacco: Never Used  Vaping Use  . Vaping Use: Never used  Substance and Sexual Activity  . Alcohol use: Yes    Alcohol/week: 0.0 standard drinks    Comment: Rarely---glass of wine with a meeting  . Drug use: No  . Sexual activity: Yes    Partners: Male    Comment: lives with husband, no dietary restrictions, minimizes dairy is walking more now  Other Topics Concern  . 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 Difficulty of Paying Living Expenses:  Not on file  Food Insecurity:   . Worried About Charity fundraiser in the Last Year: Not on file  . Ran Out of Food in the Last Year: Not on file  Transportation Needs:   . Lack of Transportation (Medical): Not on file  . Lack of Transportation (Non-Medical): Not on file  Physical Activity:   . Days of Exercise per Week: Not on file  . Minutes of Exercise per Session: Not on file  Stress:   . Feeling of Stress : Not on file  Social Connections:   . Frequency of Communication with Friends and Family: Not on file  . Frequency of Social Gatherings with Friends and Family: Not on file  . Attends Religious Services: Not on file  . Active Member of Clubs or Organizations: Not on file  . Attends Archivist Meetings: Not on file  . Marital Status: Not on file  Intimate Partner Violence:   . Fear of Current or Ex-Partner: Not on file  . Emotionally Abused: Not on file  . Physically Abused: Not on file  . Sexually Abused: Not on file    Outpatient  Medications Prior to Visit  Medication Sig Dispense Refill  . aspirin 81 MG tablet Take 81 mg by mouth daily.      . Cyanocobalamin (B-12) 1000 MCG CAPS Take 1 tablet by mouth as needed.     . fluticasone (FLONASE) 50 MCG/ACT nasal spray Place 2 sprays into both nostrils daily as needed for allergies or rhinitis. 16 g 6  . glucose blood (GLUCOSE METER TEST) test strip To use with onetouch verio flex DX E11.9 100 each 6  . glucose blood (ONETOUCH VERIO) test strip Check blood sugar once daily.  DX E11.9 100 each 6  . hydrochlorothiazide (HYDRODIURIL) 12.5 MG tablet Take 1 tablet (12.5 mg total) by mouth daily. 30 tablet 5  . hyoscyamine (LEVSIN SL) 0.125 MG SL tablet Place 1 tablet (0.125 mg total) under the tongue every 4 (four) hours as needed (as needed for bloating, abdominal pain). 30 tablet 3  . lidocaine (LIDODERM) 5 % Place 1 patch onto the skin daily. Remove & Discard patch within 12 hours or as directed by MD 30 patch 0  . losartan (COZAAR) 100 MG tablet Take 1 tablet (100 mg total) by mouth daily. 90 tablet 1  . mometasone (NASONEX) 50 MCG/ACT nasal spray Place 2 sprays into the nose in the morning.     . naproxen (NAPROSYN) 375 MG tablet Take 1 tablet (375 mg total) by mouth 2 (two) times daily with a meal. 14 tablet 0  . nitroGLYCERIN (NITROSTAT) 0.4 MG SL tablet Place 1 tablet (0.4 mg total) under the tongue every 5 (five) minutes as needed for chest pain. 25 tablet 1  . amLODipine (NORVASC) 10 MG tablet Take 1 tablet (10 mg total) by mouth daily. 30 tablet 5  . metFORMIN (GLUCOPHAGE-XR) 500 MG 24 hr tablet Take 1 tablet (500 mg total) by mouth 3 (three) times daily with meals. 270 tablet 1  . rosuvastatin (CRESTOR) 20 MG tablet TAKE 1 TABLET BY MOUTH EVERYDAY AT BEDTIME 90 tablet 1  . tiZANidine (ZANAFLEX) 2 MG tablet Take 0.5-2 tablets (1-4 mg total) by mouth every 8 (eight) hours as needed for muscle spasms. 40 tablet 1  . omeprazole (PRILOSEC) 20 MG capsule Take 1 capsule (20 mg  total) by mouth 2 (two) times daily before a meal. 84 capsule 1   No facility-administered medications prior to  visit.    Allergies  Allergen Reactions  . Tussionex Pennkinetic Er [Hydrocod Polst-Cpm Polst Er] Other (See Comments)    Upset stomach and loose stool     Review of Systems  Constitutional: Negative for fever and malaise/fatigue.  HENT: Negative for congestion.   Eyes: Negative for blurred vision.  Respiratory: Negative for shortness of breath.   Cardiovascular: Negative for chest pain, palpitations and leg swelling.  Gastrointestinal: Negative for abdominal pain, blood in stool and nausea.  Genitourinary: Negative for dysuria and frequency.  Musculoskeletal: Positive for back pain and joint pain. Negative for falls.  Skin: Negative for rash.  Neurological: Negative for dizziness, loss of consciousness and headaches.  Endo/Heme/Allergies: Negative for environmental allergies.  Psychiatric/Behavioral: Negative for depression. The patient is not nervous/anxious.        Objective:    Physical Exam Vitals and nursing note reviewed.  Constitutional:      General: She is not in acute distress.    Appearance: She is well-developed.  HENT:     Head: Normocephalic and atraumatic.     Nose: Nose normal.  Eyes:     General:        Right eye: No discharge.        Left eye: No discharge.  Cardiovascular:     Rate and Rhythm: Normal rate and regular rhythm.     Heart sounds: No murmur heard.   Pulmonary:     Effort: Pulmonary effort is normal.     Breath sounds: Normal breath sounds.  Abdominal:     General: Bowel sounds are normal.     Palpations: Abdomen is soft.     Tenderness: There is no abdominal tenderness.  Musculoskeletal:     Cervical back: Normal range of motion and neck supple.  Skin:    General: Skin is warm and dry.  Neurological:     Mental Status: She is alert and oriented to person, place, and time.     BP 122/68   Pulse 81   Temp 97.8 F  (36.6 C) (Oral)   Resp 16   Wt 179 lb (81.2 kg)   SpO2 98%   BMI 33.82 kg/m  Wt Readings from Last 3 Encounters:  09/16/20 179 lb (81.2 kg)  08/15/20 177 lb 0.5 oz (80.3 kg)  07/01/20 177 lb (80.3 kg)    Diabetic Foot Exam - Simple   Simple Foot Form Visual Inspection No deformities, no ulcerations, no other skin breakdown bilaterally: Yes Sensation Testing Intact to touch and monofilament testing bilaterally: Yes Pulse Check Posterior Tibialis and Dorsalis pulse intact bilaterally: Yes Comments    Lab Results  Component Value Date   WBC 7.1 08/11/2020   HGB 13.6 08/11/2020   HCT 40.8 08/11/2020   PLT 260 08/11/2020   GLUCOSE 165 (H) 08/11/2020   CHOL 129 05/08/2020   TRIG 125.0 05/08/2020   HDL 61.30 05/08/2020   LDLDIRECT 118.5 10/04/2007   LDLCALC 43 05/08/2020   ALT 14 05/08/2020   AST 18 05/08/2020   NA 138 08/11/2020   K 4.2 08/11/2020   CL 101 08/11/2020   CREATININE 0.99 (H) 08/11/2020   BUN 14 08/11/2020   CO2 26 08/11/2020   TSH 1.12 05/08/2020   HGBA1C 7.8 (H) 08/11/2020   MICROALBUR 0.50 06/14/2012    Lab Results  Component Value Date   TSH 1.12 05/08/2020   Lab Results  Component Value Date   WBC 7.1 08/11/2020   HGB 13.6 08/11/2020   HCT 40.8  08/11/2020   MCV 91.9 08/11/2020   PLT 260 08/11/2020   Lab Results  Component Value Date   NA 138 08/11/2020   K 4.2 08/11/2020   CO2 26 08/11/2020   GLUCOSE 165 (H) 08/11/2020   BUN 14 08/11/2020   CREATININE 0.99 (H) 08/11/2020   BILITOT 0.8 05/08/2020   ALKPHOS 101 05/08/2020   AST 18 05/08/2020   ALT 14 05/08/2020   PROT 7.3 05/08/2020   ALBUMIN 4.1 05/08/2020   CALCIUM 9.9 08/11/2020   GFR 67.15 05/08/2020   Lab Results  Component Value Date   CHOL 129 05/08/2020   Lab Results  Component Value Date   HDL 61.30 05/08/2020   Lab Results  Component Value Date   LDLCALC 43 05/08/2020   Lab Results  Component Value Date   TRIG 125.0 05/08/2020   Lab Results  Component  Value Date   CHOLHDL 2 05/08/2020   Lab Results  Component Value Date   HGBA1C 7.8 (H) 08/11/2020       Assessment & Plan:   Problem List Items Addressed This Visit    Diabetes mellitus type 2 in obese ([place])    hgba1c acceptable, minimize simple carbs. Increase exercise as tolerated. Continue current meds      Relevant Medications   metFORMIN (GLUCOPHAGE-XR) 500 MG 24 hr tablet   rosuvastatin (CRESTOR) 20 MG tablet   Other Relevant Orders   Hemoglobin A1c   Hyperlipidemia associated with type 2 diabetes mellitus ([place])    Encouraged heart healthy diet, increase exercise, avoid trans fats, consider a krill oil cap daily      Relevant Medications   metFORMIN (GLUCOPHAGE-XR) 500 MG 24 hr tablet   rosuvastatin (CRESTOR) 20 MG tablet   Other Relevant Orders   Lipid panel   Essential hypertension    Well controlled, no changes to meds. Encouraged heart healthy diet such as the DASH diet and exercise as tolerated.       Relevant Medications   amLODipine (NORVASC) 10 MG tablet   rosuvastatin (CRESTOR) 20 MG tablet   Other Relevant Orders   CBC   Comprehensive metabolic panel   TSH   Low back pain    Encouraged moist heat and gentle stretching as tolerated. May try NSAIDs and prescription meds as directed and report if symptoms worsen or seek immediate care      Relevant Medications   tiZANidine (ZANAFLEX) 2 MG tablet   Joint pain    Some hip pain, Encouraged moist heat and gentle stretching as tolerated. May try NSAIDs and prescription meds as directed and report if symptoms worsen or seek immediate care and can try Tizanidine 1-4 mg bid prn. Prescription given      Anemia    Increase leafy greens, consider increased lean red meat and using cast iron cookware. Continue to monitor, report any concerns         I am having Council Mechanic. Garfield Cornea maintain her aspirin, fluticasone, B-12, glucose blood, glucose blood, mometasone, hydrochlorothiazide, losartan,  omeprazole, hyoscyamine, nitroGLYCERIN, naproxen, lidocaine, metFORMIN, amLODipine, rosuvastatin, and tiZANidine.  Meds ordered this encounter  Medications  . metFORMIN (GLUCOPHAGE-XR) 500 MG 24 hr tablet    Sig: Take 1 tablet (500 mg total) by mouth 3 (three) times daily with meals.    Dispense:  270 tablet    Refill:  1    Increase in dose.  Patient was advised to take what she has tid and she will pick this up when she runs out.  Marland Kitchen  amLODipine (NORVASC) 10 MG tablet    Sig: Take 1 tablet (10 mg total) by mouth daily.    Dispense:  30 tablet    Refill:  5  . rosuvastatin (CRESTOR) 20 MG tablet    Sig: TAKE 1 TABLET BY MOUTH EVERYDAY AT BEDTIME    Dispense:  90 tablet    Refill:  1  . tiZANidine (ZANAFLEX) 2 MG tablet    Sig: Take 0.5-2 tablets (1-4 mg total) by mouth every 8 (eight) hours as needed for muscle spasms.    Dispense:  40 tablet    Refill:  1     [name], MD

## 2020-11-12 ENCOUNTER — Other Ambulatory Visit: Payer: Self-pay | Admitting: Family Medicine

## 2020-11-12 DIAGNOSIS — Z1231 Encounter for screening mammogram for malignant neoplasm of breast: Secondary | ICD-10-CM

## 2020-12-26 ENCOUNTER — Ambulatory Visit: Payer: Medicare Other

## 2020-12-30 ENCOUNTER — Encounter: Payer: Self-pay | Admitting: Family Medicine

## 2020-12-30 ENCOUNTER — Encounter (HOSPITAL_BASED_OUTPATIENT_CLINIC_OR_DEPARTMENT_OTHER): Payer: Self-pay

## 2020-12-30 ENCOUNTER — Other Ambulatory Visit: Payer: Self-pay

## 2020-12-30 ENCOUNTER — Ambulatory Visit (HOSPITAL_BASED_OUTPATIENT_CLINIC_OR_DEPARTMENT_OTHER)
Admission: RE | Admit: 2020-12-30 | Discharge: 2020-12-30 | Disposition: A | Discharge status: Home / Self Care | Payer: Medicare Other | Source: Ambulatory Visit | Attending: Family Medicine | Admitting: Family Medicine

## 2020-12-30 ENCOUNTER — Ambulatory Visit (INDEPENDENT_AMBULATORY_CARE_PROVIDER_SITE_OTHER): Payer: Medicare Other | Admitting: Family Medicine

## 2020-12-30 VITALS — BP 130/68 | HR 82 | Temp 97.9°F | Resp 16 | Wt 174.2 lb

## 2020-12-30 DIAGNOSIS — M545 Low back pain, unspecified: Secondary | ICD-10-CM | POA: Diagnosis not present

## 2020-12-30 DIAGNOSIS — M25519 Pain in unspecified shoulder: Secondary | ICD-10-CM

## 2020-12-30 DIAGNOSIS — G8929 Other chronic pain: Secondary | ICD-10-CM | POA: Diagnosis not present

## 2020-12-30 DIAGNOSIS — M25561 Pain in right knee: Secondary | ICD-10-CM | POA: Diagnosis not present

## 2020-12-30 DIAGNOSIS — E785 Hyperlipidemia, unspecified: Secondary | ICD-10-CM

## 2020-12-30 DIAGNOSIS — Z1239 Encounter for other screening for malignant neoplasm of breast: Secondary | ICD-10-CM

## 2020-12-30 DIAGNOSIS — Z1231 Encounter for screening mammogram for malignant neoplasm of breast: Secondary | ICD-10-CM | POA: Diagnosis not present

## 2020-12-30 DIAGNOSIS — K5900 Constipation, unspecified: Secondary | ICD-10-CM | POA: Diagnosis not present

## 2020-12-30 DIAGNOSIS — E669 Obesity, unspecified: Secondary | ICD-10-CM | POA: Diagnosis not present

## 2020-12-30 DIAGNOSIS — I10 Essential (primary) hypertension: Secondary | ICD-10-CM | POA: Diagnosis not present

## 2020-12-30 DIAGNOSIS — E1169 Type 2 diabetes mellitus with other specified complication: Secondary | ICD-10-CM

## 2020-12-30 LAB — COMPREHENSIVE METABOLIC PANEL
ALT: 12 U/L (ref 0–35)
AST: 18 U/L (ref 0–37)
Albumin: 4.1 g/dL (ref 3.5–5.2)
Alkaline Phosphatase: 88 U/L (ref 39–117)
BUN: 14 mg/dL (ref 6–23)
CO2: 28 mEq/L (ref 19–32)
Calcium: 9.7 mg/dL (ref 8.4–10.5)
Chloride: 100 mEq/L (ref 96–112)
Creatinine, Ser: 1.01 mg/dL (ref 0.40–1.20)
GFR: 54.83 mL/min — ABNORMAL LOW (ref >60.00)
Glucose, Bld: 135 mg/dL — ABNORMAL HIGH (ref 70–99)
Potassium: 4.8 mEq/L (ref 3.5–5.1)
Sodium: 136 mEq/L (ref 135–145)
Total Bilirubin: 1 mg/dL (ref 0.2–1.2)
Total Protein: 7.3 g/dL (ref 6.0–8.3)

## 2020-12-30 LAB — CBC
HCT: 38.2 % (ref 36.0–46.0)
Hemoglobin: 12.6 g/dL (ref 12.0–15.0)
MCHC: 32.9 g/dL (ref 30.0–36.0)
MCV: 91.8 fl (ref 78.0–100.0)
Platelets: 228 10*3/uL (ref 150.0–400.0)
RBC: 4.16 Mil/uL (ref 3.87–5.11)
RDW: 14.2 % (ref 11.5–15.5)
WBC: 6 10*3/uL (ref 4.0–10.5)

## 2020-12-30 LAB — LIPID PANEL
Cholesterol: 117 mg/dL (ref 0–200)
HDL: 59.8 mg/dL (ref >39.00)
LDL Cholesterol: 41 mg/dL (ref 0–99)
NonHDL: 57.51
Total CHOL/HDL Ratio: 2
Triglycerides: 84 mg/dL (ref 0.0–149.0)
VLDL: 16.8 mg/dL (ref 0.0–40.0)

## 2020-12-30 LAB — TSH: TSH: 1.04 u[IU]/mL (ref 0.35–4.50)

## 2020-12-30 LAB — HEMOGLOBIN A1C: Hgb A1c MFr Bld: 7.9 % — ABNORMAL HIGH (ref 4.6–6.5)

## 2020-12-30 NOTE — Assessment & Plan Note (Signed)
Doing better but has some bloating after eating, hyoscyamine is helping

## 2020-12-30 NOTE — Progress Notes (Signed)
Patient ID: Heather Roberts, female    DOB: [birth year]  Age: 75 y.o. MRN: [id number]    Subjective:  Subjective  HPI Heather Roberts presents for office visit today. She complains of having lower middle back, shoulders, and knee pain after painting. Her right knee is more sore than her left due to sitting on her knees during painting. She mentions putting heat in area and a cold wrap, but denies icing the area. She states that her grandson is moving in soon due to increase in rental cost. She states that the pain could be related to her recent activities.   She endorses having bloating after eating and with her grandson moving in it has worsened. She has been feeling "sluggish" towards the end of the night. She states that naps aren't helping her to gain energy. She also notes that she has loss 3 lbs. She denies any chest pain, SOB, fever, chills, sore throat, dysuria, HA, or cough, feet discoloration or loss of sensation at this time.  Review of Systems  Constitutional: Positive for fatigue and unexpected weight change (loss 3 lbs). Negative for chills and fever.  HENT: Negative for congestion and ear pain.   Eyes: Negative for pain.  Respiratory: Negative for cough and shortness of breath.   Cardiovascular: Negative for chest pain, palpitations and leg swelling.  Gastrointestinal: Negative for abdominal pain, blood in stool, diarrhea, nausea and vomiting.       (+)bloating  Genitourinary: Negative for dysuria and urgency.  Musculoskeletal: Positive for back pain. Negative for myalgias.       (+)Bilateral shoulders pain (+)Bilateral knee pain (R>L)  Skin: Negative for rash.  Allergic/Immunologic: Negative for environmental allergies.  Neurological: Negative for dizziness and headaches.    History Past Medical History:  Diagnosis Date  . Allergic rhinitis   . Anemia 03/19/2013  . Anxiety   . Bouchard nodes (DJD hand)   . Carotid artery disease ([place]) 03/24/2014  .  Cervical cancer screening 07/31/2015  . Dense breast tissue 03/03/2017  . Diabetes mellitus type II   . DJD (degenerative joint disease), lumbar   . Fibroids   . Glaucoma   . H/O diverticulitis of colon 03/03/2017  . History of DVT (deep vein thrombosis) 1994  . Hyperlipidemia   . Hypertension   . Insomnia 02/03/2016  . Nocturia 12/18/2012  . Obesity 07/31/2015  . Palpitations 12/18/2012    She has a past surgical history that includes Knee arthroscopy (Bilateral); Cholecystectomy; Abdominal hysterectomy; Eye surgery (09/2014, 08/2014); Colonoscopy; Colonoscopy (07/01/2020); and Breast cyst aspiration (Right).   Her family history includes Breast cancer in her sister; COPD in her brother and father; Colon polyps in her son; Diabetes in her mother and paternal grandmother; Kidney disease in her mother; Stroke in her brother, sister, sister, and another family member.She reports that she has quit smoking. Her smoking use included cigarettes. She has a 2.40 pack-year smoking history. She has never used smokeless tobacco. She reports current alcohol use. She reports that she does not use drugs.  Current Outpatient Medications on File Prior to Visit  Medication Sig Dispense Refill  . amLODipine (NORVASC) 10 MG tablet Take 1 tablet (10 mg total) by mouth daily. 30 tablet 5  . aspirin 81 MG tablet Take 81 mg by mouth daily.      . Cyanocobalamin (B-12) 1000 MCG CAPS Take 1 tablet by mouth as needed.     . fluticasone (FLONASE) 50 MCG/ACT nasal spray Place 2 sprays into  both nostrils daily as needed for allergies or rhinitis. 16 g 6  . glucose blood (GLUCOSE METER TEST) test strip To use with onetouch verio flex DX E11.9 100 each 6  . glucose blood (ONETOUCH VERIO) test strip Check blood sugar once daily.  DX E11.9 100 each 6  . hydrochlorothiazide (HYDRODIURIL) 12.5 MG tablet Take 1 tablet (12.5 mg total) by mouth daily. 30 tablet 5  . hyoscyamine (LEVSIN SL) 0.125 MG SL tablet Place 1 tablet (0.125  mg total) under the tongue every 4 (four) hours as needed (as needed for bloating, abdominal pain). 30 tablet 3  . lidocaine (LIDODERM) 5 % Place 1 patch onto the skin daily. Remove & Discard patch within 12 hours or as directed by MD 30 patch 0  . losartan (COZAAR) 100 MG tablet Take 1 tablet (100 mg total) by mouth daily. 90 tablet 1  . metFORMIN (GLUCOPHAGE-XR) 500 MG 24 hr tablet Take 1 tablet (500 mg total) by mouth 3 (three) times daily with meals. 270 tablet 1  . mometasone (NASONEX) 50 MCG/ACT nasal spray Place 2 sprays into the nose in the morning.     . naproxen (NAPROSYN) 375 MG tablet Take 1 tablet (375 mg total) by mouth 2 (two) times daily with a meal. 14 tablet 0  . nitroGLYCERIN (NITROSTAT) 0.4 MG SL tablet Place 1 tablet (0.4 mg total) under the tongue every 5 (five) minutes as needed for chest pain. 25 tablet 1  . omeprazole (PRILOSEC) 20 MG capsule Take 1 capsule (20 mg total) by mouth 2 (two) times daily before a meal. 84 capsule 1  . rosuvastatin (CRESTOR) 20 MG tablet TAKE 1 TABLET BY MOUTH EVERYDAY AT BEDTIME 90 tablet 1  . tiZANidine (ZANAFLEX) 2 MG tablet Take 0.5-2 tablets (1-4 mg total) by mouth every 8 (eight) hours as needed for muscle spasms. 40 tablet 1   No current facility-administered medications on file prior to visit.     Objective:  Objective  Physical Exam Vitals and nursing note reviewed.  Constitutional:      General: She is not in acute distress.    Appearance: Normal appearance. She is well-developed. She is not ill-appearing.  HENT:     Head: Normocephalic and atraumatic.     Right Ear: External ear normal.     Left Ear: External ear normal.     Nose: Nose normal.  Eyes:     Extraocular Movements: Extraocular movements intact.     Pupils: Pupils are equal, round, and reactive to light.  Cardiovascular:     Rate and Rhythm: Normal rate and regular rhythm.     Pulses: Normal pulses.     Heart sounds: Normal heart sounds. No murmur  heard.   Pulmonary:     Effort: Pulmonary effort is normal.     Breath sounds: Normal breath sounds.  Abdominal:     General: Bowel sounds are normal.     Palpations: Abdomen is soft.     Tenderness: There is no abdominal tenderness.  Musculoskeletal:     Cervical back: Normal range of motion and neck supple.     Right foot: No deformity or foot drop.     Left foot: No deformity or foot drop.  Feet:     Right foot:     Skin integrity: Skin integrity normal. No blister, skin breakdown, warmth or dry skin.     Left foot:     Skin integrity: Skin integrity normal. No blister, skin breakdown, warmth or  dry skin.     Comments: No lesions, good pulses, no dryness between the toes bilaterally Skin:    General: Skin is warm and dry.  Neurological:     Mental Status: She is alert and oriented to person, place, and time.  Psychiatric:        Behavior: Behavior normal.    BP 130/68   Pulse 82   Temp 97.9 F (36.6 C)   Resp 16   Wt 174 lb 3.2 oz (79 kg)   SpO2 96%   BMI 32.91 kg/m  Wt Readings from Last 3 Encounters:  12/30/20 174 lb 3.2 oz (79 kg)  09/16/20 179 lb (81.2 kg)  08/15/20 177 lb 0.5 oz (80.3 kg)     Lab Results  Component Value Date   WBC 6.0 12/30/2020   HGB 12.6 12/30/2020   HCT 38.2 12/30/2020   PLT 228.0 12/30/2020   GLUCOSE 135 (H) 12/30/2020   CHOL 117 12/30/2020   TRIG 84.0 12/30/2020   HDL 59.80 12/30/2020   LDLDIRECT 118.5 10/04/2007   LDLCALC 41 12/30/2020   ALT 12 12/30/2020   AST 18 12/30/2020   NA 136 12/30/2020   K 4.8 12/30/2020   CL 100 12/30/2020   CREATININE 1.01 12/30/2020   BUN 14 12/30/2020   CO2 28 12/30/2020   TSH 1.04 12/30/2020   HGBA1C 7.9 (H) 12/30/2020   MICROALBUR 0.50 06/14/2012    No results found.   Assessment & Plan:  Plan    No orders of the defined types were placed in this encounter.   Problem List Items Addressed This Visit    Diabetes mellitus type 2 in obese ([place])    hgba1c acceptable, minimize  simple carbs. Increase exercise as tolerated. Continue current meds      Relevant Orders   Hemoglobin A1c (Completed)   Hyperlipidemia associated with type 2 diabetes mellitus ([place])    Encouraged heart healthy diet, increase exercise, avoid trans fats, consider a krill oil cap daily      Relevant Orders   Lipid panel (Completed)   Essential hypertension    Well controlled, no changes to meds. Encouraged heart healthy diet such as the DASH diet and exercise as tolerated.       Relevant Orders   CBC (Completed)   Comprehensive metabolic panel (Completed)   TSH (Completed)   Constipation    Doing better but has some bloating after eating, hyoscyamine is helping      Shoulder pain    Encouraged moist heat and gentle stretching as tolerated. May try NSAIDs and prescription meds as directed and report if symptoms worsen or seek immediate care. From painting in preparation of her grandson moving in. She will call if pain persists and she wants referral      Low back pain    Encouraged moist heat and gentle stretching as tolerated. May try NSAIDs and prescription meds as directed and report if symptoms worsen or seek immediate care      Right knee pain    No swelling or redness. Sore since being on knees painting. She will call if she wants a referral. Try icing and monitor       Other Visit Diagnoses    Breast cancer screening, high risk patient    -  Primary   Relevant Orders   MM 3D SCREEN BREAST BILATERAL (Completed)   Encounter for screening for malignant neoplasm of breast, unspecified screening modality       Relevant Orders  MM 3D SCREEN BREAST BILATERAL (Completed)      Follow-up: Return in about 6 months (around 07/02/2021).   I,Alexis Bryant,acting as a Education administrator for [name], MD.,have documented all relevant documentation on the behalf of [name], MD,as directed by  [name], MD while in the presence of [name], MD.  Medical screening  examination/treatment was performed by qualified clinical staff member and as supervising physician I was immediately available for consultation/collaboration. I have reviewed documentation and agree with assessment and plan.  [name], MD

## 2020-12-30 NOTE — Assessment & Plan Note (Signed)
No swelling or redness. Sore since being on knees painting. She will call if she wants a referral. Try icing and monitor

## 2020-12-30 NOTE — Assessment & Plan Note (Signed)
Well controlled, no changes to meds. Encouraged heart healthy diet such as the DASH diet and exercise as tolerated.  °

## 2020-12-30 NOTE — Assessment & Plan Note (Signed)
Encouraged heart healthy diet, increase exercise, avoid trans fats, consider a krill oil cap daily 

## 2020-12-30 NOTE — Assessment & Plan Note (Signed)
hgba1c acceptable, minimize simple carbs. Increase exercise as tolerated. Continue current meds 

## 2020-12-30 NOTE — Assessment & Plan Note (Addendum)
Encouraged moist heat and gentle stretching as tolerated. May try NSAIDs and prescription meds as directed and report if symptoms worsen or seek immediate care. From painting in preparation of her grandson moving in. She will call if pain persists and she wants referral

## 2020-12-30 NOTE — Patient Instructions (Signed)

## 2020-12-31 NOTE — Assessment & Plan Note (Signed)
Encouraged moist heat and gentle stretching as tolerated. May try NSAIDs and prescription meds as directed and report if symptoms worsen or seek immediate care 

## 2021-01-14 ENCOUNTER — Encounter: Payer: Self-pay | Admitting: *Deleted

## 2021-01-14 DIAGNOSIS — H35369 Drusen (degenerative) of macula, unspecified eye: Secondary | ICD-10-CM | POA: Diagnosis not present

## 2021-01-14 DIAGNOSIS — H1013 Acute atopic conjunctivitis, bilateral: Secondary | ICD-10-CM | POA: Diagnosis not present

## 2021-01-14 DIAGNOSIS — H26493 Other secondary cataract, bilateral: Secondary | ICD-10-CM | POA: Diagnosis not present

## 2021-01-14 DIAGNOSIS — E119 Type 2 diabetes mellitus without complications: Secondary | ICD-10-CM | POA: Diagnosis not present

## 2021-01-14 LAB — HM DIABETES EYE EXAM

## 2021-03-29 ENCOUNTER — Other Ambulatory Visit: Payer: Self-pay | Admitting: Family Medicine

## 2021-03-29 DIAGNOSIS — I10 Essential (primary) hypertension: Secondary | ICD-10-CM

## 2021-04-21 ENCOUNTER — Encounter: Payer: Self-pay | Admitting: Family Medicine

## 2021-04-21 ENCOUNTER — Other Ambulatory Visit: Payer: Self-pay

## 2021-04-21 ENCOUNTER — Ambulatory Visit (INDEPENDENT_AMBULATORY_CARE_PROVIDER_SITE_OTHER): Payer: Medicare Other | Admitting: Family Medicine

## 2021-04-21 VITALS — BP 130/78 | HR 78 | Temp 98.1°F | Resp 18 | Ht 61.0 in | Wt 178.4 lb

## 2021-04-21 DIAGNOSIS — K5901 Slow transit constipation: Secondary | ICD-10-CM

## 2021-04-21 DIAGNOSIS — R10816 Epigastric abdominal tenderness: Secondary | ICD-10-CM

## 2021-04-21 DIAGNOSIS — K2970 Gastritis, unspecified, without bleeding: Secondary | ICD-10-CM | POA: Diagnosis not present

## 2021-04-21 DIAGNOSIS — K2990 Gastroduodenitis, unspecified, without bleeding: Secondary | ICD-10-CM | POA: Diagnosis not present

## 2021-04-21 LAB — COMPREHENSIVE METABOLIC PANEL
ALT: 11 U/L (ref 0–35)
AST: 17 U/L (ref 0–37)
Albumin: 4.3 g/dL (ref 3.5–5.2)
Alkaline Phosphatase: 91 U/L (ref 39–117)
BUN: 23 mg/dL (ref 6–23)
CO2: 25 mEq/L (ref 19–32)
Calcium: 9.7 mg/dL (ref 8.4–10.5)
Chloride: 102 mEq/L (ref 96–112)
Creatinine, Ser: 1.03 mg/dL (ref 0.40–1.20)
GFR: 53.44 mL/min — ABNORMAL LOW (ref >60.00)
Glucose, Bld: 146 mg/dL — ABNORMAL HIGH (ref 70–99)
Potassium: 4.4 mEq/L (ref 3.5–5.1)
Sodium: 137 mEq/L (ref 135–145)
Total Bilirubin: 0.7 mg/dL (ref 0.2–1.2)
Total Protein: 7.3 g/dL (ref 6.0–8.3)

## 2021-04-21 LAB — CBC WITH DIFFERENTIAL/PLATELET
Basophils Absolute: 0 10*3/uL (ref 0.0–0.1)
Basophils Relative: 0.6 % (ref 0.0–3.0)
Eosinophils Absolute: 0.2 10*3/uL (ref 0.0–0.7)
Eosinophils Relative: 3.1 % (ref 0.0–5.0)
HCT: 36.7 % (ref 36.0–46.0)
Hemoglobin: 12 g/dL (ref 12.0–15.0)
Lymphocytes Relative: 51.2 % — ABNORMAL HIGH (ref 12.0–46.0)
Lymphs Abs: 3.4 10*3/uL (ref 0.7–4.0)
MCHC: 32.7 g/dL (ref 30.0–36.0)
MCV: 92.8 fl (ref 78.0–100.0)
Monocytes Absolute: 0.6 10*3/uL (ref 0.1–1.0)
Monocytes Relative: 9.2 % (ref 3.0–12.0)
Neutro Abs: 2.4 10*3/uL (ref 1.4–7.7)
Neutrophils Relative %: 35.9 % — ABNORMAL LOW (ref 43.0–77.0)
Platelets: 241 10*3/uL (ref 150.0–400.0)
RBC: 3.95 Mil/uL (ref 3.87–5.11)
RDW: 14.2 % (ref 11.5–15.5)
WBC: 6.7 10*3/uL (ref 4.0–10.5)

## 2021-04-21 LAB — AMYLASE: Amylase: 38 U/L (ref 27–131)

## 2021-04-21 LAB — LIPASE: Lipase: 19 U/L (ref 11.0–59.0)

## 2021-04-21 LAB — H. PYLORI ANTIBODY, IGG: H Pylori IgG: NEGATIVE

## 2021-04-21 LAB — TSH: TSH: 1.29 u[IU]/mL (ref 0.35–5.50)

## 2021-04-21 MED ORDER — OMEPRAZOLE 20 MG PO CPDR: 20.0000 mg | DELAYED_RELEASE_CAPSULE | Freq: Two times a day (BID) | ORAL | 1 refills | Status: AC

## 2021-04-21 NOTE — Patient Instructions (Signed)
Food Choices for Gastroesophageal Reflux Disease, Adult °When you have gastroesophageal reflux disease (GERD), the foods you eat and your eating habits are very important. Choosing the right foods can help ease the discomfort of GERD. Consider working with a dietitian to help you make healthy food choices. °What are tips for following this plan? °Reading food labels °Look for foods that are low in saturated fat. Foods that have less than 5% of daily value (DV) of fat and 0 g of trans fats may help with your symptoms. °Cooking °Cook foods using methods other than frying. This may include baking, steaming, grilling, or broiling. These are all methods that do not need a lot of fat for cooking. °To add flavor, try to use herbs that are low in spice and acidity. °Meal planning ° °Choose healthy foods that are low in fat, such as fruits, vegetables, whole grains, low-fat dairy products, lean meats, fish, and poultry. °Eat frequent, small meals instead of three large meals each day. Eat your meals slowly, in a relaxed setting. Avoid bending over or lying down until 2-3 hours after eating. °Limit high-fat foods such as fatty meats or fried foods. °Limit your intake of fatty foods, such as oils, butter, and shortening. °Avoid the following as told by your health care provider: °Foods that cause symptoms. These may be different for different people. Keep a food diary to keep track of foods that cause symptoms. °Alcohol. °Drinking large amounts of liquid with meals. °Eating meals during the 2-3 hours before bed. °Lifestyle °Maintain a healthy weight. Ask your health care provider what weight is healthy for you. If you need to lose weight, work with your health care provider to do so safely. °Exercise for at least 30 minutes on 5 or more days each week, or as told by your health care provider. °Avoid wearing clothes that fit tightly around your waist and chest. °Do not use any products that contain nicotine or tobacco. These  products include cigarettes, chewing tobacco, and vaping devices, such as e-cigarettes. If you need help quitting, ask your health care provider. °Sleep with the head of your bed raised. Use a wedge under the mattress or blocks under the bed frame to raise the head of the bed. °Chew sugar-free gum after mealtimes. °What foods should I eat? °Eat a healthy, well-balanced diet of fruits, vegetables, whole grains, low-fat dairy products, lean meats, fish, and poultry. Each person is different. Foods that may trigger symptoms in one person may not trigger any symptoms in another person. Work with your health care provider to identify foods that are safe for you. °The items listed above may not be a complete list of recommended foods and beverages. Contact a dietitian for more information. °What foods should I avoid? °Limiting some of these foods may help manage the symptoms of GERD. Everyone is different. Consult a dietitian or your health care provider to help you identify the exact foods to avoid, if any. °Fruits °Any fruits prepared with added fat. Any fruits that cause symptoms. For some people this may include citrus fruits, such as oranges, grapefruit, pineapple, and lemons. °Vegetables °Deep-fried vegetables. French fries. Any vegetables prepared with added fat. Any vegetables that cause symptoms. For some people, this may include tomatoes and tomato products, chili peppers, onions and garlic, and horseradish. °Grains °Pastries or quick breads with added fat. °Meats and other proteins °High-fat meats, such as fatty beef or pork, hot dogs, ribs, ham, sausage, salami, and bacon. Fried meat or protein, including fried   fish and fried chicken. Nuts and nut butters, in large amounts. °Dairy °Whole milk and chocolate milk. Sour cream. Cream. Ice cream. Cream cheese. Milkshakes. °Fats and oils °Butter. Margarine. Shortening. Ghee. °Beverages °Coffee and tea, with or without caffeine. Carbonated beverages. Sodas. Energy  drinks. Fruit juice made with acidic fruits, such as orange or grapefruit. Tomato juice. Alcoholic drinks. °Sweets and desserts °Chocolate and cocoa. Donuts. °Seasonings and condiments °Pepper. Peppermint and spearmint. Added salt. Any condiments, herbs, or seasonings that cause symptoms. For some people, this may include curry, hot sauce, or vinegar-based salad dressings. °The items listed above may not be a complete list of foods and beverages to avoid. Contact a dietitian for more information. °Questions to ask your health care provider °Diet and lifestyle changes are usually the first steps that are taken to manage symptoms of GERD. If diet and lifestyle changes do not improve your symptoms, talk with your health care provider about taking medicines. °Where to find more information °[employer] G[employer]intestinal [employer]: aboutgerd.org °Summary °When you have gastroesophageal reflux disease (GERD), food and lifestyle choices may be very helpful in easing the discomfort of GERD. °Eat frequent, small meals instead of three large meals each day. Eat your meals slowly, in a relaxed setting. Avoid bending over or lying down until 2-3 hours after eating. °Limit high-fat foods such as fatty meats or fried foods. °This information is not intended to replace advice given to you by your health care provider. Make sure you discuss any questions you have with your health care provider. °Document Revised: 04/14/2020 Document Reviewed: 04/14/2020 °Elsevier Patient Education © 2022 [employer] Inc. ° °

## 2021-04-21 NOTE — Progress Notes (Signed)
Established Patient Office Visit  Subjective:  Patient ID: Heather Roberts, female    DOB: Dec 07, 1945  Age: 75 y.o. MRN: [id number]  CC:  Chief Complaint  Patient presents with   Abdominal Pain    X2 weeks, Pt states having stomach pain that goes to her back. Pt states having trouble standing for long periods. Pt states no nausea/ vomiting.     HPI Heather Roberts presents for abd pain x 2 weeks --- pain shoots from front to back    no N/V/D.   She had constipation and took ducolax last wed and the pain did ease after BM.    Pain gets to 10/10 --- last episode was Sunday and yesterday     Past Medical History:  Diagnosis Date   Allergic rhinitis    Anemia 03/19/2013   Anxiety    Bouchard nodes (DJD hand)    Carotid artery disease ([place]) 03/24/2014   Cervical cancer screening 07/31/2015   Dense breast tissue 03/03/2017   Diabetes mellitus type II    DJD (degenerative joint disease), lumbar    Fibroids    Glaucoma    H/O diverticulitis of colon 03/03/2017   History of DVT (deep vein thrombosis) 1994   Hyperlipidemia    Hypertension    Insomnia 02/03/2016   Nocturia 12/18/2012   Obesity 07/31/2015   Palpitations 12/18/2012    Past Surgical History:  Procedure Laterality Date   ABDOMINAL HYSTERECTOMY     BREAST CYST ASPIRATION Right    CHOLECYSTECTOMY     COLONOSCOPY     approx 8 years ago   COLONOSCOPY  07/01/2020   EYE SURGERY  09/2014, 08/2014   Cataracts removal   KNEE ARTHROSCOPY Bilateral    Left 2007, Right 1999    Family History  Problem Relation Age of Onset   Stroke Sister    Diabetes Mother    Kidney disease Mother    COPD Father        smoker   Colon polyps Son    Stroke Sister    Breast cancer Sister        s/p mastectomy   COPD Brother    Stroke Brother    Stroke Other    Diabetes Paternal Grandmother    Colon cancer Neg Hx    Liver cancer Neg Hx     Social History   Socioeconomic History   Marital status: Married    Spouse  name: Essick   Number of children: 3   Years of education: Not on file   Highest education level: Not on file  Occupational History   Occupation: Scientist, research (physical sciences): RETIRED    Comment: used to work for [employer]  Tobacco Use   Smoking status: Former    Packs/day: 0.30    Years: 8.00    Pack years: 2.40    Types: Cigarettes   Smokeless tobacco: Never  Vaping Use   Vaping Use: Never used  Substance and Sexual Activity   Alcohol use: Yes    Alcohol/week: 0.0 standard drinks    Comment: Rarely---glass of wine with a meeting   Drug use: No   Sexual activity: Yes    Partners: Male    Comment: lives with husband, no dietary restrictions, minimizes dairy is walking more now  Other Topics Concern   Not on file  Social History Narrative   Former Smoker   Alcohol use-no    Married     3  children   daughter shama -   homemaker  - 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       Cyanocobalamin (B-12) 1000 MCG CAPS Take 1 tablet by mouth as needed.      fluticasone (FLONASE) 50 MCG/ACT nasal spray Place 2 sprays into both nostrils daily as needed for allergies or rhinitis. 16 g 6   glucose blood (GLUCOSE METER TEST) test strip To use with onetouch verio flex DX E11.9 100 each 6   glucose blood (ONETOUCH VERIO) test strip Check blood sugar once daily.  DX E11.9 100 each 6   hydrochlorothiazide (HYDRODIURIL) 12.5 MG tablet Take 1 tablet (12.5 mg total) by mouth daily. 30 tablet 5   hyoscyamine  (LEVSIN SL) 0.125 MG SL tablet Place 1 tablet (0.125 mg total) under the tongue every 4 (four) hours as needed (as needed for bloating, abdominal pain). 30 tablet 3   lidocaine (LIDODERM) 5 % Place 1 patch onto the skin daily. Remove & Discard patch within 12 hours or as directed by MD 30 patch 0   losartan (COZAAR) 100 MG tablet Take 1 tablet (100 mg total) by mouth daily. 90 tablet 1   metFORMIN (GLUCOPHAGE-XR) 500 MG 24 hr tablet Take 1 tablet (500 mg total) by mouth 3 (three) times daily with meals. 270 tablet 1   mometasone (NASONEX) 50 MCG/ACT nasal spray Place 2 sprays into the nose in the morning.      naproxen (NAPROSYN) 375 MG tablet Take 1 tablet (375 mg total) by mouth 2 (two) times daily with a meal. 14 tablet 0   nitroGLYCERIN (NITROSTAT) 0.4 MG SL tablet Place 1 tablet (0.4 mg total) under the tongue every 5 (five) minutes as needed for chest pain. 25 tablet 1   rosuvastatin (CRESTOR) 20 MG tablet TAKE 1 TABLET BY MOUTH EVERYDAY AT BEDTIME 90 tablet 1   tiZANidine (ZANAFLEX) 2 MG tablet Take 0.5-2 tablets (1-4 mg total) by mouth every 8 (eight) hours as needed for muscle spasms. 40 tablet 1   omeprazole (PRILOSEC) 20 MG capsule Take 1 capsule (20 mg total) by mouth 2 (two) times daily before a meal. 84 capsule 1   No facility-administered medications prior to visit.    Allergies  Allergen Reactions   Tussionex Pennkinetic Er [Hydrocod Polst-Cpm Polst Er] Other (See Comments)    Upset stomach and loose stool     ROS Review of Systems  Constitutional:  Negative for appetite change, diaphoresis, fatigue and unexpected weight change.  Eyes:  Negative for pain, redness and visual disturbance.  Respiratory:  Negative for cough, chest tightness, shortness of breath and wheezing.   Cardiovascular:  Negative for chest pain, palpitations and leg swelling.  Gastrointestinal:  Positive for abdominal pain and constipation. Negative for blood in stool, diarrhea, nausea, rectal pain and  vomiting.  Endocrine: Negative for cold intolerance, heat intolerance, polydipsia, polyphagia and polyuria.  Genitourinary:  Negative  for difficulty urinating, dysuria and frequency.  Neurological:  Negative for dizziness, light-headedness, numbness and headaches.     Objective:    Physical Exam Vitals and nursing note reviewed.  Constitutional:      Appearance: She is well-developed.  HENT:     Head: Normocephalic and atraumatic.  Eyes:     Conjunctiva/sclera: Conjunctivae normal.  Neck:     Thyroid: No thyromegaly.     Vascular: No carotid bruit or JVD.  Cardiovascular:     Rate and Rhythm: Normal rate and regular rhythm.     Heart sounds: Normal heart sounds. No murmur heard. Pulmonary:     Effort: Pulmonary effort is normal. No respiratory distress.     Breath sounds: Normal breath sounds. No wheezing or rales.  Chest:     Chest wall: No tenderness.  Abdominal:     General: Bowel sounds are normal. There is no distension. There are no signs of injury.     Palpations: Abdomen is soft. There is no shifting dullness.     Tenderness: There is abdominal tenderness in the epigastric area. There is no guarding or rebound. Negative signs include Murphy's sign.  Musculoskeletal:     Cervical back: Normal range of motion and neck supple.  Neurological:     Mental Status: She is alert and oriented to person, place, and time.    BP 130/78 (BP Location: Right Arm, Patient Position: Sitting, Cuff Size: Large)   Pulse 78   Temp 98.1 F (36.7 C) (Oral)   Resp 18   Ht 5\' 1"  (1.549 m)   Wt 178 lb 6.4 oz (80.9 kg)   SpO2 97%   BMI 33.71 kg/m  Wt Readings from Last 3 Encounters:  04/21/21 178 lb 6.4 oz (80.9 kg)  12/30/20 174 lb 3.2 oz (79 kg)  09/16/20 179 lb (81.2 kg)     Health Maintenance Due  Topic Date Due   Zoster Vaccines- Shingrix (1 of 2) Never done   FOOT EXAM  12/01/2016   COVID-19 Vaccine (4 - Booster for Pfizer series) 11/15/2020    There are no preventive  care reminders to display for this patient.  Lab Results  Component Value Date   TSH 1.04 12/30/2020   Lab Results  Component Value Date   WBC 6.0 12/30/2020   HGB 12.6 12/30/2020   HCT 38.2 12/30/2020   MCV 91.8 12/30/2020   PLT 228.0 12/30/2020   Lab Results  Component Value Date   NA 136 12/30/2020   K 4.8 12/30/2020   CO2 28 12/30/2020   GLUCOSE 135 (H) 12/30/2020   BUN 14 12/30/2020   CREATININE 1.01 12/30/2020   BILITOT 1.0 12/30/2020   ALKPHOS 88 12/30/2020   AST 18 12/30/2020   ALT 12 12/30/2020   PROT 7.3 12/30/2020   ALBUMIN 4.1 12/30/2020   CALCIUM 9.7 12/30/2020   GFR 54.83 (L) 12/30/2020   Lab Results  Component Value Date   CHOL 117 12/30/2020   Lab Results  Component Value Date   HDL 59.80 12/30/2020   Lab Results  Component Value Date   LDLCALC 41 12/30/2020   Lab Results  Component Value Date   TRIG 84.0 12/30/2020   Lab Results  Component Value Date   CHOLHDL 2 12/30/2020   Lab Results  Component Value Date   HGBA1C 7.9 (H) 12/30/2020      Assessment & Plan:   Problem List Items Addressed This Visit       Unprioritized  Constipation   Relevant Orders   TSH   Other Visit Diagnoses     Epigastric abdominal tenderness without rebound tenderness    -  Primary   Relevant Orders   Comprehensive metabolic panel   CBC with Differential/Platelet   H. pylori antibody, IgG   TSH   Lipase   Amylase   US Abdomen Complete   Gastritis and gastroduodenitis       Relevant Medications   omeprazole (PRILOSEC) 20 MG capsule       Meds ordered this encounter  Medications   omeprazole (PRILOSEC) 20 MG capsule    Sig: Take 1 capsule (20 mg total) by mouth 2 (two) times daily before a meal.    Dispense:  60 capsule    Refill:  1    Follow-up: Return if symptoms worsen or fail to improve.    Ann Held, DO

## 2021-04-21 NOTE — Assessment & Plan Note (Signed)
Restart prilosec daily  HO on gerd given to pt  Check labs and [place] abd  Call / rto if symptoms worsen

## 2021-04-21 NOTE — Assessment & Plan Note (Signed)
Stool softeners  Drink plenty of water  Inc fiber in diet  F/u pcp prn

## 2021-04-22 ENCOUNTER — Telehealth: Payer: Self-pay | Admitting: Family Medicine

## 2021-04-22 NOTE — Telephone Encounter (Signed)
Patient wants to know if she should keep her appointment for US Abdomen because her blood work was normal. Please advise

## 2021-04-23 ENCOUNTER — Ambulatory Visit (HOSPITAL_BASED_OUTPATIENT_CLINIC_OR_DEPARTMENT_OTHER): Payer: Medicare Other

## 2021-04-23 ENCOUNTER — Ambulatory Visit (HOSPITAL_BASED_OUTPATIENT_CLINIC_OR_DEPARTMENT_OTHER)
Admission: RE | Admit: 2021-04-23 | Discharge: 2021-04-23 | Disposition: A | Discharge status: Home / Self Care | Payer: Medicare Other | Source: Ambulatory Visit | Attending: Family Medicine | Admitting: Family Medicine

## 2021-04-23 ENCOUNTER — Other Ambulatory Visit: Payer: Self-pay

## 2021-04-23 DIAGNOSIS — R10816 Epigastric abdominal tenderness: Secondary | ICD-10-CM | POA: Insufficient documentation

## 2021-04-23 DIAGNOSIS — K760 Fatty (change of) liver, not elsewhere classified: Secondary | ICD-10-CM | POA: Diagnosis not present

## 2021-04-23 NOTE — Telephone Encounter (Signed)
Spoke with patient. Pt will call to reschedule imaging

## 2021-06-18 ENCOUNTER — Encounter: Payer: Self-pay | Admitting: Family Medicine

## 2021-06-18 ENCOUNTER — Other Ambulatory Visit: Payer: Self-pay

## 2021-06-18 ENCOUNTER — Ambulatory Visit (INDEPENDENT_AMBULATORY_CARE_PROVIDER_SITE_OTHER): Payer: Medicare Other | Admitting: Family Medicine

## 2021-06-18 VITALS — BP 132/66 | HR 82 | Temp 98.2°F | Resp 16 | Wt 178.0 lb

## 2021-06-18 DIAGNOSIS — E669 Obesity, unspecified: Secondary | ICD-10-CM

## 2021-06-18 DIAGNOSIS — G8929 Other chronic pain: Secondary | ICD-10-CM

## 2021-06-18 DIAGNOSIS — E785 Hyperlipidemia, unspecified: Secondary | ICD-10-CM | POA: Diagnosis not present

## 2021-06-18 DIAGNOSIS — E1169 Type 2 diabetes mellitus with other specified complication: Secondary | ICD-10-CM | POA: Diagnosis not present

## 2021-06-18 DIAGNOSIS — Z23 Encounter for immunization: Secondary | ICD-10-CM

## 2021-06-18 DIAGNOSIS — I10 Essential (primary) hypertension: Secondary | ICD-10-CM

## 2021-06-18 DIAGNOSIS — M545 Low back pain, unspecified: Secondary | ICD-10-CM

## 2021-06-18 NOTE — Progress Notes (Signed)
Patient ID: Heather Roberts, female    DOB: 04/05/1946  Age: 74 y.o. MRN: [id number]    Subjective:   Chief Complaint  Patient presents with   Follow-up   Subjective   HPI Heather Roberts presents for office visit today for follow up on back pain and HTN. She reports that her back pain is still present and that she is experiencing bilateral knee stiffness. She notes that her knees get stiff when sitting for extended period of time. For instance, recently she had to sit at home from 6:15 am to 9:45 pm working. She experiences shoulder popping every morning. Denies CP/palp/SOB/HA/congestion/fevers/GI or GU c/o. Taking meds as prescribed.  Takes omeprazole BID depending on what she eats. She reports that her recent blood glucose reading was 129.  Review of Systems  Constitutional:  Negative for chills, fatigue and fever.  HENT:  Negative for congestion, rhinorrhea, sinus pressure, sinus pain and sore throat.   Eyes:  Negative for pain.  Respiratory:  Negative for cough and shortness of breath.   Cardiovascular:  Negative for chest pain, palpitations and leg swelling.  Gastrointestinal:  Negative for abdominal pain, blood in stool, diarrhea, nausea and vomiting.  Genitourinary:  Negative for decreased urine volume, flank pain, frequency, vaginal bleeding and vaginal discharge.  Musculoskeletal:  Positive for arthralgias and back pain.  Neurological:  Negative for headaches.   History Past Medical History:  Diagnosis Date   Allergic rhinitis    Anemia 03/19/2013   Anxiety    Bouchard nodes (DJD hand)    Carotid artery disease ([place]) 03/24/2014   Cervical cancer screening 07/31/2015   Dense breast tissue 03/03/2017   Diabetes mellitus type II    DJD (degenerative joint disease), lumbar    Fibroids    Glaucoma    H/O diverticulitis of colon 03/03/2017   History of DVT (deep vein thrombosis) 1994   Hyperlipidemia    Hypertension    Insomnia 02/03/2016   Nocturia  12/18/2012   Obesity 07/31/2015   Palpitations 12/18/2012    She has a past surgical history that includes Knee arthroscopy (Bilateral); Cholecystectomy; Abdominal hysterectomy; Eye surgery (09/2014, 08/2014); Colonoscopy; Colonoscopy (07/01/2020); and Breast cyst aspiration (Right).   Her family history includes Breast cancer in her sister; COPD in her brother and father; Colon polyps in her son; Diabetes in her mother and paternal grandmother; Kidney disease in her mother; Stroke in her brother, sister, sister, and another family member.She reports that she has quit smoking. Her smoking use included cigarettes. She has a 2.40 pack-year smoking history. She has never used smokeless tobacco. She reports current alcohol use. She reports that she does not use drugs.  Current Outpatient Medications on File Prior to Visit  Medication Sig Dispense Refill   amLODipine (NORVASC) 10 MG tablet TAKE 1 TABLET BY MOUTH EVERY DAY 30 tablet 5   aspirin 81 MG tablet Take 81 mg by mouth daily.       Cyanocobalamin (B-12) 1000 MCG CAPS Take 1 tablet by mouth as needed.      fluticasone (FLONASE) 50 MCG/ACT nasal spray Place 2 sprays into both nostrils daily as needed for allergies or rhinitis. 16 g 6   glucose blood (GLUCOSE METER TEST) test strip To use with onetouch verio flex DX E11.9 100 each 6   glucose blood (ONETOUCH VERIO) test strip Check blood sugar once daily.  DX E11.9 100 each 6   hydrochlorothiazide (HYDRODIURIL) 12.5 MG tablet Take 1 tablet (12.5 mg total)  by mouth daily. 30 tablet 5   hyoscyamine (LEVSIN SL) 0.125 MG SL tablet Place 1 tablet (0.125 mg total) under the tongue every 4 (four) hours as needed (as needed for bloating, abdominal pain). 30 tablet 3   lidocaine (LIDODERM) 5 % Place 1 patch onto the skin daily. Remove & Discard patch within 12 hours or as directed by MD 30 patch 0   losartan (COZAAR) 100 MG tablet Take 1 tablet (100 mg total) by mouth daily. 90 tablet 1   metFORMIN  (GLUCOPHAGE-XR) 500 MG 24 hr tablet Take 1 tablet (500 mg total) by mouth 3 (three) times daily with meals. 270 tablet 1   mometasone (NASONEX) 50 MCG/ACT nasal spray Place 2 sprays into the nose in the morning.      naproxen (NAPROSYN) 375 MG tablet Take 1 tablet (375 mg total) by mouth 2 (two) times daily with a meal. 14 tablet 0   nitroGLYCERIN (NITROSTAT) 0.4 MG SL tablet Place 1 tablet (0.4 mg total) under the tongue every 5 (five) minutes as needed for chest pain. 25 tablet 1   rosuvastatin (CRESTOR) 20 MG tablet TAKE 1 TABLET BY MOUTH EVERYDAY AT BEDTIME 90 tablet 1   tiZANidine (ZANAFLEX) 2 MG tablet Take 0.5-2 tablets (1-4 mg total) by mouth every 8 (eight) hours as needed for muscle spasms. 40 tablet 1   omeprazole (PRILOSEC) 20 MG capsule Take 1 capsule (20 mg total) by mouth 2 (two) times daily before a meal. 60 capsule 1   No current facility-administered medications on file prior to visit.     Objective:  Objective  Physical Exam Constitutional:      General: She is not in acute distress.    Appearance: Normal appearance. She is not ill-appearing or toxic-appearing.  HENT:     Head: Normocephalic and atraumatic.     Right Ear: Tympanic membrane, ear canal and external ear normal.     Left Ear: Tympanic membrane, ear canal and external ear normal.     Nose: No congestion or rhinorrhea.  Eyes:     Extraocular Movements: Extraocular movements intact.     Pupils: Pupils are equal, round, and reactive to light.  Cardiovascular:     Rate and Rhythm: Normal rate and regular rhythm.     Pulses: Normal pulses.     Heart sounds: Normal heart sounds. No murmur heard. Pulmonary:     Effort: Pulmonary effort is normal. No respiratory distress.     Breath sounds: Normal breath sounds. No wheezing, rhonchi or rales.  Abdominal:     General: Bowel sounds are normal.     Palpations: Abdomen is soft. There is no mass.     Tenderness: no abdominal tenderness There is no guarding.      Hernia: No hernia is present.  Musculoskeletal:        General: Normal range of motion.     Cervical back: Normal range of motion and neck supple.  Skin:    General: Skin is warm and dry.  Neurological:     Mental Status: She is alert and oriented to person, place, and time.  Psychiatric:        Behavior: Behavior normal.   BP 132/66   Pulse 82   Temp 98.2 F (36.8 C)   Resp 16   Wt 178 lb (80.7 kg)   SpO2 98%   BMI 33.63 kg/m  Wt Readings from Last 3 Encounters:  06/18/21 178 lb (80.7 kg)  04/21/21 178 lb 6.4  oz (80.9 kg)  12/30/20 174 lb 3.2 oz (79 kg)     Lab Results  Component Value Date   WBC 6.7 04/21/2021   HGB 12.0 04/21/2021   HCT 36.7 04/21/2021   PLT 241.0 04/21/2021   GLUCOSE 146 (H) 04/21/2021   CHOL 130 06/18/2021   TRIG 108.0 06/18/2021   HDL 61.90 06/18/2021   LDLDIRECT 118.5 10/04/2007   LDLCALC 46 06/18/2021   ALT 11 04/21/2021   AST 17 04/21/2021   NA 137 04/21/2021   K 4.4 04/21/2021   CL 102 04/21/2021   CREATININE 1.03 04/21/2021   BUN 23 04/21/2021   CO2 25 04/21/2021   TSH 1.29 04/21/2021   HGBA1C 8.3 (H) 06/18/2021   MICROALBUR 0.50 06/14/2012    US Abdomen Complete  Result Date: 04/24/2021 CLINICAL DATA:  Abdominal pain with radiation to back. EXAM: ABDOMEN ULTRASOUND COMPLETE COMPARISON:  02/15/2017 CT FINDINGS: Gallbladder: Surgically absent Common bile duct: Diameter: Normal, 4 mm. Liver: Mild to moderately increased hepatic echogenicity. Portal vein is patent on color Doppler imaging with normal direction of blood flow towards the liver. IVC: No abnormality visualized. Pancreas: Visualized portion unremarkable. Spleen: Appears diminutive. Likely secondary to the plane of scanning. Normal in appearance on prior CT. Right Kidney: Length: 10.0 cm. Echogenicity within normal limits. No mass or hydronephrosis visualized. Left Kidney: Length: 9.8 cm. Echogenicity within normal limits. No mass or hydronephrosis visualized. Abdominal aorta:  No aneurysm visualized. Other findings: None. IMPRESSION: No acute process or explanation for abdominal pain. Hepatic steatosis. Cholecystectomy. Electronically Signed   By: Abigail Miyamoto M.D.   On: 04/24/2021 16:05     Assessment & Plan:  Plan    No orders of the defined types were placed in this encounter.   Problem List Items Addressed This Visit     Diabetes mellitus type 2 in obese ([place])    hgba1c acceptable, minimize simple carbs. Increase exercise as tolerated. Continue current meds, given flu shot today      Relevant Orders   Hemoglobin A1c (Completed)   Hyperlipidemia associated with type 2 diabetes mellitus ([place])    Tolerating statin, encouraged heart healthy diet, avoid trans fats, minimize simple carbs and saturated fats. Increase exercise as tolerated      Relevant Orders   Lipid panel (Completed)   Essential hypertension - Primary    Well controlled, no changes to meds. Encouraged heart healthy diet such as the DASH diet and exercise as tolerated.       Relevant Orders   Lipid panel (Completed)   Low back pain    Mid to low back pain, Encouraged moist heat and gentle stretching as tolerated. May try NSAIDs and prescription meds as directed and report if symptoms worsen or seek immediate care      Other Visit Diagnoses     Need for influenza vaccination       Relevant Orders   Flu Vaccine QUAD High Dose(Fluad) (Completed)       Follow-up: Return in about 6 months (around 12/16/2021) for annual exam.  I, Suezanne Jacquet, acting as a scribe for [name], MD, have documented all relevent documentation on behalf of [name], MD, as directed by [name], MD while in the presence of [name], MD. DO: 06/18/2021.  I, Mosie Lukes, MD personally performed the services described in this documentation. All medical record entries made by the scribe were at my direction and in my presence. I have reviewed the chart and agree that the  record reflects my  personal performance and is accurate and complete

## 2021-06-18 NOTE — Patient Instructions (Addendum)
Shingrix is the new shingles shot, 2 shots over 2-6 months, confirm coverage with insurance and document, then can return here for shots with nurse appt or at pharmacy Hypertension, Adult High blood pressure (hypertension) is when the force of blood pumping through the arteries is too strong. The arteries are the blood vessels that carry blood from the heart throughout the body. Hypertension forces the heart to work harder to pump blood and may cause arteries to become narrow or stiff. Untreated or uncontrolled hypertension can cause a heart attack, heart failure, a stroke, kidney disease, and other problems. A blood pressure reading consists of a higher number over a lower number. Ideally, your blood pressure should be below 120/80. The first ("top") number is called the systolic pressure. It is a measure of the pressure in your arteries as your heart beats. The second ("bottom") number is called the diastolic pressure. It is a measure of the pressure in your arteries as the heart relaxes. What are the causes? The exact cause of this condition is not known. There are some conditions that result in or are related to high blood pressure. What increases the risk? Some risk factors for high blood pressure are under your control. The following factors may make you more likely to develop this condition: Smoking. Having type 2 diabetes mellitus, high cholesterol, or both. Not getting enough exercise or physical activity. Being overweight. Having too much fat, sugar, calories, or salt (sodium) in your diet. Drinking too much alcohol. Some risk factors for high blood pressure may be difficult or impossible to change. Some of these factors include: Having chronic kidney disease. Having a family history of high blood pressure. Age. Risk increases with age. Race. You may be at higher risk if you are African American. Gender. Men are at higher risk than women before age 37. After age 72, women are at higher  risk than men. Having obstructive sleep apnea. Stress. What are the signs or symptoms? High blood pressure may not cause symptoms. Very high blood pressure (hypertensive crisis) may cause: Headache. Anxiety. Shortness of breath. Nosebleed. Nausea and vomiting. Vision changes. Severe chest pain. Seizures. How is this diagnosed? This condition is diagnosed by measuring your blood pressure while you are seated, with your arm resting on a flat surface, your legs uncrossed, and your feet flat on the floor. The cuff of the blood pressure monitor will be placed directly against the skin of your upper arm at the level of your heart. It should be measured at least twice using the same arm. Certain conditions can cause a difference in blood pressure between your right and left arms. Certain factors can cause blood pressure readings to be lower or higher than normal for a short period of time: When your blood pressure is higher when you are in a health care provider's office than when you are at home, this is called white coat hypertension. Most people with this condition do not need medicines. When your blood pressure is higher at home than when you are in a health care provider's office, this is called masked hypertension. Most people with this condition may need medicines to control blood pressure. If you have a high blood pressure reading during one visit or you have normal blood pressure with other risk factors, you may be asked to: Return on a different day to have your blood pressure checked again. Monitor your blood pressure at home for 1 week or longer. If you are diagnosed with hypertension, you may  have other blood or imaging tests to help your health care provider understand your overall risk for other conditions. How is this treated? This condition is treated by making healthy lifestyle changes, such as eating healthy foods, exercising more, and reducing your alcohol intake. Your health care  provider may prescribe medicine if lifestyle changes are not enough to get your blood pressure under control, and if: Your systolic blood pressure is above 130. Your diastolic blood pressure is above 80. Your personal target blood pressure may vary depending on your medical conditions, your age, and other factors. Follow these instructions at home: Eating and drinking  Eat a diet that is high in fiber and potassium, and low in sodium, added sugar, and fat. An example eating plan is called the DASH (Dietary Approaches to Stop Hypertension) diet. To eat this way: Eat plenty of fresh fruits and vegetables. Try to fill one half of your plate at each meal with fruits and vegetables. Eat whole grains, such as whole-wheat pasta, brown rice, or whole-grain bread. Fill about one fourth of your plate with whole grains. Eat or drink low-fat dairy products, such as skim milk or low-fat yogurt. Avoid fatty cuts of meat, processed or cured meats, and poultry with skin. Fill about one fourth of your plate with lean proteins, such as fish, chicken without skin, beans, eggs, or tofu. Avoid pre-made and processed foods. These tend to be higher in sodium, added sugar, and fat. Reduce your daily sodium intake. Most people with hypertension should eat less than 1,500 mg of sodium a day. Do not drink alcohol if: Your health care provider tells you not to drink. You are pregnant, may be pregnant, or are planning to become pregnant. If you drink alcohol: Limit how much you use to: 0-1 drink a day for women. 0-2 drinks a day for men. Be aware of how much alcohol is in your drink. In the [place] one drink equals one 12 oz bottle of beer (355 mL), one 5 oz glass of wine (148 mL), or one 1 oz glass of hard liquor (44 mL). Lifestyle  Work with your health care provider to maintain a healthy body weight or to lose weight. Ask what an ideal weight is for you. Get at least 30 minutes of exercise most days of the week.  Activities may include walking, swimming, or biking. Include exercise to strengthen your muscles (resistance exercise), such as Pilates or lifting weights, as part of your weekly exercise routine. Try to do these types of exercises for 30 minutes at least 3 days a week. Do not use any products that contain nicotine or tobacco, such as cigarettes, e-cigarettes, and chewing tobacco. If you need help quitting, ask your health care provider. Monitor your blood pressure at home as told by your health care provider. Keep all follow-up visits as told by your health care provider. This is important. Medicines Take over-the-counter and prescription medicines only as told by your health care provider. Follow directions carefully. Blood pressure medicines must be taken as prescribed. Do not skip doses of blood pressure medicine. Doing this puts you at risk for problems and can make the medicine less effective. Ask your health care provider about side effects or reactions to medicines that you should watch for. Contact a health care provider if you: Think you are having a reaction to a medicine you are taking. Have headaches that keep coming back (recurring). Feel dizzy. Have swelling in your ankles. Have trouble with your vision. Get help  right away if you: Develop a severe headache or confusion. Have unusual weakness or numbness. Feel faint. Have severe pain in your chest or abdomen. Vomit repeatedly. Have trouble breathing. Summary Hypertension is when the force of blood pumping through your arteries is too strong. If this condition is not controlled, it may put you at risk for serious complications. Your personal target blood pressure may vary depending on your medical conditions, your age, and other factors. For most people, a normal blood pressure is less than 120/80. Hypertension is treated with lifestyle changes, medicines, or a combination of both. Lifestyle changes include losing weight,  eating a healthy, low-sodium diet, exercising more, and limiting alcohol. This information is not intended to replace advice given to you by your health care provider. Make sure you discuss any questions you have with your health care provider. Document Revised: 06/14/2018 Document Reviewed: 06/14/2018 Elsevier Patient Education  [place].

## 2021-06-19 LAB — LIPID PANEL
Cholesterol: 130 mg/dL (ref 0–200)
HDL: 61.9 mg/dL (ref >39.00)
LDL Cholesterol: 46 mg/dL (ref 0–99)
NonHDL: 68.08
Total CHOL/HDL Ratio: 2
Triglycerides: 108 mg/dL (ref 0.0–149.0)
VLDL: 21.6 mg/dL (ref 0.0–40.0)

## 2021-06-19 LAB — HEMOGLOBIN A1C: Hgb A1c MFr Bld: 8.3 % — ABNORMAL HIGH (ref 4.6–6.5)

## 2021-06-22 NOTE — Assessment & Plan Note (Addendum)
hgba1c acceptable, minimize simple carbs. Increase exercise as tolerated. Continue current meds, given flu shot today

## 2021-06-22 NOTE — Assessment & Plan Note (Signed)
Well controlled, no changes to meds. Encouraged heart healthy diet such as the DASH diet and exercise as tolerated.  °

## 2021-06-22 NOTE — Assessment & Plan Note (Signed)
Mid to low back pain, Encouraged moist heat and gentle stretching as tolerated. May try NSAIDs and prescription meds as directed and report if symptoms worsen or seek immediate care

## 2021-06-22 NOTE — Assessment & Plan Note (Signed)
Tolerating statin, encouraged heart healthy diet, avoid trans fats, minimize simple carbs and saturated fats. Increase exercise as tolerated 

## 2021-06-23 ENCOUNTER — Telehealth: Payer: Self-pay | Admitting: Family Medicine

## 2021-06-23 ENCOUNTER — Other Ambulatory Visit: Payer: Self-pay

## 2021-06-23 DIAGNOSIS — E669 Obesity, unspecified: Secondary | ICD-10-CM

## 2021-06-23 DIAGNOSIS — I10 Essential (primary) hypertension: Secondary | ICD-10-CM

## 2021-06-23 DIAGNOSIS — E785 Hyperlipidemia, unspecified: Secondary | ICD-10-CM

## 2021-06-23 DIAGNOSIS — E1169 Type 2 diabetes mellitus with other specified complication: Secondary | ICD-10-CM

## 2021-06-23 MED ORDER — METFORMIN HCL ER 500 MG PO TB24: 500.0000 mg | ORAL_TABLET | Freq: Three times a day (TID) | ORAL | 1 refills | Status: DC

## 2021-06-23 MED ORDER — METFORMIN HCL ER 500 MG PO TB24
1000.0000 mg | ORAL_TABLET | Freq: Two times a day (BID) | ORAL | 1 refills | Status: DC
Start: 2021-06-23 — End: 2021-08-18

## 2021-06-23 NOTE — Telephone Encounter (Signed)
#  1:Question: Pt. Is also wanting to know if there is medication she can take other than tylenol/advil for her arthritis pain     #2: Medication: metFORMIN (GLUCOPHAGE-XR) 500 MG 24 hr tablet   Has the patient contacted their pharmacy? Yes.   (If no, request that the patient contact the pharmacy for the refill.) (If yes, when and what did the pharmacy advise?)  Preferred Pharmacy (with phone number or street name): [id number] - [place], [place] - [address] AT [address]  [address], [place] [address]  Phone:  [phone]      Agent: Please be advised that RX refills may take up to 3 business days. We ask that you follow-up with your pharmacy.

## 2021-06-24 NOTE — Telephone Encounter (Signed)
Spoke with pt she is aware.  

## 2021-06-30 ENCOUNTER — Other Ambulatory Visit: Payer: Self-pay | Admitting: Family Medicine

## 2021-07-24 ENCOUNTER — Telehealth: Payer: Self-pay | Admitting: Family Medicine

## 2021-07-24 NOTE — Telephone Encounter (Signed)
Pt dropped off document to be filled out by provider (1 page Disability parking placard document) Pt would like to be called when document ready to pick up at [phone]. Document put at front office tray under providers name.

## 2021-07-27 NOTE — Telephone Encounter (Signed)
Paperwork in yellow bin

## 2021-07-28 NOTE — Telephone Encounter (Signed)
LVM to let pt know that paperwork is ready for pick up

## 2021-07-29 NOTE — Telephone Encounter (Signed)
Patient notified that form is ready for pick up.

## 2021-07-29 NOTE — Telephone Encounter (Signed)
Patient came in  Signed and dated  Copy placed in folder

## 2021-07-29 NOTE — Telephone Encounter (Signed)
Caller Name [place] Phone Number [phone] Patient Name Heather Roberts Patient DOB 05/15/1946 Call Type Message Only Information Provided Reason for Call Request for General Office Information Initial Comment caller states she dropped off paperwork with the office last week to get her handicap sticker for her car and needing to if that has been completed Disp. Time Disposition Final User 07/29/2021 7:56:35 AM General Information Provided Yes Kenton Kingfisher, [place]

## 2021-08-10 ENCOUNTER — Telehealth: Payer: Self-pay

## 2021-08-10 ENCOUNTER — Other Ambulatory Visit: Payer: Self-pay | Admitting: Family Medicine

## 2021-08-10 NOTE — Telephone Encounter (Signed)
Patient Name: Heather Roberts Gender: Female DOB: [birth year] Age: 75 Y 1 M 20 D [employer] [facility] - Client Client Site [facility] - Night Physician [name] - MD Contact Type Call Who Is Calling Patient / Member / Family / Caregiver Call Type Triage / Clinical Relationship To Patient Self Return Phone Number Please choose phone number Chief Complaint Prescription Refill or Medication Request (non symptomatic) Reason for Call Symptomatic / Request for [place] states she needs rx nystatin re-done but has to call [facility] Translation No Disp. Time (Eastern[place]) Disposition Final User 08/10/2021 7:36:53 AM Attempt made - message left Clovis Riley RNGeorgina Peer 08/10/2021 7:54:29 AM Attempt made - message left Clovis Riley, RN, Georgina Peer 08/10/2021 8:05:56 AM FINAL ATTEMPT MADE - message left Yes Clovis Riley, RN, [name]

## 2021-08-10 NOTE — Telephone Encounter (Signed)
Lvm to call back

## 2021-08-12 NOTE — Telephone Encounter (Signed)
Pt. Returning call: informed she will be contacted back

## 2021-08-13 ENCOUNTER — Other Ambulatory Visit: Payer: Self-pay | Admitting: *Deleted

## 2021-08-13 MED ORDER — ROSUVASTATIN CALCIUM 20 MG PO TABS: 20.0000 mg | ORAL_TABLET | Freq: Every evening | ORAL | 1 refills | Status: DC

## 2021-08-13 NOTE — Telephone Encounter (Signed)
Patient was needing rosuvastatin, not nystatin.  Rx sent in to [facility].

## 2021-08-18 ENCOUNTER — Other Ambulatory Visit: Payer: Self-pay | Admitting: *Deleted

## 2021-08-18 DIAGNOSIS — I10 Essential (primary) hypertension: Secondary | ICD-10-CM

## 2021-08-18 DIAGNOSIS — E1169 Type 2 diabetes mellitus with other specified complication: Secondary | ICD-10-CM

## 2021-08-18 MED ORDER — TRUE METRIX LEVEL 1 LOW VI SOLN: 0 refills | Status: AC

## 2021-08-18 MED ORDER — METFORMIN HCL ER 500 MG PO TB24: 1000.0000 mg | ORAL_TABLET | Freq: Two times a day (BID) | ORAL | 1 refills | Status: DC

## 2021-08-18 MED ORDER — ALCOHOL SWABS 70 % PADS: MEDICATED_PAD | 1 refills | Status: AC

## 2021-08-18 MED ORDER — TRUE METRIX AIR GLUCOSE METER W/DEVICE KIT: PACK | 0 refills | Status: AC

## 2021-08-18 MED ORDER — TRUE METRIX BLOOD GLUCOSE TEST VI STRP: ORAL_STRIP | 1 refills | Status: AC

## 2021-08-18 MED ORDER — TRUEPLUS LANCETS 33G MISC: 1 refills | Status: AC

## 2021-08-18 MED ORDER — ROSUVASTATIN CALCIUM 20 MG PO TABS: 20.0000 mg | ORAL_TABLET | Freq: Every evening | ORAL | 1 refills | Status: DC

## 2021-08-18 MED ORDER — AMLODIPINE BESYLATE 10 MG PO TABS: 10.0000 mg | ORAL_TABLET | Freq: Every day | ORAL | 1 refills | Status: AC

## 2021-09-10 ENCOUNTER — Emergency Department (HOSPITAL_BASED_OUTPATIENT_CLINIC_OR_DEPARTMENT_OTHER)
Admission: EM | Admit: 2021-09-10 | Discharge: 2021-09-10 | Disposition: A | Discharge status: Home / Self Care | Payer: Medicare Other | Attending: Emergency Medicine | Admitting: Emergency Medicine

## 2021-09-10 ENCOUNTER — Encounter (HOSPITAL_BASED_OUTPATIENT_CLINIC_OR_DEPARTMENT_OTHER): Payer: Self-pay | Admitting: *Deleted

## 2021-09-10 ENCOUNTER — Other Ambulatory Visit: Payer: Self-pay

## 2021-09-10 DIAGNOSIS — S3992XA Unspecified injury of lower back, initial encounter: Secondary | ICD-10-CM | POA: Diagnosis present

## 2021-09-10 DIAGNOSIS — Y9389 Activity, other specified: Secondary | ICD-10-CM | POA: Insufficient documentation

## 2021-09-10 DIAGNOSIS — Z7984 Long term (current) use of oral hypoglycemic drugs: Secondary | ICD-10-CM | POA: Insufficient documentation

## 2021-09-10 DIAGNOSIS — I10 Essential (primary) hypertension: Secondary | ICD-10-CM | POA: Diagnosis not present

## 2021-09-10 DIAGNOSIS — S39012A Strain of muscle, fascia and tendon of lower back, initial encounter: Secondary | ICD-10-CM | POA: Insufficient documentation

## 2021-09-10 DIAGNOSIS — X501XXA Overexertion from prolonged static or awkward postures, initial encounter: Secondary | ICD-10-CM | POA: Diagnosis not present

## 2021-09-10 DIAGNOSIS — T148XXA Other injury of unspecified body region, initial encounter: Secondary | ICD-10-CM

## 2021-09-10 DIAGNOSIS — Z87891 Personal history of nicotine dependence: Secondary | ICD-10-CM | POA: Diagnosis not present

## 2021-09-10 DIAGNOSIS — Z79899 Other long term (current) drug therapy: Secondary | ICD-10-CM | POA: Insufficient documentation

## 2021-09-10 DIAGNOSIS — I2510 Atherosclerotic heart disease of native coronary artery without angina pectoris: Secondary | ICD-10-CM | POA: Insufficient documentation

## 2021-09-10 DIAGNOSIS — Y9289 Other specified places as the place of occurrence of the external cause: Secondary | ICD-10-CM | POA: Insufficient documentation

## 2021-09-10 DIAGNOSIS — E1169 Type 2 diabetes mellitus with other specified complication: Secondary | ICD-10-CM | POA: Insufficient documentation

## 2021-09-10 LAB — URINALYSIS, MICROSCOPIC (REFLEX)

## 2021-09-10 LAB — URINALYSIS, ROUTINE W REFLEX MICROSCOPIC
Bilirubin Urine: NEGATIVE
Glucose, UA: NEGATIVE mg/dL
Ketones, ur: NEGATIVE mg/dL
Leukocytes,Ua: NEGATIVE
Nitrite: NEGATIVE
Protein, ur: NEGATIVE mg/dL
Specific Gravity, Urine: 1.01 (ref 1.005–1.030)
pH: 6 (ref 5.0–8.0)

## 2021-09-10 MED ORDER — METHOCARBAMOL 500 MG PO TABS: 500.0000 mg | ORAL_TABLET | Freq: Two times a day (BID) | ORAL | 0 refills | Status: AC

## 2021-09-10 MED ORDER — DIAZEPAM 2 MG PO TABS
2.0000 mg | ORAL_TABLET | Freq: Once | ORAL | Status: AC
  Administered 2021-09-10: 2 mg via ORAL
  Filled 2021-09-10: qty 1

## 2021-09-10 MED ORDER — LIDOCAINE 5 % EX PTCH
1.0000 | MEDICATED_PATCH | CUTANEOUS | Status: DC
  Administered 2021-09-10: 1 via TRANSDERMAL
  Filled 2021-09-10: qty 1

## 2021-09-10 MED ORDER — NAPROXEN 500 MG PO TABS: 500.0000 mg | ORAL_TABLET | Freq: Two times a day (BID) | ORAL | 0 refills | Status: AC

## 2021-09-10 MED ORDER — NAPROXEN 250 MG PO TABS
500.0000 mg | ORAL_TABLET | Freq: Once | ORAL | Status: AC
  Administered 2021-09-10: 500 mg via ORAL
  Filled 2021-09-10: qty 2

## 2021-09-10 MED ORDER — LIDOCAINE 5 % EX PTCH
1.0000 | MEDICATED_PATCH | CUTANEOUS | 0 refills | Status: AC
Start: 2021-09-10 — End: 2021-09-15

## 2021-09-10 NOTE — Discharge Instructions (Addendum)
The results of your urine analysis were within normal limits.  I have prescribed 3 medications.  The first 1 being a muscle relaxer called Robaxin.  You will need to take 1 tablet twice a day for the next 7 days.  Please be aware this medication can make you drowsy, do not drink alcohol, drive while taking this medication.  The second medication being naproxen, please take 1 tablet twice a day for the next 7 days with food.  The third medication is lidocaine patches, this should be applied to your back in a 24-hour.  These follow-up with your primary care physician in 1 week for reevaluation of symptoms.  If you experience any worsening symptoms, nausea, vomiting you will need to return to the emergency department.

## 2021-09-10 NOTE — ED Triage Notes (Signed)
3 days ago she slipped off a step while carrying a load of clothes in a clothes basket. Aching pain in her left flank into her lower left abdomen. She feels she pulled a muscle. States she did not fall.

## 2021-09-10 NOTE — ED Provider Notes (Signed)
[facility] Provider Note   CSN: [id number] Arrival date & time: 09/10/21  1113     History No chief complaint on file.   Heather Roberts is a 75 y.o. female.  75 y.o female with a past medical history of diabetes, hypertension, CAD presents to the ED with a chief complaint of left back pain status post injury yesterday.  Patient reports she was walking down the steps carrying Apparently, when she suddenly stepped off onto the steps, causing her to drop clothes basket.  She reports feeling a pulling sensation from her left flank and now radiates into her left abdomen.  She reports taking 1 Advil, she did not have with her symptoms.  Reports the pain feels worse today, she states it is exacerbated with any movement.  Pain occurred 4 to 5 hours after this incident.  He did not fall, did not lose consciousness, did not have any other injury.  Denies any urinary symptoms, nausea, vomiting.  No sick contacts.     The history is provided by the patient.      Past Medical History:  Diagnosis Date   Allergic rhinitis    Anemia 03/19/2013   Anxiety    Bouchard nodes (DJD hand)    Carotid artery disease ([place]) 03/24/2014   Cervical cancer screening 07/31/2015   Dense breast tissue 03/03/2017   Diabetes mellitus type II    DJD (degenerative joint disease), lumbar    Fibroids    Glaucoma    H/O diverticulitis of colon 03/03/2017   History of DVT (deep vein thrombosis) 1994   Hyperlipidemia    Hypertension    Insomnia 02/03/2016   Nocturia 12/18/2012   Obesity 07/31/2015   Palpitations 12/18/2012    Patient Active Problem List   Diagnosis Date Noted   Gastritis and gastroduodenitis 04/21/2021   Epigastric abdominal tenderness without rebound tenderness 04/21/2021   Dysphagia 11/15/2019   Tendinitis of right rotator cuff 06/29/2019   Pedal edema 04/09/2019   Right hand pain 12/06/2017   Headache 12/06/2017   Dense breast tissue 03/03/2017   H/O  diverticulitis of colon 03/03/2017   Insomnia 02/03/2016   Right knee pain 01/01/2016   Metatarsalgia of right foot 01/01/2016   Cervical cancer screening 07/31/2015   Obesity 07/31/2015   Medicare annual wellness visit, subsequent 01/26/2015   Sinusitis, acute maxillary 09/09/2014   Carotid artery disease ([place]) 03/24/2014   Anemia 03/19/2013   Cerebrovascular disease 02/21/2013   Chest pain 01/10/2013   Palpitations 12/18/2012   Nocturia 12/18/2012   Joint pain 09/23/2012   Osteoporosis screening 10/29/2011   Low back pain 07/16/2011   Bursitis, trochanteric 07/11/2011   Left knee pain 10/01/2010   CATARACTS 09/07/2010   ECZEMA 08/21/2010   Pain in joint, lower leg 08/21/2010   Constipation 09/08/2009   Anxiety state 08/11/2008   Diabetes mellitus type 2 in obese ([place]) 08/09/2008   MENOPAUSAL DISORDER 08/09/2008   BACK PAIN 08/09/2008   Shoulder pain 08/09/2008   Allergic rhinitis 02/08/2008   Hyperlipidemia associated with type 2 diabetes mellitus ([place]) 08/08/2007   Essential hypertension 08/08/2007   DVT, HX OF 08/08/2007   Osteoarthrosis, hand 08/07/2007   Osteoarthrosis, unspecified whether generalized or localized, involving lower leg 08/07/2007   Osteoarthritis of spine 08/07/2007   ARTHROSCOPY, KNEE, HX OF 08/07/2007    Past Surgical History:  Procedure Laterality Date   ABDOMINAL HYSTERECTOMY     BREAST CYST ASPIRATION Right    CHOLECYSTECTOMY  COLONOSCOPY     approx 8 years ago   COLONOSCOPY  07/01/2020   EYE SURGERY  09/2014, 08/2014   Cataracts removal   KNEE ARTHROSCOPY Bilateral    Left 2007, Right 1999     OB History     Gravida  3   Para  3   Term  3   Preterm      AB      Living  3      SAB      IAB      Ectopic      Multiple      Live Births              Family History  Problem Relation Age of Onset   Stroke Sister    Diabetes Mother    Kidney disease Mother    COPD Father        smoker   Colon polyps Son     Stroke Sister    Breast cancer Sister        s/p mastectomy   COPD Brother    Stroke Brother    Stroke Other    Diabetes Paternal Grandmother    Colon cancer Neg Hx    Liver cancer Neg Hx     Social History   Tobacco Use   Smoking status: Former    Packs/day: 0.30    Years: 8.00    Pack years: 2.40    Types: Cigarettes   Smokeless tobacco: Never  Vaping Use   Vaping Use: Never used  Substance Use Topics   Alcohol use: Yes    Alcohol/week: 0.0 standard drinks    Comment: Rarely---glass of wine with a meeting   Drug use: No    Home Medications Prior to Admission medications   Medication Sig Start Date End Date Taking? Authorizing Provider  lidocaine (LIDODERM) 5 % Place 1 patch onto the skin daily for 5 days. Remove & Discard patch within 12 hours or as directed by MD 09/10/21 09/15/21 Yes Allice Garro, Beverley Fiedler, PA-C  methocarbamol (ROBAXIN) 500 MG tablet Take 1 tablet (500 mg total) by mouth 2 (two) times daily for 7 days. 09/10/21 09/17/21 Yes Thao Bauza, PA-C  naproxen (NAPROSYN) 500 MG tablet Take 1 tablet (500 mg total) by mouth 2 (two) times daily for 7 days. 09/10/21 09/17/21 Yes Janeece Fitting, PA-C  Alcohol Swabs 70 % PADS Use as directed 08/18/21   Mosie Lukes, MD  amLODipine (NORVASC) 10 MG tablet Take 1 tablet (10 mg total) by mouth daily. 08/18/21   Mosie Lukes, MD  aspirin 81 MG tablet Take 81 mg by mouth daily.      [provider]  Blood Glucose Calibration (TRUE METRIX LEVEL 1) Low SOLN Use as directed 08/18/21   Mosie Lukes, MD  Blood Glucose Monitoring Suppl (TRUE METRIX AIR GLUCOSE METER) w/Device KIT Use to check sugar once a day.  Dx code: E11.9 08/18/21   Mosie Lukes, MD  Cyanocobalamin (B-12) 1000 MCG CAPS Take 1 tablet by mouth as needed.     [provider]  fluticasone (FLONASE) 50 MCG/ACT nasal spray Place 2 sprays into both nostrils daily as needed for allergies or rhinitis. 12/17/13   Mosie Lukes, MD  glucose blood (TRUE  METRIX BLOOD GLUCOSE TEST) test strip Use to check sugar once a day.  Dx code: E11.9 08/18/21   Mosie Lukes, MD  hydrochlorothiazide (HYDRODIURIL) 12.5 MG tablet Take 1 tablet (12.5 mg total)  by mouth daily. 06/06/17   Mosie Lukes, MD  hyoscyamine (LEVSIN SL) 0.125 MG SL tablet Place 1 tablet (0.125 mg total) under the tongue every 4 (four) hours as needed (as needed for bloating, abdominal pain). 05/06/20   Cirigliano, Vito V, DO  losartan (COZAAR) 100 MG tablet Take 1 tablet (100 mg total) by mouth daily. 04/10/18   Mosie Lukes, MD  metFORMIN (GLUCOPHAGE-XR) 500 MG 24 hr tablet Take 2 tablets (1,000 mg total) by mouth 2 (two) times daily. 08/18/21   Mosie Lukes, MD  mometasone (NASONEX) 50 MCG/ACT nasal spray Place 2 sprays into the nose in the morning.     [provider]  nitroGLYCERIN (NITROSTAT) 0.4 MG SL tablet Place 1 tablet (0.4 mg total) under the tongue every 5 (five) minutes as needed for chest pain. 05/08/20   Mosie Lukes, MD  omeprazole (PRILOSEC) 20 MG capsule Take 1 capsule (20 mg total) by mouth 2 (two) times daily before a meal. 04/21/21 06/02/21  Carollee Herter, Alferd Apa, DO  rosuvastatin (CRESTOR) 20 MG tablet Take 1 tablet (20 mg total) by mouth at bedtime. 08/18/21   Mosie Lukes, MD  tiZANidine (ZANAFLEX) 2 MG tablet Take 0.5-2 tablets (1-4 mg total) by mouth every 8 (eight) hours as needed for muscle spasms. 09/16/20   Mosie Lukes, MD  TRUEplus Lancets 33G MISC Use to check sugar once a day.  Dx code: E11.9 08/18/21   Mosie Lukes, MD    Allergies    Tussionex pennkinetic er Jerene Canny polst-cpm polst er]  Review of Systems   Review of Systems  Constitutional:  Negative for fever.  Respiratory:  Negative for shortness of breath.   Cardiovascular:  Negative for chest pain.  Gastrointestinal:  Negative for abdominal pain, nausea and vomiting.  Genitourinary:  Negative for difficulty urinating and dysuria.  Musculoskeletal:  Positive for back pain  and myalgias. Negative for neck pain.   Physical Exam Updated Vital Signs BP (!) 151/75 (BP Location: Right Arm)   Pulse 68   Temp (!) 97.5 F (36.4 C) (Oral)   Resp 18   Ht _0  (1.549 m)   Wt 80.7 kg   SpO2 100%   BMI 33.62 kg/m   Physical Exam Vitals and nursing note reviewed.  Constitutional:      Appearance: Normal appearance.  HENT:     Head: Atraumatic.     Mouth/Throat:     Mouth: Mucous membranes are moist.  Eyes:     Pupils: Pupils are equal, round, and reactive to light.  Cardiovascular:     Rate and Rhythm: Normal rate.  Pulmonary:     Effort: Pulmonary effort is normal.  Abdominal:     General: Abdomen is flat.  Musculoskeletal:     Cervical back: Normal range of motion and neck supple.       Back:  Skin:    General: Skin is warm and dry.  Neurological:     Mental Status: She is alert and oriented to person, place, and time.     Comments: Moves all upper and lower extremities, good strength with leg extension along with flexion.  Ambulatory in the ED with a steady gait.    ED Results / Procedures / Treatments   Labs (all labs ordered are listed, but only abnormal results are displayed) Labs Reviewed  URINALYSIS, ROUTINE W REFLEX MICROSCOPIC - Abnormal; Notable for the following components:      Result Value   Hgb  urine dipstick TRACE (*)    All other components within normal limits  URINALYSIS, MICROSCOPIC (REFLEX) - Abnormal; Notable for the following components:   Bacteria, UA RARE (*)    All other components within normal limits    EKG None  Radiology No results found.  Procedures Procedures   Medications Ordered in ED Medications  lidocaine (LIDODERM) 5 % 1 patch (1 patch Transdermal Patch Applied 09/10/21 1244)  diazepam (VALIUM) tablet 2 mg (2 mg Oral Given 09/10/21 1213)  naproxen (NAPROSYN) tablet 500 mg (500 mg Oral Given 09/10/21 1213)    ED Course  I have reviewed the triage vital signs and the nursing  notes.  Pertinent labs & imaging results that were available during my care of the patient were reviewed by me and considered in my medical decision making (see chart for details).  Clinical Course as of 09/10/21 1252  Thu Sep 10, 2021  1242 Bacteria, UA(!): RARE [JS]    Clinical Course User Index [JS] Janeece Fitting, PA-C   MDM Rules/Calculators/A&P    Patient presents to the ED status post injury while ambulating down the stairs and holding a basket of clothes.  Reports she missed a step causing her to have a pulling sensation to her left lumbar spine.  The pain is exacerbated with movement, it developed 4 to 5 hours after the incident.  There was no fall, no injury, no hematoma or rashes present.  Normal evaluation she is ambulatory in the ED with a steady gait, has good upper and lower strength to all extremities.  Pain with palpation along the left CVA, some concern for kidney involvement however she denies any urinary symptoms, will obtain UA at this time.  Given muscle relaxers along with anti-inflammatories and lidocaine patches will also treat for MSK.  UA is without any signs of infection, some blood noted but she has no urinary symptoms at this time.  I do feel that pain is most likely coming from MSK component, given muscle relaxer, anti-inflammatories will in the ED with some improvement in her symptoms.  Discussed symptomatic treatment at home, she will need to return if symptoms do not improve.  She is agreeable to plan and management, patient stable for discharge.  Portions of this note were generated with [employer]. Dictation errors may occur despite best attempts at proofreading.  Final Clinical Impression(s) / ED Diagnoses Final diagnoses:  Muscle strain    Rx / DC Orders ED Discharge Orders          Ordered    naproxen (NAPROSYN) 500 MG tablet  2 times daily        09/10/21 1251    lidocaine (LIDODERM) 5 %  Every 24 hours        09/10/21 1251     methocarbamol (ROBAXIN) 500 MG tablet  2 times daily        09/10/21 1251             Janeece Fitting, PA-C 09/10/21 1252    Wynona Dove A, DO 09/10/21 1748

## 2021-09-15 ENCOUNTER — Telehealth: Payer: Self-pay | Admitting: Family Medicine

## 2021-09-15 NOTE — Telephone Encounter (Signed)
Fyi.

## 2021-09-15 NOTE — Telephone Encounter (Signed)
Medication:  amLODipine (NORVASC) 10 MG tablet [[id number]]      Has the patient contacted their pharmacy? No. (If no, request that the patient contact the pharmacy for the refill.) (If yes, when and what did the pharmacy advise?)     Preferred Pharmacy (with phone number or street name):  [id number] - [place], [place] - [address] AT [address]  [address], [place] [address]  Phone:  [phone]  Fax:  [phone]   Agent: Please be advised that RX refills may take up to 3 business days. We ask that you follow-up with your pharmacy.

## 2021-09-15 NOTE — Telephone Encounter (Signed)
Pt recently visited the ed on 11/24 and would like to inform Dr. Charlett Blake that she is doing better.

## 2021-09-15 NOTE — Telephone Encounter (Signed)
Mediation refill too soon

## 2021-09-23 ENCOUNTER — Other Ambulatory Visit: Payer: Medicare Other

## 2021-12-03 DIAGNOSIS — I700 Atherosclerosis of aorta: Secondary | ICD-10-CM | POA: Diagnosis not present

## 2021-12-03 DIAGNOSIS — Z86718 Personal history of other venous thrombosis and embolism: Secondary | ICD-10-CM | POA: Diagnosis not present

## 2021-12-03 DIAGNOSIS — E785 Hyperlipidemia, unspecified: Secondary | ICD-10-CM | POA: Diagnosis not present

## 2021-12-03 DIAGNOSIS — D8481 Immunodeficiency due to conditions classified elsewhere: Secondary | ICD-10-CM | POA: Diagnosis not present

## 2021-12-03 DIAGNOSIS — N1831 Chronic kidney disease, stage 3a: Secondary | ICD-10-CM | POA: Diagnosis not present

## 2021-12-03 DIAGNOSIS — F419 Anxiety disorder, unspecified: Secondary | ICD-10-CM | POA: Diagnosis not present

## 2021-12-03 DIAGNOSIS — I129 Hypertensive chronic kidney disease with stage 1 through stage 4 chronic kidney disease, or unspecified chronic kidney disease: Secondary | ICD-10-CM | POA: Diagnosis not present

## 2021-12-03 DIAGNOSIS — Z0000 Encounter for general adult medical examination without abnormal findings: Secondary | ICD-10-CM | POA: Diagnosis not present

## 2021-12-03 DIAGNOSIS — Z7984 Long term (current) use of oral hypoglycemic drugs: Secondary | ICD-10-CM | POA: Diagnosis not present

## 2021-12-03 DIAGNOSIS — E1122 Type 2 diabetes mellitus with diabetic chronic kidney disease: Secondary | ICD-10-CM | POA: Diagnosis not present

## 2021-12-03 DIAGNOSIS — I10 Essential (primary) hypertension: Secondary | ICD-10-CM | POA: Diagnosis not present

## 2021-12-03 DIAGNOSIS — E1169 Type 2 diabetes mellitus with other specified complication: Secondary | ICD-10-CM | POA: Diagnosis not present

## 2021-12-03 DIAGNOSIS — H409 Unspecified glaucoma: Secondary | ICD-10-CM | POA: Diagnosis not present

## 2021-12-09 ENCOUNTER — Telehealth: Payer: Self-pay | Admitting: Family Medicine

## 2021-12-09 NOTE — Telephone Encounter (Signed)
Left message for patient to call back and schedule Medicare Annual Wellness Visit (AWV) in office.   If not able to come in office, please offer to do virtually or by telephone.  Left office number and my jabber [phone].  Last AWV:08/30/2019  Please schedule at anytime with Nurse Health Advisor.

## 2021-12-18 ENCOUNTER — Other Ambulatory Visit (HOSPITAL_BASED_OUTPATIENT_CLINIC_OR_DEPARTMENT_OTHER): Payer: Self-pay | Admitting: Family Medicine

## 2021-12-18 DIAGNOSIS — Z1231 Encounter for screening mammogram for malignant neoplasm of breast: Secondary | ICD-10-CM

## 2021-12-23 ENCOUNTER — Ambulatory Visit (HOSPITAL_BASED_OUTPATIENT_CLINIC_OR_DEPARTMENT_OTHER): Payer: Medicare PPO

## 2021-12-29 ENCOUNTER — Telehealth: Payer: Self-pay | Admitting: Family Medicine

## 2021-12-29 NOTE — Telephone Encounter (Signed)
Left message for patient to call back and schedule Medicare Annual Wellness Visit (AWV) in office.  ? ?If not able to come in office, please offer to do virtually or by telephone.  Left office number and my jabber [phone]. ? ?Last AWV:08/30/2019 ? ?Please schedule at anytime with Nurse Health Advisor. ?  ?

## 2021-12-29 NOTE — Telephone Encounter (Signed)
Patient returned my call about scheduling AWV. Patient said she's seeing a new PCP. Please remove PCP. ?

## 2022-01-05 ENCOUNTER — Encounter: Payer: Medicare Other | Admitting: Family Medicine

## 2022-01-08 ENCOUNTER — Other Ambulatory Visit: Payer: Self-pay | Admitting: Family Medicine

## 2022-01-08 DIAGNOSIS — E1169 Type 2 diabetes mellitus with other specified complication: Secondary | ICD-10-CM

## 2022-01-13 DIAGNOSIS — Z1231 Encounter for screening mammogram for malignant neoplasm of breast: Secondary | ICD-10-CM | POA: Diagnosis not present

## 2022-01-14 DIAGNOSIS — H26493 Other secondary cataract, bilateral: Secondary | ICD-10-CM | POA: Diagnosis not present

## 2022-01-14 DIAGNOSIS — H16223 Keratoconjunctivitis sicca, not specified as Sjogren's, bilateral: Secondary | ICD-10-CM | POA: Diagnosis not present

## 2022-01-14 DIAGNOSIS — E119 Type 2 diabetes mellitus without complications: Secondary | ICD-10-CM | POA: Diagnosis not present

## 2022-02-10 DIAGNOSIS — M79641 Pain in right hand: Secondary | ICD-10-CM | POA: Diagnosis not present

## 2022-02-10 DIAGNOSIS — M1811 Unilateral primary osteoarthritis of first carpometacarpal joint, right hand: Secondary | ICD-10-CM | POA: Diagnosis not present

## 2022-02-15 DIAGNOSIS — H16223 Keratoconjunctivitis sicca, not specified as Sjogren's, bilateral: Secondary | ICD-10-CM | POA: Diagnosis not present

## 2022-03-04 DIAGNOSIS — Z7982 Long term (current) use of aspirin: Secondary | ICD-10-CM | POA: Diagnosis not present

## 2022-03-04 DIAGNOSIS — M6530 Trigger finger, unspecified finger: Secondary | ICD-10-CM | POA: Diagnosis not present

## 2022-03-04 DIAGNOSIS — L309 Dermatitis, unspecified: Secondary | ICD-10-CM | POA: Diagnosis not present

## 2022-03-04 DIAGNOSIS — E1122 Type 2 diabetes mellitus with diabetic chronic kidney disease: Secondary | ICD-10-CM | POA: Diagnosis not present

## 2022-03-04 DIAGNOSIS — N183 Chronic kidney disease, stage 3 unspecified: Secondary | ICD-10-CM | POA: Diagnosis not present

## 2022-03-04 DIAGNOSIS — E1169 Type 2 diabetes mellitus with other specified complication: Secondary | ICD-10-CM | POA: Diagnosis not present

## 2022-03-04 DIAGNOSIS — Z7984 Long term (current) use of oral hypoglycemic drugs: Secondary | ICD-10-CM | POA: Diagnosis not present

## 2022-03-17 ENCOUNTER — Other Ambulatory Visit: Payer: Self-pay | Admitting: Family Medicine

## 2022-03-17 DIAGNOSIS — I10 Essential (primary) hypertension: Secondary | ICD-10-CM

## 2022-09-22 ENCOUNTER — Other Ambulatory Visit: Payer: Self-pay | Admitting: Family Medicine

## 2022-12-21 ENCOUNTER — Other Ambulatory Visit: Payer: Self-pay | Admitting: Family Medicine

## 2022-12-21 DIAGNOSIS — E1169 Type 2 diabetes mellitus with other specified complication: Secondary | ICD-10-CM

## 2023-02-03 ENCOUNTER — Encounter: Payer: Self-pay | Admitting: *Deleted

## 2023-03-30 ENCOUNTER — Other Ambulatory Visit: Payer: Self-pay | Admitting: Family Medicine

## 2023-03-30 DIAGNOSIS — E1169 Type 2 diabetes mellitus with other specified complication: Secondary | ICD-10-CM
